# Patient Record
Sex: Female | Born: 1990 | Race: Black or African American | Hispanic: No | Marital: Single | Smoking: Current every day smoker
Health system: Southern US, Community
[De-identification: ages and names within clinical notes are randomized; demographics above are authoritative.]

## PROBLEM LIST (undated history)

## (undated) ENCOUNTER — Inpatient Hospital Stay (HOSPITAL_COMMUNITY): Payer: Self-pay

## (undated) ENCOUNTER — Inpatient Hospital Stay: Payer: Self-pay

## (undated) DIAGNOSIS — F329 Major depressive disorder, single episode, unspecified: Secondary | ICD-10-CM

## (undated) DIAGNOSIS — D649 Anemia, unspecified: Secondary | ICD-10-CM

## (undated) DIAGNOSIS — J45909 Unspecified asthma, uncomplicated: Secondary | ICD-10-CM

## (undated) DIAGNOSIS — F419 Anxiety disorder, unspecified: Secondary | ICD-10-CM

## (undated) DIAGNOSIS — L309 Dermatitis, unspecified: Secondary | ICD-10-CM

## (undated) DIAGNOSIS — F32A Depression, unspecified: Secondary | ICD-10-CM

## (undated) DIAGNOSIS — R519 Headache, unspecified: Secondary | ICD-10-CM

## (undated) DIAGNOSIS — D219 Benign neoplasm of connective and other soft tissue, unspecified: Secondary | ICD-10-CM

## (undated) HISTORY — PX: TONSILLECTOMY AND ADENOIDECTOMY: SUR1326

---
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
## 2011-02-24 ENCOUNTER — Emergency Department: Payer: Self-pay | Admitting: Emergency Medicine

## 2011-07-12 ENCOUNTER — Emergency Department: Payer: Self-pay | Admitting: Emergency Medicine

## 2011-09-10 ENCOUNTER — Emergency Department: Payer: Self-pay | Admitting: *Deleted

## 2011-09-10 LAB — PREGNANCY, URINE: Pregnancy Test, Urine: NEGATIVE m[IU]/mL

## 2013-11-08 ENCOUNTER — Emergency Department: Payer: Self-pay | Admitting: Emergency Medicine

## 2013-11-08 LAB — CBC WITH DIFFERENTIAL/PLATELET
BASOS ABS: 0.1 10*3/uL (ref 0.0–0.1)
BASOS PCT: 0.6 %
Eosinophil #: 0.3 10*3/uL (ref 0.0–0.7)
Eosinophil %: 2.5 %
HCT: 45.6 % (ref 35.0–47.0)
HGB: 14.8 g/dL (ref 12.0–16.0)
LYMPHS PCT: 33.8 %
Lymphocyte #: 3.5 10*3/uL (ref 1.0–3.6)
MCH: 31.2 pg (ref 26.0–34.0)
MCHC: 32.5 g/dL (ref 32.0–36.0)
MCV: 96 fL (ref 80–100)
MONOS PCT: 6.8 %
Monocyte #: 0.7 x10 3/mm (ref 0.2–0.9)
NEUTROS PCT: 56.3 %
Neutrophil #: 5.8 10*3/uL (ref 1.4–6.5)
Platelet: 259 10*3/uL (ref 150–440)
RBC: 4.76 10*6/uL (ref 3.80–5.20)
RDW: 13.8 % (ref 11.5–14.5)
WBC: 10.4 10*3/uL (ref 3.6–11.0)

## 2013-11-08 LAB — URINALYSIS, COMPLETE
BLOOD: NEGATIVE
Bacteria: NONE SEEN
Bilirubin,UR: NEGATIVE
Glucose,UR: NEGATIVE mg/dL (ref 0–75)
Ketone: NEGATIVE
LEUKOCYTE ESTERASE: NEGATIVE
Nitrite: NEGATIVE
Ph: 8 (ref 4.5–8.0)
Protein: NEGATIVE
RBC, UR: NONE SEEN /HPF (ref 0–5)
SPECIFIC GRAVITY: 1.023 (ref 1.003–1.030)
Squamous Epithelial: 4
WBC UR: <1 /HPF (ref 0–5)

## 2013-11-08 LAB — COMPREHENSIVE METABOLIC PANEL
ANION GAP: 4 — AB (ref 7–16)
Albumin: 3.9 g/dL (ref 3.4–5.0)
Alkaline Phosphatase: 112 U/L
BILIRUBIN TOTAL: 0.3 mg/dL (ref 0.2–1.0)
BUN: 20 mg/dL — ABNORMAL HIGH (ref 7–18)
CREATININE: 0.96 mg/dL (ref 0.60–1.30)
Calcium, Total: 8.8 mg/dL (ref 8.5–10.1)
Chloride: 106 mmol/L (ref 98–107)
Co2: 28 mmol/L (ref 21–32)
EGFR (African American): >60
Glucose: 97 mg/dL (ref 65–99)
Osmolality: 278 (ref 275–301)
Potassium: 3.8 mmol/L (ref 3.5–5.1)
SGOT(AST): 19 U/L (ref 15–37)
SGPT (ALT): 18 U/L (ref 12–78)
Sodium: 138 mmol/L (ref 136–145)
TOTAL PROTEIN: 8.4 g/dL — AB (ref 6.4–8.2)

## 2014-01-29 ENCOUNTER — Emergency Department: Payer: Self-pay | Admitting: Emergency Medicine

## 2014-02-13 ENCOUNTER — Emergency Department: Payer: Self-pay | Admitting: Emergency Medicine

## 2014-03-02 ENCOUNTER — Emergency Department: Payer: Self-pay | Admitting: Emergency Medicine

## 2014-03-02 LAB — COMPREHENSIVE METABOLIC PANEL
Albumin: 3.4 g/dL (ref 3.4–5.0)
Alkaline Phosphatase: 54 U/L
Anion Gap: 13 (ref 7–16)
BILIRUBIN TOTAL: 0.7 mg/dL (ref 0.2–1.0)
BUN: 8 mg/dL (ref 7–18)
Calcium, Total: 8.6 mg/dL (ref 8.5–10.1)
Chloride: 102 mmol/L (ref 98–107)
Co2: 21 mmol/L (ref 21–32)
Creatinine: 0.7 mg/dL (ref 0.60–1.30)
EGFR (African American): >60
EGFR (Non-African Amer.): >60
GLUCOSE: 83 mg/dL (ref 65–99)
Osmolality: 269 (ref 275–301)
POTASSIUM: 3.2 mmol/L — AB (ref 3.5–5.1)
SGOT(AST): 28 U/L (ref 15–37)
SGPT (ALT): 45 U/L
SODIUM: 136 mmol/L (ref 136–145)
TOTAL PROTEIN: 7.7 g/dL (ref 6.4–8.2)

## 2014-03-02 LAB — URINALYSIS, COMPLETE
BILIRUBIN, UR: NEGATIVE
Blood: NEGATIVE
Glucose,UR: NEGATIVE mg/dL (ref 0–75)
LEUKOCYTE ESTERASE: NEGATIVE
Nitrite: NEGATIVE
Ph: 5 (ref 4.5–8.0)
Protein: 30
RBC,UR: 4 /HPF (ref 0–5)
SPECIFIC GRAVITY: 1.03 (ref 1.003–1.030)
Squamous Epithelial: 5
WBC UR: 1 /HPF (ref 0–5)

## 2014-03-02 LAB — CBC WITH DIFFERENTIAL/PLATELET
Basophil #: 0.1 10*3/uL (ref 0.0–0.1)
Basophil %: 0.5 %
EOS ABS: 0.1 10*3/uL (ref 0.0–0.7)
Eosinophil %: 0.5 %
HCT: 40.8 % (ref 35.0–47.0)
HGB: 13.5 g/dL (ref 12.0–16.0)
LYMPHS PCT: 36.1 %
Lymphocyte #: 4.6 10*3/uL — ABNORMAL HIGH (ref 1.0–3.6)
MCH: 31.6 pg (ref 26.0–34.0)
MCHC: 33.2 g/dL (ref 32.0–36.0)
MCV: 96 fL (ref 80–100)
MONO ABS: 0.9 x10 3/mm (ref 0.2–0.9)
MONOS PCT: 6.7 %
NEUTROS PCT: 56.2 %
Neutrophil #: 7.1 10*3/uL — ABNORMAL HIGH (ref 1.4–6.5)
Platelet: 270 10*3/uL (ref 150–440)
RBC: 4.27 10*6/uL (ref 3.80–5.20)
RDW: 13 % (ref 11.5–14.5)
WBC: 12.7 10*3/uL — ABNORMAL HIGH (ref 3.6–11.0)

## 2014-03-02 LAB — LIPASE, BLOOD: Lipase: 256 U/L (ref 73–393)

## 2014-03-02 LAB — HCG, QUANTITATIVE, PREGNANCY: BETA HCG, QUANT.: 100517 m[IU]/mL — AB

## 2014-09-30 ENCOUNTER — Observation Stay: Payer: Self-pay | Admitting: Obstetrics and Gynecology

## 2014-10-03 ENCOUNTER — Inpatient Hospital Stay: Payer: Self-pay

## 2014-11-30 NOTE — H&P (Signed)
L&D Evaluation:  History:  HPI Pt is a 24 yo G1 at 38.[redacted] weeks GA with an EDC of 10/10/14 presents to L&D with reports of contractions that started last night. She reports +FM, denies vb or lof. 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 She  also has a history of past cocaine use, former smoker, depression with an EPDS in pregnancy of 13 and past thoughts of death but no plan, history of GC, CT, trich, and obesity with a BMI of 30. She is B+, VNI, RNI, GBS positive, and Tdap UTD.   Presents with contractions   Patient's Medical History Asthma   Patient's Surgical History T&A   Social History tobacco  EtOH  drugs  MJ, cocaine use in the past, past ETOH use with 2 beers QOD   Family History Non-Contributory   ROS:  ROS All systems were reviewed.  HEENT, CNS, GI, GU, Respiratory, CV, Renal and Musculoskeletal systems were found to be normal.   Exam:  Vital Signs stable   General appears in pain with contractions   Mental Status clear   Abdomen gravid, tender with contractions   Pelvic 3/85/-2 posterior upon admission- 1 hr later 3+/90/-1 mid   Mebranes Intact   FHT normal rate with no decels, 125-130, moderate variability, +accels, no decels   Ucx regular, Q2-3   Skin dry, no lesions, no rashes   Lymph no lymphadenopathy   Impression:  Impression reactive NST, IUP at 38.6, labor   Plan:  Plan monitor contractions and for cervical change, admit for labor, abx for GBS, IV pain medication, anticipate svd   Follow Up Appointment need to schedule. in 6 weeks   Electronic Signatures: Elainna, [place] (CNM)  (Signed 13-Mar-16 05:28)  Authored: L&D Evaluation   Last Updated: 13-Mar-16 05:28 by Louisa Second (CNM)

## 2015-03-03 DIAGNOSIS — R42 Dizziness and giddiness: Secondary | ICD-10-CM | POA: Insufficient documentation

## 2015-03-03 DIAGNOSIS — R197 Diarrhea, unspecified: Secondary | ICD-10-CM | POA: Insufficient documentation

## 2015-03-03 DIAGNOSIS — Z720 Tobacco use: Secondary | ICD-10-CM | POA: Insufficient documentation

## 2015-03-03 LAB — URINALYSIS COMPLETE WITH MICROSCOPIC (ARMC ONLY)
BACTERIA UA: NONE SEEN
Bilirubin Urine: NEGATIVE
Glucose, UA: NEGATIVE mg/dL
HGB URINE DIPSTICK: NEGATIVE
KETONES UR: NEGATIVE mg/dL
Nitrite: NEGATIVE
Protein, ur: 30 mg/dL — AB
SPECIFIC GRAVITY, URINE: 1.032 — AB (ref 1.005–1.030)
pH: 7 (ref 5.0–8.0)

## 2015-03-03 LAB — CBC
HEMATOCRIT: 42.2 % (ref 35.0–47.0)
Hemoglobin: 13.8 g/dL (ref 12.0–16.0)
MCH: 30.4 pg (ref 26.0–34.0)
MCHC: 32.7 g/dL (ref 32.0–36.0)
MCV: 93.1 fL (ref 80.0–100.0)
Platelets: 296 10*3/uL (ref 150–440)
RBC: 4.54 MIL/uL (ref 3.80–5.20)
RDW: 14.1 % (ref 11.5–14.5)
WBC: 8.3 10*3/uL (ref 3.6–11.0)

## 2015-03-03 LAB — BASIC METABOLIC PANEL
Anion gap: 7 (ref 5–15)
BUN: 10 mg/dL (ref 6–20)
CALCIUM: 9 mg/dL (ref 8.9–10.3)
CO2: 26 mmol/L (ref 22–32)
Chloride: 106 mmol/L (ref 101–111)
Creatinine, Ser: 0.83 mg/dL (ref 0.44–1.00)
GFR calc Af Amer: >60 mL/min (ref >60)
GLUCOSE: 101 mg/dL — AB (ref 65–99)
POTASSIUM: 3.3 mmol/L — AB (ref 3.5–5.1)
Sodium: 139 mmol/L (ref 135–145)

## 2015-03-03 NOTE — ED Notes (Signed)
Patient to ED via EMS for lightheadedness, diarrhea, and not feeling well. States she got hot and thinks that might have caused it. Patient able to speak in complete sentences without difficulty.

## 2015-03-04 ENCOUNTER — Emergency Department
Admission: EM | Admit: 2015-03-04 | Discharge: 2015-03-04 | Discharge status: Against Medical Advice | Payer: Self-pay | Attending: Emergency Medicine | Admitting: Emergency Medicine

## 2015-03-18 LAB — POCT PREGNANCY, URINE: Preg Test, Ur: NEGATIVE

## 2015-07-13 ENCOUNTER — Emergency Department
Admission: EM | Admit: 2015-07-13 | Discharge: 2015-07-13 | Disposition: A | Discharge status: Home / Self Care | Payer: Medicaid Other | Attending: Emergency Medicine | Admitting: Emergency Medicine

## 2015-07-13 DIAGNOSIS — F17200 Nicotine dependence, unspecified, uncomplicated: Secondary | ICD-10-CM | POA: Insufficient documentation

## 2015-07-13 DIAGNOSIS — R109 Unspecified abdominal pain: Secondary | ICD-10-CM | POA: Diagnosis not present

## 2015-07-13 DIAGNOSIS — Z3A08 8 weeks gestation of pregnancy: Secondary | ICD-10-CM | POA: Insufficient documentation

## 2015-07-13 DIAGNOSIS — O99331 Smoking (tobacco) complicating pregnancy, first trimester: Secondary | ICD-10-CM | POA: Diagnosis not present

## 2015-07-13 DIAGNOSIS — Z3491 Encounter for supervision of normal pregnancy, unspecified, first trimester: Secondary | ICD-10-CM

## 2015-07-13 DIAGNOSIS — O211 Hyperemesis gravidarum with metabolic disturbance: Secondary | ICD-10-CM | POA: Insufficient documentation

## 2015-07-13 DIAGNOSIS — O210 Mild hyperemesis gravidarum: Secondary | ICD-10-CM | POA: Diagnosis present

## 2015-07-13 DIAGNOSIS — O9989 Other specified diseases and conditions complicating pregnancy, childbirth and the puerperium: Secondary | ICD-10-CM | POA: Diagnosis not present

## 2015-07-13 LAB — COMPREHENSIVE METABOLIC PANEL
ALK PHOS: 60 U/L (ref 38–126)
ALT: 17 U/L (ref 14–54)
ANION GAP: 6 (ref 5–15)
AST: 14 U/L — ABNORMAL LOW (ref 15–41)
Albumin: 3.6 g/dL (ref 3.5–5.0)
BILIRUBIN TOTAL: 0.2 mg/dL — AB (ref 0.3–1.2)
BUN: 6 mg/dL (ref 6–20)
CHLORIDE: 106 mmol/L (ref 101–111)
CO2: 24 mmol/L (ref 22–32)
Calcium: 8.7 mg/dL — ABNORMAL LOW (ref 8.9–10.3)
Creatinine, Ser: 0.59 mg/dL (ref 0.44–1.00)
GLUCOSE: 98 mg/dL (ref 65–99)
POTASSIUM: 3.5 mmol/L (ref 3.5–5.1)
SODIUM: 136 mmol/L (ref 135–145)
Total Protein: 7.1 g/dL (ref 6.5–8.1)

## 2015-07-13 LAB — CBC
HEMATOCRIT: 38 % (ref 35.0–47.0)
Hemoglobin: 12.6 g/dL (ref 12.0–16.0)
MCH: 30.1 pg (ref 26.0–34.0)
MCHC: 33.1 g/dL (ref 32.0–36.0)
MCV: 90.9 fL (ref 80.0–100.0)
Platelets: 287 10*3/uL (ref 150–440)
RBC: 4.18 MIL/uL (ref 3.80–5.20)
RDW: 14 % (ref 11.5–14.5)
WBC: 7.4 10*3/uL (ref 3.6–11.0)

## 2015-07-13 LAB — URINALYSIS COMPLETE WITH MICROSCOPIC (ARMC ONLY)
Bilirubin Urine: NEGATIVE
Glucose, UA: NEGATIVE mg/dL
HGB URINE DIPSTICK: NEGATIVE
Ketones, ur: NEGATIVE mg/dL
Nitrite: NEGATIVE
Protein, ur: NEGATIVE mg/dL
Specific Gravity, Urine: 1.019 (ref 1.005–1.030)
pH: 6 (ref 5.0–8.0)

## 2015-07-13 LAB — HCG, QUANTITATIVE, PREGNANCY: hCG, Beta Chain, Quant, S: 145162 m[IU]/mL — ABNORMAL HIGH (ref <5)

## 2015-07-13 LAB — POCT PREGNANCY, URINE: Preg Test, Ur: POSITIVE — AB

## 2015-07-13 LAB — LIPASE, BLOOD: Lipase: 41 U/L (ref 11–51)

## 2015-07-13 MED ORDER — ONDANSETRON HCL 4 MG PO TABS: 4.0000 mg | ORAL_TABLET | Freq: Every day | ORAL | Status: DC | PRN

## 2015-07-13 MED ORDER — SODIUM CHLORIDE 0.9 % IV SOLN
Freq: Once | INTRAVENOUS | Status: AC
  Administered 2015-07-13: 07:00:00 via INTRAVENOUS

## 2015-07-13 MED ORDER — ONDANSETRON HCL 4 MG/2ML IJ SOLN
4.0000 mg | Freq: Once | INTRAMUSCULAR | Status: AC | PRN
  Administered 2015-07-13: 4 mg via INTRAVENOUS
  Filled 2015-07-13: qty 2

## 2015-07-13 NOTE — ED Notes (Signed)
Pt in with co vomiting x 2 weeks with abd pain.

## 2015-07-13 NOTE — ED Provider Notes (Signed)
[facility] Emergency Department Provider Note     Time seen: ----------------------------------------- 7:11 AM on 07/13/2015 -----------------------------------------    I have reviewed the triage vital signs and the nursing notes.   HISTORY  Chief Complaint Emesis    HPI Shirley Turner is a 24 y.o. female who presents ER with nausea vomiting and abdominal pain for 2 weeks. Patient feels dehydrated, states she last had her menstrual period in October. She is concerned she may be pregnant but hasn't had breakfast test. She currently is not month-old at home, states she is not using birth control.   No past medical history on file.  There are no active problems to display for this patient.   No past surgical history on file.  Allergies Review of patient's allergies indicates no known allergies.  Social History Social History  Substance Use Topics  . Smoking status: Current Every Day Smoker  . Smokeless tobacco: Not on file  . Alcohol Use: No    Review of Systems Constitutional: Negative for fever. Eyes: Negative for visual changes. ENT: Negative for sore throat. Cardiovascular: Negative for chest pain. Respiratory: Negative for shortness of breath. Gastrointestinal: Negative for abdominal pain, positive for vomiting Genitourinary: Negative for dysuria. Musculoskeletal: Negative for back pain. Skin: Negative for rash. Neurological: Negative for headaches, focal weakness or numbness.  10-point ROS otherwise negative.  ____________________________________________   PHYSICAL EXAM:  VITAL SIGNS: ED Triage Vitals  Enc Vitals Group     BP 07/13/15 0634 126/78 mmHg     Pulse Rate 07/13/15 0634 96     Resp 07/13/15 0634 18     Temp 07/13/15 0634 98 F (36.7 C)     Temp Source 07/13/15 0634 Oral     SpO2 07/13/15 0634 97 %     Weight 07/13/15 0634 160 lb (72.576 kg)     Height 07/13/15 0634 5\' 3"  (1.6 m)     Head Cir --      Peak  Flow --      Pain Score --      Pain Loc --      Pain Edu? --      Excl. in [place]? --     Constitutional: Alert and oriented. Well appearing and in no distress. Eyes: Conjunctivae are normal. PERRL. Normal extraocular movements. ENT   Head: Normocephalic and atraumatic.   Nose: No congestion/rhinnorhea.   Mouth/Throat: Mucous membranes are moist.   Neck: No stridor. Cardiovascular: Normal rate, regular rhythm. Normal and symmetric distal pulses are present in all extremities. No murmurs, rubs, or gallops. Respiratory: Normal respiratory effort without tachypnea nor retractions. Breath sounds are clear and equal bilaterally. No wheezes/rales/rhonchi. Gastrointestinal: Soft and nontender. No distention. No abdominal bruits.  Musculoskeletal: Nontender with normal range of motion in all extremities. No joint effusions.  No lower extremity tenderness nor edema. Neurologic:  Normal speech and language. No gross focal neurologic deficits are appreciated. Speech is normal. No gait instability. Skin:  Skin is warm, dry and intact. No rash noted. Psychiatric: Mood and affect are normal. Speech and behavior are normal. Patient exhibits appropriate insight and judgment. ____________________________________________  ED COURSE:  Pertinent labs & imaging results that were available during my care of the patient were reviewed by me and considered in my medical decision making (see chart for details). She is in no acute distress, will check basic labs and pregnancy test ____________________________________________    LABS (pertinent positives/negatives)  Labs Reviewed  COMPREHENSIVE METABOLIC PANEL - Abnormal; Notable  for the following:    Calcium 8.7 (*)    AST 14 (*)    Total Bilirubin 0.2 (*)    All other components within normal limits  URINALYSIS COMPLETEWITH MICROSCOPIC ([facility] ONLY) - Abnormal; Notable for the following:    Color, Urine YELLOW (*)    APPearance CLOUDY (*)     Leukocytes, UA TRACE (*)    Bacteria, UA RARE (*)    Squamous Epithelial / LPF 6-30 (*)    All other components within normal limits  HCG, QUANTITATIVE, PREGNANCY - Abnormal; Notable for the following:    hCG, Beta Chain, Quant, S [id number] (*)    All other components within normal limits  POCT PREGNANCY, URINE - Abnormal; Notable for the following:    Preg Test, Ur POSITIVE (*)    All other components within normal limits  LIPASE, BLOOD  CBC  POC URINE PREG, ED    ____________________________________________  FINAL ASSESSMENT AND PLAN  First trimester pregnancy  Plan: Patient with labs and imaging as dictated above. Patient with morning sickness due to pregnancy. She has a benign examination and unremarkable labs. Likely around [redacted] weeks pregnant. She'll be discharged with antiemetics and encouraged to have close follow-up with her doctor.   Earleen Newport, MD   Earleen Newport, MD 07/13/15 [phone]

## 2015-07-13 NOTE — Discharge Instructions (Signed)
First Trimester of Pregnancy  The first trimester of pregnancy is from week 1 until the end of week 12 (months 1 through 3). A week after a sperm fertilizes an egg, the egg will implant on the wall of the uterus. This embryo will begin to develop into a baby. Genes from you and your partner are forming the baby. The female genes determine whether the baby is a boy or a girl. At 6-8 weeks, the eyes and face are formed, and the heartbeat can be seen on ultrasound. At the end of 12 weeks, all the baby's organs are formed.   Now that you are pregnant, you will want to do everything you can to have a healthy baby. Two of the most important things are to get good prenatal care and to follow your health care provider's instructions. Prenatal care is all the medical care you receive before the baby's birth. This care will help prevent, find, and treat any problems during the pregnancy and childbirth.  BODY CHANGES  Your body goes through many changes during pregnancy. The changes vary from woman to woman.   · You may gain or lose a couple of pounds at first.  · You may feel sick to your stomach (nauseous) and throw up (vomit). If the vomiting is uncontrollable, call your health care provider.  · You may tire easily.  · You may develop headaches that can be relieved by medicines approved by your health care provider.  · You may urinate more often. Painful urination may mean you have a bladder infection.  · You may develop heartburn as a result of your pregnancy.  · You may develop constipation because certain hormones are causing the muscles that push waste through your intestines to slow down.  · You may develop hemorrhoids or swollen, bulging veins (varicose veins).  · Your breasts may begin to grow larger and become tender. Your nipples may stick out more, and the tissue that surrounds them (areola) may become darker.  · Your gums may bleed and may be sensitive to brushing and flossing.   · Dark spots or blotches (chloasma, mask of pregnancy) may develop on your face. This will likely fade after the baby is born.  · Your menstrual periods will stop.  · You may have a loss of appetite.  · You may develop cravings for certain kinds of food.  · You may have changes in your emotions from day to day, such as being excited to be pregnant or being concerned that something may go wrong with the pregnancy and baby.  · You may have more vivid and strange dreams.  · You may have changes in your hair. These can include thickening of your hair, rapid growth, and changes in texture. Some women also have hair loss during or after pregnancy, or hair that feels dry or thin. Your hair will most likely return to normal after your baby is born.  WHAT TO EXPECT AT YOUR PRENATAL VISITS  During a routine prenatal visit:  · You will be weighed to make sure you and the baby are growing normally.  · Your blood pressure will be taken.  · Your abdomen will be measured to track your baby's growth.  · The fetal heartbeat will be listened to starting around week 10 or 12 of your pregnancy.  · Test results from any previous visits will be discussed.  Your health care provider may ask you:  · How you are feeling.  · If you   including cigarettes, chewing tobacco, and electronic cigarettes. °· If you have any questions. °Other tests that may be performed during your first trimester include: °· Blood tests to find your blood type and to check for the presence of any previous infections. They will also be used to check for low iron levels (anemia) and Rh antibodies. Later in the pregnancy, blood tests for diabetes will be done along with other tests if problems develop. °· Urine tests to check for infections, diabetes, or protein in the urine. °· An ultrasound to confirm the proper growth  and development of the baby. °· An amniocentesis to check for possible genetic problems. °· Fetal screens for spina bifida and Down syndrome. °· You may need other tests to make sure you and the baby are doing well. °· HIV (human immunodeficiency virus) testing. Routine prenatal testing includes screening for HIV, unless you choose not to have this test. °HOME CARE INSTRUCTIONS  °Medicines °· Follow your health care provider's instructions regarding medicine use. Specific medicines may be either safe or unsafe to take during pregnancy. °· Take your prenatal vitamins as directed. °· If you develop constipation, try taking a stool softener if your health care provider approves. °Diet °· Eat regular, well-balanced meals. Choose a variety of foods, such as meat or vegetable-based protein, fish, milk and low-fat dairy products, vegetables, fruits, and whole grain breads and cereals. Your health care provider will help you determine the amount of weight gain that is right for you. °· Avoid raw meat and uncooked cheese. These carry germs that can cause birth defects in the baby. °· Eating four or five small meals rather than three large meals a day may help relieve nausea and vomiting. If you start to feel nauseous, eating a few soda crackers can be helpful. Drinking liquids between meals instead of during meals also seems to help nausea and vomiting. °· If you develop constipation, eat more high-fiber foods, such as fresh vegetables or fruit and whole grains. Drink enough fluids to keep your urine clear or pale yellow. °Activity and Exercise °· Exercise only as directed by your health care provider. Exercising will help you: °¨ Control your weight. °¨ Stay in shape. °¨ Be prepared for labor and delivery. °· Experiencing pain or cramping in the lower abdomen or low back is a good sign that you should stop exercising. Check with your health care provider before continuing normal exercises. °· Try to avoid standing for long  periods of time. Move your legs often if you must stand in one place for a long time. °· Avoid heavy lifting. °· Wear low-heeled shoes, and practice good posture. °· You may continue to have sex unless your health care provider directs you otherwise. °Relief of Pain or Discomfort °· Wear a good support bra for breast tenderness.   °· Take warm sitz baths to soothe any pain or discomfort caused by hemorrhoids. Use hemorrhoid cream if your health care provider approves.   °· Rest with your legs elevated if you have leg cramps or low back pain. °· If you develop varicose veins in your legs, wear support hose. Elevate your feet for 15 minutes, 3-4 times a day. Limit salt in your diet. °Prenatal Care °· Schedule your prenatal visits by the twelfth week of pregnancy. They are usually scheduled monthly at first, then more often in the last 2 months before delivery. °· Write down your questions. Take them to your prenatal visits. °· Keep all your prenatal visits as directed by your   health care provider. Safety  Wear your seat belt at all times when driving.  Make a list of emergency phone numbers, including numbers for family, friends, the hospital, and police and fire departments. General Tips  Ask your health care provider for a referral to a local prenatal education class. Begin classes no later than at the beginning of month 6 of your pregnancy.  Ask for help if you have counseling or nutritional needs during pregnancy. Your health care provider can offer advice or refer you to specialists for help with various needs.  Do not use hot tubs, steam rooms, or saunas.  Do not douche or use tampons or scented sanitary pads.  Do not cross your legs for long periods of time.  Avoid cat litter boxes and soil used by cats. These carry germs that can cause birth defects in the baby and possibly loss of the fetus by miscarriage or stillbirth.  Avoid all smoking, herbs, alcohol, and medicines not prescribed by  your health care provider. Chemicals in these affect the formation and growth of the baby.  Do not use any tobacco products, including cigarettes, chewing tobacco, and electronic cigarettes. If you need help quitting, ask your health care provider. You may receive counseling support and other resources to help you quit.  Schedule a dentist appointment. At home, brush your teeth with a soft toothbrush and be gentle when you floss. SEEK MEDICAL CARE IF:   You have dizziness.  You have mild pelvic cramps, pelvic pressure, or nagging pain in the abdominal area.  You have persistent nausea, vomiting, or diarrhea.  You have a bad smelling vaginal discharge.  You have pain with urination.  You notice increased swelling in your face, hands, legs, or ankles. SEEK IMMEDIATE MEDICAL CARE IF:   You have a fever.  You are leaking fluid from your vagina.  You have spotting or bleeding from your vagina.  You have severe abdominal cramping or pain.  You have rapid weight gain or loss.  You vomit blood or material that looks like coffee grounds.  You are exposed to [place] measles and have never had them.  You are exposed to fifth disease or chickenpox.  You develop a severe headache.  You have shortness of breath.  You have any kind of trauma, such as from a fall or a car accident.   This information is not intended to replace advice given to you by your health care provider. Make sure you discuss any questions you have with your health care provider.   Document Released: 07/03/2001 Document Revised: 07/30/2014 Document Reviewed: 05/19/2013 Elsevier Interactive Patient Education 2016 [place] of Pregnancy The first trimester of pregnancy is from week 1 until the end of week 12 (months 1 through 3). A week after a sperm fertilizes an egg, the egg will implant on the wall of the uterus. This embryo will begin to develop into a baby. Genes from you and your partner  are forming the baby. The female genes determine whether the baby is a boy or a girl. At 6-8 weeks, the eyes and face are formed, and the heartbeat can be seen on ultrasound. At the end of 12 weeks, all the baby's organs are formed.  Now that you are pregnant, you will want to do everything you can to have a healthy baby. Two of the most important things are to get good prenatal care and to follow your health care provider's instructions. Prenatal care is all the  medical care you receive before the baby's birth. This care will help prevent, find, and treat any problems during the pregnancy and childbirth. BODY CHANGES Your body goes through many changes during pregnancy. The changes vary from woman to woman.   You may gain or lose a couple of pounds at first.  You may feel sick to your stomach (nauseous) and throw up (vomit). If the vomiting is uncontrollable, call your health care provider.  You may tire easily.  You may develop headaches that can be relieved by medicines approved by your health care provider.  You may urinate more often. Painful urination may mean you have a bladder infection.  You may develop heartburn as a result of your pregnancy.  You may develop constipation because certain hormones are causing the muscles that push waste through your intestines to slow down.  You may develop hemorrhoids or swollen, bulging veins (varicose veins).  Your breasts may begin to grow larger and become tender. Your nipples may stick out more, and the tissue that surrounds them (areola) may become darker.  Your gums may bleed and may be sensitive to brushing and flossing.  Dark spots or blotches (chloasma, mask of pregnancy) may develop on your face. This will likely fade after the baby is born.  Your menstrual periods will stop.  You may have a loss of appetite.  You may develop cravings for certain kinds of food.  You may have changes in your emotions from day to day, such as being  excited to be pregnant or being concerned that something may go wrong with the pregnancy and baby.  You may have more vivid and strange dreams.  You may have changes in your hair. These can include thickening of your hair, rapid growth, and changes in texture. Some women also have hair loss during or after pregnancy, or hair that feels dry or thin. Your hair will most likely return to normal after your baby is born. WHAT TO EXPECT AT YOUR PRENATAL VISITS During a routine prenatal visit:  You will be weighed to make sure you and the baby are growing normally.  Your blood pressure will be taken.  Your abdomen will be measured to track your baby's growth.  The fetal heartbeat will be listened to starting around week 10 or 12 of your pregnancy.  Test results from any previous visits will be discussed. Your health care provider may ask you:  How you are feeling.  If you are feeling the baby move.  If you have had any abnormal symptoms, such as leaking fluid, bleeding, severe headaches, or abdominal cramping.  If you are using any tobacco products, including cigarettes, chewing tobacco, and electronic cigarettes.  If you have any questions. Other tests that may be performed during your first trimester include:  Blood tests to find your blood type and to check for the presence of any previous infections. They will also be used to check for low iron levels (anemia) and Rh antibodies. Later in the pregnancy, blood tests for diabetes will be done along with other tests if problems develop.  Urine tests to check for infections, diabetes, or protein in the urine.  An ultrasound to confirm the proper growth and development of the baby.  An amniocentesis to check for possible genetic problems.  Fetal screens for spina bifida and Down syndrome.  You may need other tests to make sure you and the baby are doing well.  HIV (human immunodeficiency virus) testing. Routine prenatal testing  includes  screening for HIV, unless you choose not to have this test. HOME CARE INSTRUCTIONS  Medicines  Follow your health care provider's instructions regarding medicine use. Specific medicines may be either safe or unsafe to take during pregnancy.  Take your prenatal vitamins as directed.  If you develop constipation, try taking a stool softener if your health care provider approves. Diet  Eat regular, well-balanced meals. Choose a variety of foods, such as meat or vegetable-based protein, fish, milk and low-fat dairy products, vegetables, fruits, and whole grain breads and cereals. Your health care provider will help you determine the amount of weight gain that is right for you.  Avoid raw meat and uncooked cheese. These carry germs that can cause birth defects in the baby.  Eating four or five small meals rather than three large meals a day may help relieve nausea and vomiting. If you start to feel nauseous, eating a few soda crackers can be helpful. Drinking liquids between meals instead of during meals also seems to help nausea and vomiting.  If you develop constipation, eat more high-fiber foods, such as fresh vegetables or fruit and whole grains. Drink enough fluids to keep your urine clear or pale yellow. Activity and Exercise  Exercise only as directed by your health care provider. Exercising will help you:  Control your weight.  Stay in shape.  Be prepared for labor and delivery.  Experiencing pain or cramping in the lower abdomen or low back is a good sign that you should stop exercising. Check with your health care provider before continuing normal exercises.  Try to avoid standing for long periods of time. Move your legs often if you must stand in one place for a long time.  Avoid heavy lifting.  Wear low-heeled shoes, and practice good posture.  You may continue to have sex unless your health care provider directs you otherwise. Relief of Pain or Discomfort  Wear  a good support bra for breast tenderness.   Take warm sitz baths to soothe any pain or discomfort caused by hemorrhoids. Use hemorrhoid cream if your health care provider approves.   Rest with your legs elevated if you have leg cramps or low back pain.  If you develop varicose veins in your legs, wear support hose. Elevate your feet for 15 minutes, 3-4 times a day. Limit salt in your diet. Prenatal Care  Schedule your prenatal visits by the twelfth week of pregnancy. They are usually scheduled monthly at first, then more often in the last 2 months before delivery.  Write down your questions. Take them to your prenatal visits.  Keep all your prenatal visits as directed by your health care provider. Safety  Wear your seat belt at all times when driving.  Make a list of emergency phone numbers, including numbers for family, friends, the hospital, and police and fire departments. General Tips  Ask your health care provider for a referral to a local prenatal education class. Begin classes no later than at the beginning of month 6 of your pregnancy.  Ask for help if you have counseling or nutritional needs during pregnancy. Your health care provider can offer advice or refer you to specialists for help with various needs.  Do not use hot tubs, steam rooms, or saunas.  Do not douche or use tampons or scented sanitary pads.  Do not cross your legs for long periods of time.  Avoid cat litter boxes and soil used by cats. These carry germs that can cause birth defects in  the baby and possibly loss of the fetus by miscarriage or stillbirth.  Avoid all smoking, herbs, alcohol, and medicines not prescribed by your health care provider. Chemicals in these affect the formation and growth of the baby.  Do not use any tobacco products, including cigarettes, chewing tobacco, and electronic cigarettes. If you need help quitting, ask your health care provider. You may receive counseling support and  other resources to help you quit.  Schedule a dentist appointment. At home, brush your teeth with a soft toothbrush and be gentle when you floss. SEEK MEDICAL CARE IF:   You have dizziness.  You have mild pelvic cramps, pelvic pressure, or nagging pain in the abdominal area.  You have persistent nausea, vomiting, or diarrhea.  You have a bad smelling vaginal discharge.  You have pain with urination.  You notice increased swelling in your face, hands, legs, or ankles. SEEK IMMEDIATE MEDICAL CARE IF:   You have a fever.  You are leaking fluid from your vagina.  You have spotting or bleeding from your vagina.  You have severe abdominal cramping or pain.  You have rapid weight gain or loss.  You vomit blood or material that looks like coffee grounds.  You are exposed to [place] measles and have never had them.  You are exposed to fifth disease or chickenpox.  You develop a severe headache.  You have shortness of breath.  You have any kind of trauma, such as from a fall or a car accident.   This information is not intended to replace advice given to you by your health care provider. Make sure you discuss any questions you have with your health care provider.   Document Released: 07/03/2001 Document Revised: 07/30/2014 Document Reviewed: 05/19/2013 Elsevier Interactive Patient Education [employer].

## 2015-07-24 NOTE — L&D Delivery Note (Addendum)
Delivery Note At 4:30 PM a viable female was delivered via Vaginal, Spontaneous Delivery (Presentation: OA, Left Occiput Anterior).  APGAR: 8, 9; weight: 2200.   Placenta status: Intact, Spontaneous.  Cord:  with the following complications: none .  Cord pH: NA  Called to see patient.  Cervical lip reduced and mom pushed to delivery viable female infant.  The head followed by shoulders, which delivered without difficulty, and the rest of the body.  Nuchal cord/body cord reduced following delivery of the baby.  Baby to mom's chest.  Cord clamped and cut after > 1 min delay.  Cord blood obtained.  Placenta delivered spontaneously, intact, with a 3-vessel cord.  Perineum intact.  All counts correct.  Hemostasis obtained with IV pitocin and fundal massage. EBL 150 mL.    Anesthesia: Epidural  Episiotomy: None Lacerations: None Suture Repair: NA Est. Blood Loss (mL): 150   Mom to postpartum.  Baby to Couplet care / Skin to Skin.   Rod Can, CNM

## 2015-07-30 ENCOUNTER — Emergency Department
Admission: EM | Admit: 2015-07-30 | Discharge: 2015-07-30 | Disposition: A | Discharge status: Home / Self Care | Payer: Medicaid Other | Attending: Emergency Medicine | Admitting: Emergency Medicine

## 2015-07-30 ENCOUNTER — Emergency Department: Payer: Medicaid Other

## 2015-07-30 ENCOUNTER — Encounter: Payer: Self-pay | Admitting: *Deleted

## 2015-07-30 DIAGNOSIS — R531 Weakness: Secondary | ICD-10-CM | POA: Insufficient documentation

## 2015-07-30 DIAGNOSIS — O418X10 Other specified disorders of amniotic fluid and membranes, first trimester, not applicable or unspecified: Secondary | ICD-10-CM | POA: Diagnosis not present

## 2015-07-30 DIAGNOSIS — Z3A11 11 weeks gestation of pregnancy: Secondary | ICD-10-CM | POA: Diagnosis not present

## 2015-07-30 DIAGNOSIS — F17200 Nicotine dependence, unspecified, uncomplicated: Secondary | ICD-10-CM | POA: Diagnosis not present

## 2015-07-30 DIAGNOSIS — O23591 Infection of other part of genital tract in pregnancy, first trimester: Secondary | ICD-10-CM | POA: Insufficient documentation

## 2015-07-30 DIAGNOSIS — O9989 Other specified diseases and conditions complicating pregnancy, childbirth and the puerperium: Secondary | ICD-10-CM | POA: Diagnosis present

## 2015-07-30 DIAGNOSIS — O210 Mild hyperemesis gravidarum: Secondary | ICD-10-CM | POA: Diagnosis not present

## 2015-07-30 DIAGNOSIS — O468X1 Other antepartum hemorrhage, first trimester: Secondary | ICD-10-CM

## 2015-07-30 DIAGNOSIS — N760 Acute vaginitis: Secondary | ICD-10-CM

## 2015-07-30 DIAGNOSIS — B9689 Other specified bacterial agents as the cause of diseases classified elsewhere: Secondary | ICD-10-CM

## 2015-07-30 DIAGNOSIS — O99331 Smoking (tobacco) complicating pregnancy, first trimester: Secondary | ICD-10-CM | POA: Diagnosis not present

## 2015-07-30 DIAGNOSIS — R1032 Left lower quadrant pain: Secondary | ICD-10-CM

## 2015-07-30 LAB — BASIC METABOLIC PANEL
Anion gap: 10 (ref 5–15)
BUN: 6 mg/dL (ref 6–20)
CALCIUM: 9.2 mg/dL (ref 8.9–10.3)
CO2: 21 mmol/L — AB (ref 22–32)
Chloride: 102 mmol/L (ref 101–111)
Creatinine, Ser: 0.71 mg/dL (ref 0.44–1.00)
GFR calc non Af Amer: >60 mL/min (ref >60)
Glucose, Bld: 84 mg/dL (ref 65–99)
Potassium: 3.2 mmol/L — ABNORMAL LOW (ref 3.5–5.1)
Sodium: 133 mmol/L — ABNORMAL LOW (ref 135–145)

## 2015-07-30 LAB — URINALYSIS COMPLETE WITH MICROSCOPIC (ARMC ONLY)
BACTERIA UA: NONE SEEN
Bilirubin Urine: NEGATIVE
Glucose, UA: NEGATIVE mg/dL
HGB URINE DIPSTICK: NEGATIVE
LEUKOCYTES UA: NEGATIVE
Nitrite: NEGATIVE
Protein, ur: NEGATIVE mg/dL
Specific Gravity, Urine: 1.017 (ref 1.005–1.030)
WBC, UA: NONE SEEN WBC/hpf (ref 0–5)
pH: 8 (ref 5.0–8.0)

## 2015-07-30 LAB — WET PREP, GENITAL
SPERM: NONE SEEN
TRICH WET PREP: NONE SEEN
Yeast Wet Prep HPF POC: NONE SEEN

## 2015-07-30 LAB — CBC
HCT: 40.4 % (ref 35.0–47.0)
Hemoglobin: 13.7 g/dL (ref 12.0–16.0)
MCH: 30.6 pg (ref 26.0–34.0)
MCHC: 34 g/dL (ref 32.0–36.0)
MCV: 89.8 fL (ref 80.0–100.0)
Platelets: 335 10*3/uL (ref 150–440)
RBC: 4.5 MIL/uL (ref 3.80–5.20)
RDW: 13.7 % (ref 11.5–14.5)
WBC: 9.1 10*3/uL (ref 3.6–11.0)

## 2015-07-30 LAB — HCG, QUANTITATIVE, PREGNANCY: hCG, Beta Chain, Quant, S: 175157 m[IU]/mL — ABNORMAL HIGH (ref <5)

## 2015-07-30 LAB — CHLAMYDIA/NGC RT PCR (ARMC ONLY)
CHLAMYDIA TR: NOT DETECTED
N gonorrhoeae: NOT DETECTED

## 2015-07-30 LAB — POCT PREGNANCY, URINE: Preg Test, Ur: POSITIVE — AB

## 2015-07-30 MED ORDER — METRONIDAZOLE 500 MG PO TABS: 500.0000 mg | ORAL_TABLET | Freq: Two times a day (BID) | ORAL | Status: DC

## 2015-07-30 MED ORDER — SODIUM CHLORIDE 0.9 % IV BOLUS (SEPSIS)
1000.0000 mL | Freq: Once | INTRAVENOUS | Status: AC
Start: 2015-07-30 — End: 2015-07-30
  Administered 2015-07-30: 1000 mL via INTRAVENOUS

## 2015-07-30 MED ORDER — METRONIDAZOLE 500 MG PO TABS
500.0000 mg | ORAL_TABLET | Freq: Once | ORAL | Status: AC
  Administered 2015-07-30: 500 mg via ORAL
  Filled 2015-07-30: qty 1

## 2015-07-30 MED ORDER — ONDANSETRON HCL 4 MG/2ML IJ SOLN
4.0000 mg | Freq: Once | INTRAMUSCULAR | Status: AC
  Administered 2015-07-30: 4 mg via INTRAVENOUS
  Filled 2015-07-30: qty 2

## 2015-07-30 MED ORDER — POTASSIUM CHLORIDE CRYS ER 20 MEQ PO TBCR
40.0000 meq | EXTENDED_RELEASE_TABLET | Freq: Once | ORAL | Status: AC
  Administered 2015-07-30: 40 meq via ORAL
  Filled 2015-07-30: qty 2

## 2015-07-30 MED ORDER — ACETAMINOPHEN 500 MG PO TABS
1000.0000 mg | ORAL_TABLET | Freq: Once | ORAL | Status: AC
  Administered 2015-07-30: 1000 mg via ORAL
  Filled 2015-07-30: qty 2

## 2015-07-30 NOTE — ED Notes (Signed)
Pt voiced that she was "super depressed" at this time. Pt denies SI/HI. MD made aware. Per MD ok to D/C.

## 2015-07-30 NOTE — ED Notes (Signed)
NAD noted at time of D/C. Pt denies questions or concerns. Pt ambulatory to the lobby at this time.  

## 2015-07-30 NOTE — ED Notes (Signed)
Patient transported to Ultrasound 

## 2015-07-30 NOTE — ED Provider Notes (Signed)
[facility] Emergency Department Provider Note  ____________________________________________  Time seen: Approximately 3:48 PM  I have reviewed the triage vital signs and the nursing notes.   HISTORY  Chief Complaint Abdominal Pain    HPI Shirley Turner is a 25 y.o. female G2P1 approx [redacted] wks pregnant by LMP, remote hx of GC/chlamydia, presenting w/ 1 wk of LLQ pain and change in vaginal discharge.  Patient reports that for the last week she has been having a "ball" kind of pain in the left lower quadrant associated with "white milky discharge." She has not hadany vaginal bleeding, gush of fluid, lightheadedness or syncope. She has not yet initiated obstetrics care. She does report multiple daily episodes of nausea and vomiting, which she had with her last pregnancy as well as generalized fatigue and weakness.   No past medical history on file.  There are no active problems to display for this patient.   No past surgical history on file.  Current Outpatient Rx  Name  Route  Sig  Dispense  Refill  . albuterol (PROVENTIL HFA;VENTOLIN HFA) 108 (90 BASE) MCG/ACT inhaler   Inhalation   Inhale 2 puffs into the lungs every 6 (six) hours as needed for wheezing or shortness of breath.         . metroNIDAZOLE (FLAGYL) 500 MG tablet   Oral   Take 1 tablet (500 mg total) by mouth 2 (two) times daily.   14 tablet   0   . ondansetron (ZOFRAN) 4 MG tablet   Oral   Take 1 tablet (4 mg total) by mouth daily as needed for nausea or vomiting.   20 tablet   1     Allergies Review of patient's allergies indicates no known allergies.  No family history on file.  Social History Social History  Substance Use Topics  . Smoking status: Current Every Day Smoker  . Smokeless tobacco: None  . Alcohol Use: No    Review of Systems Constitutional: No fever/chills. + generalized weakness. Eyes: No visual changes. ENT: No sore throat. Cardiovascular: Denies  chest pain, palpitations. Respiratory: Denies shortness of breath.  No cough. Gastrointestinal: Positive left lower quadrant abdominal pain.  No nausea, no vomiting.  No diarrhea.  No constipation. Genitourinary: Negative for dysuria. Positive change in vaginal discharge. Negative for vaginal bleeding. Musculoskeletal: Negative for back pain. Skin: Negative for rash. Neurological: Negative for headaches, focal weakness or numbness.  10-point ROS otherwise negative.  ____________________________________________   PHYSICAL EXAM:  VITAL SIGNS: ED Triage Vitals  Enc Vitals Group     BP 07/30/15 1516 143/124 mmHg     Pulse Rate 07/30/15 1516 90     Resp 07/30/15 1516 20     Temp 07/30/15 1516 98.3 F (36.8 C)     Temp Source 07/30/15 1516 Oral     SpO2 07/30/15 1516 99 %     Weight 07/30/15 1516 160 lb (72.576 kg)     Height 07/30/15 1516 5\' 2"  (1.575 m)     Head Cir --      Peak Flow --      Pain Score 07/30/15 1516 10     Pain Loc --      Pain Edu? --      Excl. in [place]? --     Constitutional: Alert and oriented. Tearful during examination Answer question appropriately. Eyes: Conjunctivae are normal.  EOMI. no scleral icterus Head: Atraumatic. Nose: No congestion/rhinnorhea. Mouth/Throat: Mucous membranes are moist.  Neck: No  stridor.  Supple.   Cardiovascular: Normal rate, regular rhythm. No murmurs, rubs or gallops.  Respiratory: Normal respiratory effort.  No retractions. Lungs CTAB.  No wheezes, rales or ronchi. Gastrointestinal: Abdomen is soft, nontender nondistended. No guarding or rebound. No peritoneal signs. Genitourinary: Normal-appearing external genitalia without lesions. Speculum exam reveals normal-appearing cervix, moderate and whitedischarge. Bimanual exam reveals no CMT, no adnexal masses or tenderness, no suprapubic pain. Musculoskeletal: No LE edema.  Neurologic:  Normal speech and language. No gross focal neurologic deficits are appreciated.  Skin:  Skin  is warm, dry and intact. No rash noted. Psychiatric: Mood and affect are normal. Speech and behavior are normal.  Normal judgement.  ____________________________________________   LABS (all labs ordered are listed, but only abnormal results are displayed)  Labs Reviewed  WET PREP, GENITAL - Abnormal; Notable for the following:    Clue Cells Wet Prep HPF POC FEW (*)    WBC, Wet Prep HPF POC FEW (*)    All other components within normal limits  URINALYSIS COMPLETEWITH MICROSCOPIC ([facility] ONLY) - Abnormal; Notable for the following:    Color, Urine YELLOW (*)    APPearance HAZY (*)    Ketones, ur 2+ (*)    Squamous Epithelial / LPF 0-5 (*)    All other components within normal limits  HCG, QUANTITATIVE, PREGNANCY - Abnormal; Notable for the following:    hCG, Beta Chain, Quant, S [id number] (*)    All other components within normal limits  BASIC METABOLIC PANEL - Abnormal; Notable for the following:    Sodium 133 (*)    Potassium 3.2 (*)    CO2 21 (*)    All other components within normal limits  POCT PREGNANCY, URINE - Abnormal; Notable for the following:    Preg Test, Ur POSITIVE (*)    All other components within normal limits  CHLAMYDIA/NGC RT PCR ([facility] ONLY)  CBC  POC URINE PREG, ED   ____________________________________________  EKG  Not Indicated ____________________________________________  RADIOLOGY  US Ob Comp Less 14 Wks  07/30/2015  CLINICAL DATA:  Left lower abdominal pain for 2 days. Pregnant patient. Patient is [redacted] weeks pregnant based on her last menstrual period. Beta HCG level, 175,157. EXAM: OBSTETRIC <14 WK [place] AND TRANSVAGINAL OB US TECHNIQUE: Both transabdominal and transvaginal ultrasound examinations were performed for complete evaluation of the gestation as well as the maternal uterus, adnexal regions, and pelvic cul-de-sac. Transvaginal technique was performed to assess early pregnancy. COMPARISON:  None. FINDINGS: Intrauterine gestational sac:  Visualized/normal in shape. Yolk sac:  Yes Embryo:  Yes Cardiac Activity: Yes Heart Rate: 147  bpm CRL:  48.4  mm   11 w   4 d                  [place] EDC: 02/14/2016 Subchorionic hemorrhage: Small subchronic hemorrhage measuring approximately 2.9 x 1.9 x 2.8 cm Maternal uterus/adnexae: Ovaries and adnexa are unremarkable. No pelvic free fluid. No uterine masses. IMPRESSION: 1. Single live intrauterine pregnancy with a measured gestational age of [redacted] weeks and 4 days. 2. Small subchronic hemorrhage.  No other pregnancy complication. 3. Ovaries and adnexa are unremarkable. Electronically Signed   By: Lajean Manes M.D.   On: 07/30/2015 18:16   US Ob Transvaginal  07/30/2015  CLINICAL DATA:  Left lower abdominal pain for 2 days. Pregnant patient. Patient is [redacted] weeks pregnant based on her last menstrual period. Beta HCG level, 175,157. EXAM: OBSTETRIC <14 WK [place] AND TRANSVAGINAL OB US TECHNIQUE: Both  transabdominal and transvaginal ultrasound examinations were performed for complete evaluation of the gestation as well as the maternal uterus, adnexal regions, and pelvic cul-de-sac. Transvaginal technique was performed to assess early pregnancy. COMPARISON:  None. FINDINGS: Intrauterine gestational sac: Visualized/normal in shape. Yolk sac:  Yes Embryo:  Yes Cardiac Activity: Yes Heart Rate: 147  bpm CRL:  48.4  mm   11 w   4 d                  [place] EDC: 02/14/2016 Subchorionic hemorrhage: Small subchronic hemorrhage measuring approximately 2.9 x 1.9 x 2.8 cm Maternal uterus/adnexae: Ovaries and adnexa are unremarkable. No pelvic free fluid. No uterine masses. IMPRESSION: 1. Single live intrauterine pregnancy with a measured gestational age of [redacted] weeks and 4 days. 2. Small subchronic hemorrhage.  No other pregnancy complication. 3. Ovaries and adnexa are unremarkable. Electronically Signed   By: Lajean Manes M.D.   On: 07/30/2015 18:16    ____________________________________________   PROCEDURES  Procedure(s)  performed: None  Critical Care performed: No ____________________________________________   INITIAL IMPRESSION / ASSESSMENT AND PLAN / ED COURSE  Pertinent labs & imaging results that were available during my care of the patient were reviewed by me and considered in my medical decision making (see chart for details).  25 y.o. G2P1 approx [redacted]wks pregnant presenting w/ llq pain and vaginal discharge.  Plan to r/o ectopic given STD hx, eval for STI, r/o UTI.  Initiate symptomatic tx.  ----------------------------------------- 6:41 PM on 07/30/2015 -----------------------------------------  The patient has Trail vaginosis and will be treated for this. She also has a small subchorionic hemorrhage without any vaginal bleeding; the patient is aware of this finding and will make an appointment with the OB/GYN for follow-up. Plan for discharge. Return precautions as well as follow-up instructions were discussed.  ____________________________________________  FINAL CLINICAL IMPRESSION(S) / ED DIAGNOSES  Final diagnoses:  Bacterial vaginosis  Subchorionic hemorrhage in first trimester      NEW MEDICATIONS STARTED DURING THIS VISIT:  New Prescriptions   METRONIDAZOLE (FLAGYL) 500 MG TABLET    Take 1 tablet (500 mg total) by mouth 2 (two) times daily.     Eula Listen, MD 07/30/15 1844

## 2015-07-30 NOTE — Discharge Instructions (Signed)
Please drink any of fluid to stay well hydrated and take the entire course of Flagyl, even if you're feeling better. Please make an appointment with Dr. Kenton Kingfisher to follow-up on your symptoms, as well as to have your subchorionic hemorrhage followed.  Please return to the emergency department if he developed vaginal bleeding, severe pain, fever, inability to keep down fluids, or any other symptoms concerning to you.  Bacterial Vaginosis Bacterial vaginosis is an infection of the vagina. It happens when too many germs (bacteria) grow in the vagina. Having this infection puts you at risk for getting other infections from sex. Treating this infection can help lower your risk for other infections, such as:   Chlamydia.  Gonorrhea.  HIV.  Herpes. HOME CARE  Take your medicine as told by your doctor.  Finish your medicine even if you start to feel better.  Tell your sex partner that you have an infection. They should see their doctor for treatment.  During treatment:  Avoid sex or use condoms correctly.  Do not douche.  Do not drink alcohol unless your doctor tells you it is ok.  Do not breastfeed unless your doctor tells you it is ok. GET HELP IF:  You are not getting better after 3 days of treatment.  You have more grey fluid (discharge) coming from your vagina than before.  You have more pain than before.  You have a fever. MAKE SURE YOU:   Understand these instructions.  Will watch your condition.  Will get help right away if you are not doing well or get worse.   This information is not intended to replace advice given to you by your health care provider. Make sure you discuss any questions you have with your health care provider.   Document Released: 04/17/2008 Document Revised: 07/30/2014 Document Reviewed: 02/18/2013 Elsevier Interactive Patient Education [employer].

## 2015-07-30 NOTE — ED Notes (Addendum)
Pt has left side abd pain.  Pt is approx  [redacted] weeks pregnant.  No vag bleeding.  Denies urinary sx.

## 2015-08-30 DIAGNOSIS — Z6828 Body mass index (BMI) 28.0-28.9, adult: Secondary | ICD-10-CM | POA: Insufficient documentation

## 2016-01-04 ENCOUNTER — Encounter: Payer: Self-pay | Admitting: *Deleted

## 2016-01-04 ENCOUNTER — Emergency Department: Payer: Medicaid Other

## 2016-01-04 ENCOUNTER — Inpatient Hospital Stay
Admission: EM | Admit: 2016-01-04 | Discharge: 2016-01-04 | Disposition: A | Discharge status: Nursing Home (Includes ICF) | Payer: Medicaid Other | Attending: Advanced Practice Midwife | Admitting: Advanced Practice Midwife

## 2016-01-04 ENCOUNTER — Encounter: Payer: Self-pay | Admitting: Emergency Medicine

## 2016-01-04 ENCOUNTER — Emergency Department
Admission: EM | Admit: 2016-01-04 | Discharge: 2016-01-04 | Disposition: A | Discharge status: Home / Self Care | Payer: Medicaid Other | Attending: Emergency Medicine | Admitting: Emergency Medicine

## 2016-01-04 DIAGNOSIS — Z79899 Other long term (current) drug therapy: Secondary | ICD-10-CM | POA: Diagnosis not present

## 2016-01-04 DIAGNOSIS — J4522 Mild intermittent asthma with status asthmaticus: Secondary | ICD-10-CM | POA: Insufficient documentation

## 2016-01-04 DIAGNOSIS — F1290 Cannabis use, unspecified, uncomplicated: Secondary | ICD-10-CM | POA: Insufficient documentation

## 2016-01-04 DIAGNOSIS — F17210 Nicotine dependence, cigarettes, uncomplicated: Secondary | ICD-10-CM | POA: Insufficient documentation

## 2016-01-04 DIAGNOSIS — R0602 Shortness of breath: Secondary | ICD-10-CM | POA: Diagnosis present

## 2016-01-04 DIAGNOSIS — J45901 Unspecified asthma with (acute) exacerbation: Secondary | ICD-10-CM

## 2016-01-04 HISTORY — DX: Unspecified asthma, uncomplicated: J45.909

## 2016-01-04 HISTORY — DX: Anxiety disorder, unspecified: F41.9

## 2016-01-04 LAB — CBC WITH DIFFERENTIAL/PLATELET
BASOS ABS: 0 10*3/uL (ref 0–0.1)
BASOS PCT: 0 %
EOS PCT: 1 %
Eosinophils Absolute: 0.1 10*3/uL (ref 0–0.7)
HEMATOCRIT: 30 % — AB (ref 35.0–47.0)
Hemoglobin: 10.3 g/dL — ABNORMAL LOW (ref 12.0–16.0)
Lymphocytes Relative: 24 %
Lymphs Abs: 2.3 10*3/uL (ref 1.0–3.6)
MCH: 31.8 pg (ref 26.0–34.0)
MCHC: 34.5 g/dL (ref 32.0–36.0)
MCV: 92.2 fL (ref 80.0–100.0)
MONO ABS: 0.6 10*3/uL (ref 0.2–0.9)
Monocytes Relative: 7 %
NEUTROS ABS: 6.6 10*3/uL — AB (ref 1.4–6.5)
Neutrophils Relative %: 68 %
PLATELETS: 284 10*3/uL (ref 150–440)
RBC: 3.25 MIL/uL — AB (ref 3.80–5.20)
RDW: 13.9 % (ref 11.5–14.5)
WBC: 9.7 10*3/uL (ref 3.6–11.0)

## 2016-01-04 LAB — BASIC METABOLIC PANEL
ANION GAP: 10 (ref 5–15)
BUN: 6 mg/dL (ref 6–20)
CALCIUM: 8.5 mg/dL — AB (ref 8.9–10.3)
CO2: 21 mmol/L — ABNORMAL LOW (ref 22–32)
Chloride: 107 mmol/L (ref 101–111)
Creatinine, Ser: 0.56 mg/dL (ref 0.44–1.00)
Glucose, Bld: 89 mg/dL (ref 65–99)
Potassium: 3.2 mmol/L — ABNORMAL LOW (ref 3.5–5.1)
Sodium: 138 mmol/L (ref 135–145)

## 2016-01-04 MED ORDER — METHYLPREDNISOLONE SODIUM SUCC 125 MG IJ SOLR
125.0000 mg | Freq: Once | INTRAMUSCULAR | Status: AC
  Administered 2016-01-04: 125 mg via INTRAVENOUS
  Filled 2016-01-04: qty 2

## 2016-01-04 MED ORDER — IPRATROPIUM-ALBUTEROL 0.5-2.5 (3) MG/3ML IN SOLN
3.0000 mL | Freq: Once | RESPIRATORY_TRACT | Status: AC
  Administered 2016-01-04: 3 mL via RESPIRATORY_TRACT

## 2016-01-04 MED ORDER — ALBUTEROL SULFATE HFA 108 (90 BASE) MCG/ACT IN AERS: 2.0000 | INHALATION_SPRAY | Freq: Four times a day (QID) | RESPIRATORY_TRACT | Status: DC | PRN

## 2016-01-04 MED ORDER — ACETAMINOPHEN 325 MG PO TABS
650.0000 mg | ORAL_TABLET | Freq: Once | ORAL | Status: AC
  Administered 2016-01-04: 650 mg via ORAL

## 2016-01-04 MED ORDER — IPRATROPIUM-ALBUTEROL 0.5-2.5 (3) MG/3ML IN SOLN
RESPIRATORY_TRACT | Status: AC
  Administered 2016-01-04: 3 mL via RESPIRATORY_TRACT
  Filled 2016-01-04: qty 9

## 2016-01-04 MED ORDER — PREDNISONE 20 MG PO TABS: 40.0000 mg | ORAL_TABLET | Freq: Every day | ORAL | Status: DC

## 2016-01-04 MED ORDER — ACETAMINOPHEN 325 MG PO TABS
ORAL_TABLET | ORAL | Status: AC
  Filled 2016-01-04: qty 2

## 2016-01-04 NOTE — ED Notes (Signed)
States she developed some SOB with wheezing over the past few days   Has had min relief with svn  Denies any fever or n/v  Pt is 33 weeks preg and denies any complaint with her preg.

## 2016-01-04 NOTE — ED Notes (Signed)
After breathing txs pt appears to be resting in bed more comfortable, states she feels better

## 2016-01-04 NOTE — ED Provider Notes (Signed)
[facility] Emergency Department Provider Note    ____________________________________________  Time seen: ~1000  I have reviewed the triage vital signs and the nursing notes.   HISTORY  Chief Complaint Shortness of Breath   History limited by: Not Limited   HPI [name] is a 25 y.o. female at [redacted] weeks pregnant who presents to the emergency department today because of shortness of breath. Additionally the patient was brought to the hospital via EMS because of abdominal pain and was sent to labor and delivery. While there she developed shortness of breath. She says that yesterday she also had an episode of shortness of breath and called EMS. She was given DuoNeb treatments and felt better. She does have a history of asthma. She has associated chest tightness. She has not noticed any unilateral leg swelling. She does not have a history of blood clots however is a smoker. She denies any recent fevers.   Past Medical History  Diagnosis Date  . Anxiety   . Asthma     There are no active problems to display for this patient.   Past Surgical History  Procedure Laterality Date  . Tonsillectomy and adenoidectomy      Current Outpatient Rx  Name  Route  Sig  Dispense  Refill  . albuterol (PROVENTIL HFA;VENTOLIN HFA) 108 (90 BASE) MCG/ACT inhaler   Inhalation   Inhale 2 puffs into the lungs every 6 (six) hours as needed for wheezing or shortness of breath.         . metroNIDAZOLE (FLAGYL) 500 MG tablet   Oral   Take 1 tablet (500 mg total) by mouth 2 (two) times daily.   14 tablet   0   . ondansetron (ZOFRAN) 4 MG tablet   Oral   Take 1 tablet (4 mg total) by mouth daily as needed for nausea or vomiting.   20 tablet   1     Allergies Review of patient's allergies indicates no known allergies.  No family history on file.  Social History Social History  Substance Use Topics  . Smoking status: Current Every Day Smoker -- 1.00  packs/day    Types: Cigarettes  . Smokeless tobacco: None  . Alcohol Use: No    Review of Systems  Constitutional: Negative for fever. Cardiovascular: As her chest tightness Respiratory:Positive for shortness of breath. Gastrointestinal: Negative for abdominal pain, vomiting and diarrhea. Neurological: Negative for headaches, focal weakness or numbness.   10-point ROS otherwise negative.  ____________________________________________   PHYSICAL EXAM:  VITAL SIGNS: ED Triage Vitals  Enc Vitals Group     BP 01/04/16 0945 101/59 mmHg     Pulse Rate 01/04/16 0945 91     Resp 01/04/16 0945 22     Temp 01/04/16 0945 97.7 F (36.5 C)     Temp Source 01/04/16 0945 Oral     SpO2 01/04/16 0945 98 %     Weight 01/04/16 0945 169 lb (76.658 kg)     Height 01/04/16 0945 5\' 2"  (1.575 m)     Head Cir --      Peak Flow --      Pain Score 01/04/16 0946 4   Constitutional: Alert and oriented. Well appearing and in no distress. Eyes: Conjunctivae are normal. PERRL. Normal extraocular movements. ENT   Head: Normocephalic and atraumatic.   Nose: No congestion/rhinnorhea.   Mouth/Throat: Mucous membranes are moist.   Neck: No stridor. Hematological/Lymphatic/Immunilogical: No cervical lymphadenopathy. Cardiovascular: Normal rate, regular rhythm.  No  murmurs, rubs, or gallops. Respiratory: Normal respiratory effort without tachypnea nor retractions.Diffuse expiratory wheezing Gastrointestinal: Soft and nontender. No distention. There is no CVA tenderness. Genitourinary: Deferred Musculoskeletal: Normal range of motion in all extremities. No joint effusions.  No lower extremity tenderness nor edema. Neurologic:  Normal speech and language. No gross focal neurologic deficits are appreciated.  Skin:  Skin is warm, dry and intact. No rash noted. Psychiatric: Mood and affect are normal. Speech and behavior are normal. Patient exhibits appropriate insight and  judgment.  ____________________________________________    LABS (pertinent positives/negatives)  Labs Reviewed  CBC WITH DIFFERENTIAL/PLATELET - Abnormal; Notable for the following:    RBC 3.25 (*)    Hemoglobin 10.3 (*)    HCT 30.0 (*)    Neutro Abs 6.6 (*)    All other components within normal limits  BASIC METABOLIC PANEL - Abnormal; Notable for the following:    Potassium 3.2 (*)    CO2 21 (*)    Calcium 8.5 (*)    All other components within normal limits     ____________________________________________   EKG  None  ____________________________________________    RADIOLOGY  CXR IMPRESSION: No active cardiopulmonary disease. Stable appearance from priors.  ____________________________________________   PROCEDURES  Procedure(s) performed: None  Critical Care performed: No  ____________________________________________   INITIAL IMPRESSION / ASSESSMENT AND PLAN / ED COURSE  Pertinent labs & imaging results that were available during my care of the patient were reviewed by me and considered in my medical decision making (see chart for details).  Patient presents from labor and delivery because of concern for shortness of breath. Patient with history of asthma. On exam patient has diffuse bilateral wheezing. Will give steroids, duoneb.  ----------------------------------------- 12:05 PM on 01/04/2016 -----------------------------------------  On reexamining this point wheezing has improved. No longer wheezing. Patient also states she feels much better. Chest x-ray and blood work without any concerning findings. Will plan on discharging with albuterol inhaler and steroids.  ____________________________________________   FINAL CLINICAL IMPRESSION(S) / ED DIAGNOSES  Final diagnoses:  Asthma attack     Note: This dictation was prepared with Dragon dictation. Any transcriptional errors that result from this process are unintentional    Shirley Pear, MD 01/04/16 1206

## 2016-01-04 NOTE — ED Notes (Addendum)
Pt arrived via EMS from home with complaints of abd pain, pt is [redacted] weeks pregnant, pt sent to L&D, baby cleared but pt developed sudden SOB, pt has hx of asthma, brought to ED for evaluation, audible wheezing noticed upon pt arrival, labored and fast breathing

## 2016-01-04 NOTE — ED Notes (Addendum)
EHDP at bedside, states yesterday she had an asthma attack, states EMS gave her a breathing tx, arrives this AM with SOB, pt able to speak in full sentences but demonstrates labored breathing, family at bedside

## 2016-01-04 NOTE — Discharge Instructions (Signed)
Please seek medical attention for any high fevers, chest pain, shortness of breath, change in behavior, persistent vomiting, bloody stool or any other new or concerning symptoms.   Asthma, Adult Asthma is a condition of the lungs in which the airways tighten and narrow. Asthma can make it hard to breathe. Asthma cannot be cured, but medicine and lifestyle changes can help control it. Asthma may be started (triggered) by:  Animal skin flakes (dander).  Dust.  Cockroaches.  Pollen.  Mold.  Smoke.  Cleaning products.  Hair sprays or aerosol sprays.  Paint fumes or strong smells.  Cold air, weather changes, and winds.  Crying or laughing hard.  Stress.  Certain medicines or drugs.  Foods, such as dried fruit, potato chips, and sparkling grape juice.  Infections or conditions (colds, flu).  Exercise.  Certain medical conditions or diseases.  Exercise or tiring activities. HOME CARE   Take medicine as told by your doctor.  Use a peak flow meter as told by your doctor. A peak flow meter is a tool that measures how well the lungs are working.  Record and keep track of the peak flow meter's readings.  Understand and use the asthma action plan. An asthma action plan is a written plan for taking care of your asthma and treating your attacks.  To help prevent asthma attacks:  Do not smoke. Stay away from secondhand smoke.  Change your heating and air conditioning filter often.  Limit your use of fireplaces and wood stoves.  Get rid of pests (such as roaches and mice) and their droppings.  Throw away plants if you see mold on them.  Clean your floors. Dust regularly. Use cleaning products that do not smell.  Have someone vacuum when you are not home. Use a vacuum cleaner with a HEPA filter if possible.  Replace carpet with wood, tile, or vinyl flooring. Carpet can trap animal skin flakes and dust.  Use allergy-proof pillows, mattress covers, and box spring  covers.  Wash bed sheets and blankets every week in hot water and dry them in a dryer.  Use blankets that are made of polyester or cotton.  Clean bathrooms and kitchens with bleach. If possible, have someone repaint the walls in these rooms with mold-resistant paint. Keep out of the rooms that are being cleaned and painted.  Wash hands often. GET HELP IF:  You have make a whistling sound when breaking (wheeze), have shortness of breath, or have a cough even if taking medicine to prevent attacks.  The colored mucus you cough up (sputum) is thicker than usual.  The colored mucus you cough up changes from clear or white to yellow, green, gray, or bloody.  You have problems from the medicine you are taking such as:  A rash.  Itching.  Swelling.  Trouble breathing.  You need reliever medicines more than 2-3 times a week.  Your peak flow measurement is still at 50-79% of your personal best after following the action plan for 1 hour.  You have a fever. GET HELP RIGHT AWAY IF:   You seem to be worse and are not responding to medicine during an asthma attack.  You are short of breath even at rest.  You get short of breath when doing very little activity.  You have trouble eating, drinking, or talking.  You have chest pain.  You have a fast heartbeat.  Your lips or fingernails start to turn blue.  You are light-headed, dizzy, or faint.  Your peak  flow is less than 50% of your personal best.   This information is not intended to replace advice given to you by your health care provider. Make sure you discuss any questions you have with your health care provider.   Document Released: 12/26/2007 Document Revised: 03/30/2015 Document Reviewed: 02/05/2013 Elsevier Interactive Patient Education [employer].

## 2016-01-30 ENCOUNTER — Inpatient Hospital Stay: Payer: Medicaid Other | Admitting: Anesthesiology

## 2016-01-30 ENCOUNTER — Inpatient Hospital Stay
Admission: EM | Admit: 2016-01-30 | Discharge: 2016-02-01 | DRG: 775 | Disposition: A | Discharge status: Home / Self Care | Payer: Medicaid Other | Attending: Obstetrics and Gynecology | Admitting: Obstetrics and Gynecology

## 2016-01-30 DIAGNOSIS — O365930 Maternal care for other known or suspected poor fetal growth, third trimester, not applicable or unspecified: Secondary | ICD-10-CM | POA: Diagnosis present

## 2016-01-30 DIAGNOSIS — Z3A38 38 weeks gestation of pregnancy: Secondary | ICD-10-CM | POA: Diagnosis not present

## 2016-01-30 DIAGNOSIS — D62 Acute posthemorrhagic anemia: Secondary | ICD-10-CM | POA: Diagnosis present

## 2016-01-30 DIAGNOSIS — Z8659 Personal history of other mental and behavioral disorders: Secondary | ICD-10-CM

## 2016-01-30 DIAGNOSIS — F1290 Cannabis use, unspecified, uncomplicated: Secondary | ICD-10-CM | POA: Diagnosis present

## 2016-01-30 DIAGNOSIS — O99324 Drug use complicating childbirth: Principal | ICD-10-CM | POA: Diagnosis present

## 2016-01-30 DIAGNOSIS — F17210 Nicotine dependence, cigarettes, uncomplicated: Secondary | ICD-10-CM | POA: Diagnosis present

## 2016-01-30 DIAGNOSIS — Z9889 Other specified postprocedural states: Secondary | ICD-10-CM

## 2016-01-30 DIAGNOSIS — O9081 Anemia of the puerperium: Secondary | ICD-10-CM | POA: Diagnosis present

## 2016-01-30 DIAGNOSIS — O99334 Smoking (tobacco) complicating childbirth: Secondary | ICD-10-CM | POA: Diagnosis present

## 2016-01-30 LAB — CBC
HCT: 32.3 % — ABNORMAL LOW (ref 35.0–47.0)
Hemoglobin: 11.4 g/dL — ABNORMAL LOW (ref 12.0–16.0)
MCH: 32.2 pg (ref 26.0–34.0)
MCHC: 35.3 g/dL (ref 32.0–36.0)
MCV: 91.2 fL (ref 80.0–100.0)
PLATELETS: 309 10*3/uL (ref 150–440)
RBC: 3.54 MIL/uL — AB (ref 3.80–5.20)
RDW: 14 % (ref 11.5–14.5)
WBC: 14.3 10*3/uL — AB (ref 3.6–11.0)

## 2016-01-30 LAB — TYPE AND SCREEN
ABO/RH(D): B POS
Antibody Screen: NEGATIVE

## 2016-01-30 LAB — URINE DRUG SCREEN, QUALITATIVE (ARMC ONLY)
AMPHETAMINES, UR SCREEN: NOT DETECTED
Barbiturates, Ur Screen: NOT DETECTED
Benzodiazepine, Ur Scrn: NOT DETECTED
Cannabinoid 50 Ng, Ur ~~LOC~~: POSITIVE — AB
Cocaine Metabolite,Ur ~~LOC~~: NOT DETECTED
MDMA (ECSTASY) UR SCREEN: NOT DETECTED
Methadone Scn, Ur: NOT DETECTED
Opiate, Ur Screen: NOT DETECTED
Phencyclidine (PCP) Ur S: NOT DETECTED
TRICYCLIC, UR SCREEN: NOT DETECTED

## 2016-01-30 LAB — CHLAMYDIA/NGC RT PCR (ARMC ONLY)
CHLAMYDIA TR: NOT DETECTED
N gonorrhoeae: NOT DETECTED

## 2016-01-30 MED ORDER — COCONUT OIL OIL
1.0000 "application " | TOPICAL_OIL | Status: DC | PRN
  Filled 2016-01-30: qty 120

## 2016-01-30 MED ORDER — TETANUS-DIPHTH-ACELL PERTUSSIS 5-2.5-18.5 LF-MCG/0.5 IM SUSP: 0.5000 mL | Freq: Once | INTRAMUSCULAR | Status: DC

## 2016-01-30 MED ORDER — BUTORPHANOL TARTRATE 1 MG/ML IJ SOLN
1.0000 mg | INTRAMUSCULAR | Status: DC | PRN
  Administered 2016-01-30: 1 mg via INTRAVENOUS
  Filled 2016-01-30: qty 1

## 2016-01-30 MED ORDER — OXYTOCIN 40 UNITS IN LACTATED RINGERS INFUSION - SIMPLE MED
2.5000 [IU]/h | INTRAVENOUS | Status: DC
Start: 2016-01-30 — End: 2016-01-30
  Filled 2016-01-30: qty 1000

## 2016-01-30 MED ORDER — PHENYLEPHRINE 40 MCG/ML (10ML) SYRINGE FOR IV PUSH (FOR BLOOD PRESSURE SUPPORT): 80.0000 ug | PREFILLED_SYRINGE | INTRAVENOUS | Status: DC | PRN

## 2016-01-30 MED ORDER — FENTANYL 2.5 MCG/ML W/ROPIVACAINE 0.2% IN NS 100 ML EPIDURAL INFUSION (ARMC-ANES)
EPIDURAL | Status: DC | PRN
  Administered 2016-01-30: 10 mL/h via EPIDURAL

## 2016-01-30 MED ORDER — PHENYLEPHRINE 40 MCG/ML (10ML) SYRINGE FOR IV PUSH (FOR BLOOD PRESSURE SUPPORT)
80.0000 ug | PREFILLED_SYRINGE | INTRAVENOUS | Status: DC | PRN
Start: 2016-01-30 — End: 2016-01-30

## 2016-01-30 MED ORDER — OXYCODONE HCL 5 MG PO TABS: 10.0000 mg | ORAL_TABLET | ORAL | Status: DC | PRN

## 2016-01-30 MED ORDER — DIPHENHYDRAMINE HCL 50 MG/ML IJ SOLN: 12.5000 mg | INTRAMUSCULAR | Status: DC | PRN

## 2016-01-30 MED ORDER — SENNOSIDES-DOCUSATE SODIUM 8.6-50 MG PO TABS
2.0000 | ORAL_TABLET | ORAL | Status: DC
  Administered 2016-01-31: 2 via ORAL
  Filled 2016-01-30: qty 2

## 2016-01-30 MED ORDER — NICOTINE 7 MG/24HR TD PT24
7.0000 mg | MEDICATED_PATCH | Freq: Every day | TRANSDERMAL | Status: DC
  Administered 2016-01-30 – 2016-01-31 (×2): 7 mg via TRANSDERMAL
  Filled 2016-01-30 (×3): qty 1

## 2016-01-30 MED ORDER — AMMONIA AROMATIC IN INHA
RESPIRATORY_TRACT | Status: AC
  Filled 2016-01-30: qty 10

## 2016-01-30 MED ORDER — LACTATED RINGERS IV SOLN
INTRAVENOUS | Status: DC
  Administered 2016-01-30: 10:00:00 via INTRAVENOUS

## 2016-01-30 MED ORDER — DIBUCAINE 1 % RE OINT: 1.0000 "application " | TOPICAL_OINTMENT | RECTAL | Status: DC | PRN

## 2016-01-30 MED ORDER — ALBUTEROL SULFATE (2.5 MG/3ML) 0.083% IN NEBU
2.5000 mg | INHALATION_SOLUTION | Freq: Four times a day (QID) | RESPIRATORY_TRACT | Status: DC | PRN
  Administered 2016-01-30: 2.5 mg via RESPIRATORY_TRACT

## 2016-01-30 MED ORDER — ACETAMINOPHEN 325 MG PO TABS
650.0000 mg | ORAL_TABLET | ORAL | Status: DC | PRN
  Administered 2016-01-30 – 2016-02-01 (×5): 650 mg via ORAL
  Filled 2016-01-30 (×6): qty 2

## 2016-01-30 MED ORDER — ONDANSETRON HCL 4 MG PO TABS: 4.0000 mg | ORAL_TABLET | ORAL | Status: DC | PRN

## 2016-01-30 MED ORDER — LIDOCAINE HCL (PF) 1 % IJ SOLN
30.0000 mL | INTRAMUSCULAR | Status: DC | PRN
Start: 2016-01-30 — End: 2016-01-30

## 2016-01-30 MED ORDER — PRENATAL MULTIVITAMIN CH
1.0000 | ORAL_TABLET | Freq: Every day | ORAL | Status: DC
  Administered 2016-01-31 – 2016-02-01 (×2): 1 via ORAL
  Filled 2016-01-30 (×3): qty 1

## 2016-01-30 MED ORDER — LACTATED RINGERS IV SOLN
500.0000 mL | INTRAVENOUS | Status: DC | PRN
  Administered 2016-01-30: 500 mL via INTRAVENOUS

## 2016-01-30 MED ORDER — FENTANYL 2.5 MCG/ML W/ROPIVACAINE 0.2% IN NS 100 ML EPIDURAL INFUSION (ARMC-ANES)
EPIDURAL | Status: AC
  Filled 2016-01-30: qty 100

## 2016-01-30 MED ORDER — ONDANSETRON HCL 4 MG/2ML IJ SOLN: 4.0000 mg | INTRAMUSCULAR | Status: DC | PRN

## 2016-01-30 MED ORDER — BENZOCAINE-MENTHOL 20-0.5 % EX AERO: 1.0000 "application " | INHALATION_SPRAY | CUTANEOUS | Status: DC | PRN

## 2016-01-30 MED ORDER — ACETAMINOPHEN 325 MG PO TABS: 650.0000 mg | ORAL_TABLET | ORAL | Status: DC | PRN

## 2016-01-30 MED ORDER — WITCH HAZEL-GLYCERIN EX PADS: 1.0000 "application " | MEDICATED_PAD | CUTANEOUS | Status: DC | PRN

## 2016-01-30 MED ORDER — OXYTOCIN BOLUS FROM INFUSION
500.0000 mL | INTRAVENOUS | Status: DC
  Administered 2016-01-30: 500 mL via INTRAVENOUS

## 2016-01-30 MED ORDER — FENTANYL 2.5 MCG/ML W/ROPIVACAINE 0.2% IN NS 100 ML EPIDURAL INFUSION (ARMC-ANES): 9.0000 mL/h | EPIDURAL | Status: DC

## 2016-01-30 MED ORDER — DIPHENHYDRAMINE HCL 25 MG PO CAPS: 25.0000 mg | ORAL_CAPSULE | Freq: Four times a day (QID) | ORAL | Status: DC | PRN

## 2016-01-30 MED ORDER — MISOPROSTOL 200 MCG PO TABS
ORAL_TABLET | ORAL | Status: AC
  Filled 2016-01-30: qty 4

## 2016-01-30 MED ORDER — OXYCODONE HCL 5 MG PO TABS: 5.0000 mg | ORAL_TABLET | ORAL | Status: DC | PRN

## 2016-01-30 MED ORDER — BUPIVACAINE HCL (PF) 0.25 % IJ SOLN
INTRAMUSCULAR | Status: DC | PRN
  Administered 2016-01-30 (×2): 4 mL via EPIDURAL

## 2016-01-30 MED ORDER — ONDANSETRON HCL 4 MG/2ML IJ SOLN
4.0000 mg | Freq: Four times a day (QID) | INTRAMUSCULAR | Status: DC | PRN
  Administered 2016-01-30: 4 mg via INTRAVENOUS
  Filled 2016-01-30: qty 2

## 2016-01-30 MED ORDER — SIMETHICONE 80 MG PO CHEW: 80.0000 mg | CHEWABLE_TABLET | ORAL | Status: DC | PRN

## 2016-01-30 MED ORDER — IBUPROFEN 600 MG PO TABS
600.0000 mg | ORAL_TABLET | Freq: Four times a day (QID) | ORAL | Status: DC
  Administered 2016-01-30 – 2016-01-31 (×2): 600 mg via ORAL
  Filled 2016-01-30 (×3): qty 1

## 2016-01-30 MED ORDER — LIDOCAINE-EPINEPHRINE (PF) 1.5 %-1:200000 IJ SOLN
INTRAMUSCULAR | Status: DC | PRN
  Administered 2016-01-30: 3 mL via EPIDURAL

## 2016-01-30 MED ORDER — ALBUTEROL SULFATE (2.5 MG/3ML) 0.083% IN NEBU
INHALATION_SOLUTION | RESPIRATORY_TRACT | Status: AC
  Filled 2016-01-30: qty 3

## 2016-01-30 MED ORDER — LACTATED RINGERS IV SOLN: 500.0000 mL | Freq: Once | INTRAVENOUS | Status: DC

## 2016-01-30 MED ORDER — BUDESONIDE 0.25 MG/2ML IN SUSP
0.2500 mg | Freq: Two times a day (BID) | RESPIRATORY_TRACT | Status: DC
  Filled 2016-01-30: qty 2

## 2016-01-30 MED ORDER — EPHEDRINE 5 MG/ML INJ: 10.0000 mg | INTRAVENOUS | Status: DC | PRN

## 2016-01-30 MED ORDER — OXYTOCIN 10 UNIT/ML IJ SOLN
INTRAMUSCULAR | Status: AC
  Filled 2016-01-30: qty 2

## 2016-01-30 MED ORDER — LIDOCAINE HCL (PF) 1 % IJ SOLN
INTRAMUSCULAR | Status: AC
  Filled 2016-01-30: qty 30

## 2016-01-30 MED ORDER — LIDOCAINE HCL (PF) 1 % IJ SOLN
INTRAMUSCULAR | Status: DC | PRN
  Administered 2016-01-30: 3 mL via SUBCUTANEOUS

## 2016-01-30 MED ORDER — EPHEDRINE 5 MG/ML INJ
10.0000 mg | INTRAVENOUS | Status: DC | PRN
Start: 2016-01-30 — End: 2016-01-30

## 2016-01-30 NOTE — Anesthesia Procedure Notes (Signed)
Epidural Patient location during procedure: OB Start time: 01/30/2016 10:42 AM End time: 01/30/2016 10:49 AM  Staffing Anesthesiologist: Marline Backbone F Resident/CRNA: Johnna Acosta Performed by: resident/CRNA   Preanesthetic Checklist Completed: patient identified, site marked, surgical consent, pre-op evaluation, IV checked, risks and benefits discussed and monitors and equipment checked  Epidural Patient position: sitting Prep: Betadine Patient monitoring: continuous pulse ox, blood pressure and heart rate Approach: midline Location: L3-L4 Injection technique: LOR saline  Needle:  Needle type: Tuohy  Needle gauge: 17 G Needle length: 9 cm Needle insertion depth: 6 cm Catheter type: closed end flexible Catheter size: 19 Gauge Catheter at skin depth: 12 cm Test dose: negative and 1.5% lidocaine with Epi 1:200 K  Assessment Events: blood not aspirated, injection not painful, no injection resistance, negative IV test and no paresthesia  Additional Notes Reason for block:procedure for pain

## 2016-01-30 NOTE — Anesthesia Preprocedure Evaluation (Signed)
Anesthesia Evaluation  Patient identified by MRN, date of birth, ID band Patient awake    Reviewed: Allergy & Precautions, H&P , NPO status , Patient's Chart, lab work & pertinent test results  History of Anesthesia Complications Negative for: history of anesthetic complications  Airway Mallampati: II       Dental no notable dental hx.    Pulmonary asthma , Current Smoker,           Cardiovascular      Neuro/Psych    GI/Hepatic   Endo/Other    Renal/GU      Musculoskeletal   Abdominal   Peds  Hematology   Anesthesia Other Findings   Reproductive/Obstetrics (+) Pregnancy                             Anesthesia Physical Anesthesia Plan  ASA: II  Anesthesia Plan: Epidural   Post-op Pain Management:    Induction:   Airway Management Planned:   Additional Equipment:   Intra-op Plan:   Post-operative Plan:   Informed Consent: I have reviewed the patients History and Physical, chart, labs and discussed the procedure including the risks, benefits and alternatives for the proposed anesthesia with the patient or authorized representative who has indicated his/her understanding and acceptance.     Plan Discussed with: Anesthesiologist  Anesthesia Plan Comments:         Anesthesia Quick Evaluation

## 2016-01-30 NOTE — Progress Notes (Signed)
  Labor Progress Note   25 y.o. G2P1 @ [redacted]w[redacted]d , admitted for  Pregnancy, Labor Management.   Subjective:  Pt is comfortable with epidural and is feeling some pressure with contractions.   Objective:  BP 124/84 mmHg  Pulse 91  Resp 19  Ht 5\' 3"  (1.6 m)  Wt 77.111 kg (170 lb)  BMI 30.12 kg/m2  LMP 05/07/2015 (Approximate) Abd: mild  Extr: trace to 1+ bilateral pedal edema SVE: CERVIX: 9 cm dilated, 100 effaced, 0 station AROM scant clear fluid  EFM: FHR: 115 bpm, variability: minimal ,  accelerations:  Present,  decelerations:  Absent Toco: Frequency: Every 2-3 minutes Labs:  Results for Shirley, Turner (MRN [id number]) as of 01/30/2016 14:14  Ref. Range 01/30/2016 09:53 01/30/2016 09:55  WBC Latest Ref Range: 3.6-11.0 K/uL 14.3 (H)   RBC Latest Ref Range: 3.80-5.20 MIL/uL 3.54 (L)   Hemoglobin Latest Ref Range: 12.0-16.0 g/dL 11.4 (L)   HCT Latest Ref Range: 35.0-47.0 % 32.3 (L)   MCV Latest Ref Range: 80.0-100.0 fL 91.2   MCH Latest Ref Range: 26.0-34.0 pg 32.2   MCHC Latest Ref Range: 32.0-36.0 g/dL 35.3   RDW Latest Ref Range: 11.5-14.5 % 14.0   Platelets Latest Ref Range: 150-440 K/uL 309   Sample Expiration Unknown 02/02/2016   Antibody Screen Unknown NEG   ABO/RH(D) Unknown B POS   Specimen source GC/Chlam Unknown  ENDOCERVICAL  Chlamydia Tr Latest Ref Range: NOT DETECTED   NOT DETECTED  N gonorrhoeae Latest Ref Range: NOT DETECTED   NOT DETECTED  Amphetamines, Ur Screen Latest Ref Range: NONE DETECTED   NONE DETECTED  Barbiturates, Ur Screen Latest Ref Range: NONE DETECTED   NONE DETECTED  Benzodiazepine, Ur Scrn Latest Ref Range: NONE DETECTED   NONE DETECTED  Cocaine Metabolite,Ur [place] Latest Ref Range: NONE DETECTED   NONE DETECTED  Methadone Scn, Ur Latest Ref Range: NONE DETECTED   NONE DETECTED  MDMA (Ecstasy)Ur Screen Latest Ref Range: NONE DETECTED   NONE DETECTED  Cannabinoid 50 Ng, Ur [place] Latest Ref Range: NONE DETECTED   POSITIVE (A)  Opiate, Ur Screen Latest  Ref Range: NONE DETECTED   NONE DETECTED  Phencyclidine (PCP) Ur S Latest Ref Range: NONE DETECTED   NONE DETECTED  Tricyclic, Ur Screen Latest Ref Range: NONE DETECTED   NONE DETECTED      Assessment & Plan:  G2P1 @ [redacted]w[redacted]d, admitted for  Pregnancy and Labor/Delivery Management  1. Pain management: epidural. 2. FWB: FHT category II.  3. ID: GBS unknown: not treated 4. Labor management: expectant All discussed with patient, see orders   Shirley Turner, CNM

## 2016-01-30 NOTE — Discharge Summary (Signed)
Obstetric Discharge Summary  Reason for Admission: onset of labor Prenatal Procedures: ultrasound, late entry to and limited prenatal care, Marijuana use during pregnancy, abnormal PAP/HPV pos Intrapartum Procedures: spontaneous vaginal delivery, see details of delivery in a separate note Postpartum Procedures: Varivax Complications-Operative and Postpartum: mild anemia  HEMOGLOBIN  Date Value Ref Range Status  01/31/2016 9.8* 12.0 - 16.0 g/dL Final   HGB  Date Value Ref Range Status  03/02/2014 13.5 12.0-16.0 g/dL Final   HCT  Date Value Ref Range Status  01/31/2016 28.5* 35.0 - 47.0 % Final  03/02/2014 40.8 35.0-47.0 % Final    Physical Exam:  General: alert, cooperative, appears stated age and no distress Lochia: appropriate Uterine Fundus: firm at U-1/ML/NT Incision: NA DVT Evaluation: No evidence of DVT seen on physical exam.  Discharge Diagnoses: Term Pregnancy-delivered/ small for gestational age baby  Discharge Information: Date: 02/01/2016 Activity: pelvic rest x 6 weeks, no heavy lifting x 4 weeks Diet: routine Medications: Niferex, Wellbutrin 150 mgm SR daily in AM, Tylenol 1000 mgm q6 hr prn for pain Condition: stable Instructions: refer to practice specific booklet Discharge to: home Follow-up Information    Schedule an appointment as soon as possible for a visit with [place], [place].   Specialty:  Obstetrics   Why:  2 weeks for evaluation for PPD and to discuss birth control   Contact information:   [place] [address] [phone]       Newborn Data:  Live born female: Kimberlynn Birth Weight: 2200 grams  APGAR: 8, 9  Baby remains in NICU, receiving NG tube feeding   Selyna Klahn, CNM

## 2016-01-30 NOTE — H&P (Signed)
OB History & Physical   History of Present Illness:  Chief Complaint: Contractions  HPI:  Shirley Turner is a 25 y.o. G2P1 female at [redacted]w[redacted]d dated by LMP.  Her pregnancy has been complicated by late entry and limited prenatal care, unknown gbs, marijuana use throughout pregnancy, abnormal PAP smear, anemia.    She reports contractions.   She denies leakage of fluid.   She denies vaginal bleeding.   She reports fetal movement.    Maternal Medical History:   Past Medical History  Diagnosis Date  . Anxiety   . Asthma     Past Surgical History  Procedure Laterality Date  . Tonsillectomy and adenoidectomy      No Known Allergies  Prior to Admission medications   Medication Sig Start Date End Date Taking? Authorizing Provider  albuterol (PROVENTIL HFA;VENTOLIN HFA) 108 (90 BASE) MCG/ACT inhaler Inhale 2 puffs into the lungs every 6 (six) hours as needed for wheezing or shortness of breath. Reported on 01/30/2016    Historical Provider, MD  albuterol (PROVENTIL HFA;VENTOLIN HFA) 108 (90 Base) MCG/ACT inhaler Inhale 2 puffs into the lungs every 6 (six) hours as needed for wheezing or shortness of breath. Patient not taking: Reported on 01/30/2016 01/04/16   [name], MD  metroNIDAZOLE (FLAGYL) 500 MG tablet Take 1 tablet (500 mg total) by mouth 2 (two) times daily. Patient not taking: Reported on 01/30/2016 07/30/15   Eula Listen, MD  ondansetron (ZOFRAN) 4 MG tablet Take 1 tablet (4 mg total) by mouth daily as needed for nausea or vomiting. Patient not taking: Reported on 01/30/2016 07/13/15   Earleen Newport, MD  predniSONE (DELTASONE) 20 MG tablet Take 2 tablets (40 mg total) by mouth daily. Patient not taking: Reported on 01/30/2016 01/04/16   [name], MD    OB History  Gravida Para Term Preterm AB SAB TAB Ectopic Multiple Living  2 1        1     # Outcome Date GA Lbr Len/2nd Weight Sex Delivery Anes PTL Lv  2 Current           1 Para 10/03/14    F Vag-Spont   N Y      Prenatal care site: [facility]  Social History: She  reports that she has been smoking Cigarettes.  She has been smoking about 1.00 pack per day. She does not have any smokeless tobacco history on file. She reports that she uses illicit drugs (Marijuana). She reports that she does not drink alcohol.  Family History: family history is not on file.   Review of Systems: Negative x 10 systems reviewed except as noted in the HPI.    Physical Exam:  Vital Signs: LMP 05/07/2015 (Approximate) General: no acute distress.  HEENT: normocephalic, atraumatic Heart: regular rate & rhythm.  No murmurs/rubs/gallops Lungs: clear to auscultation bilaterally Abdomen: soft, gravid, non-tender;  EFW: 6 pounds Pelvic: (female chaperone present during pelvic exam)  External: Normal external female genitalia  Cervix:  4 /  90 /  0   Extremities: non-tender, symmetric, trace edema bilaterally.  DTRs: 2+ bilaterally  Neurologic: Alert & oriented x 3.    Pertinent Results:  Prenatal Labs: Blood type/Rh B positive  Antibody screen negative  Rubella Immune  Varicella Not immune    RPR Non reactive  HBsAg negative  HIV negative  GC negative  Chlamydia negative  Genetic screening MSAFP negative  1 hour GTT 123 on 5/23  3 hour GTT NA  GBS  Unknown due to limited PNC   Baseline FHR: 120 beats/min   Variability: moderate   Accelerations: present   Decelerations: absent Contractions: present frequency: q 2-3 minutes Overall assessment: Category I   Assessment:  Shirley Turner is a 25 y.o. G2P1 female at [redacted]w[redacted]d with contractions at term.   Plan:  1. Admit to Labor & Delivery  2. CBC, T&S, Clrs, IVF, UDS, GBS swab for unknown 3. Fetal Well Being: Category I 4.   Epidural as desired   Faron Tudisco, CNM

## 2016-01-30 NOTE — Progress Notes (Signed)
Pt transferred to m/b unit in stable condition. C/o anxiety before transfer d/t concern for baby's condition. Visited baby in nursery. Pt was educated on baby's condition and visiting policies. Showed decreased anxiety after speaking with nursery RN.

## 2016-01-31 LAB — CBC
HEMATOCRIT: 28.5 % — AB (ref 35.0–47.0)
HEMOGLOBIN: 9.8 g/dL — AB (ref 12.0–16.0)
MCH: 31.9 pg (ref 26.0–34.0)
MCHC: 34.5 g/dL (ref 32.0–36.0)
MCV: 92.6 fL (ref 80.0–100.0)
Platelets: 263 10*3/uL (ref 150–440)
RBC: 3.08 MIL/uL — AB (ref 3.80–5.20)
RDW: 14 % (ref 11.5–14.5)
WBC: 15.9 10*3/uL — AB (ref 3.6–11.0)

## 2016-01-31 LAB — RPR: RPR: NONREACTIVE

## 2016-01-31 MED ORDER — IBUPROFEN 600 MG PO TABS
600.0000 mg | ORAL_TABLET | Freq: Four times a day (QID) | ORAL | Status: DC
  Administered 2016-01-31 – 2016-02-01 (×4): 600 mg via ORAL
  Filled 2016-01-31 (×5): qty 1

## 2016-01-31 NOTE — Progress Notes (Signed)
Pt returned to room.  Pt stated that she had been outside, sitting on a bench and in their car, for the last 2 hours on the phone with her Mom and Dad and that she did not leave the campus.  Pt educated about not leaving the campus and if she was to go outside again to notify staff.

## 2016-01-31 NOTE — Progress Notes (Signed)
Went into pt room at 1200 to administer scheduled medications.  Pt was not there and family stated that she was in the nursery visiting with the infant.  At 1245 I asked family members in the hallway outside of pt room if pt was still in the nursery and they stated yes.  I returned to the room @ 1315 to try to administer the medications again and found no one in the room.  At 1400 I checked the room and the nursery, finding no pt.  The nurses in the SCN stated that the pt had not been in to see the infant all day.

## 2016-01-31 NOTE — Anesthesia Postprocedure Evaluation (Signed)
Anesthesia Post Note  Patient: Shirley Turner  Procedure(s) Performed: * No procedures listed *  Patient location during evaluation: Mother Baby Anesthesia Type: Epidural Level of consciousness: awake, oriented and awake and alert Pain management: pain level controlled Vital Signs Assessment: post-procedure vital signs reviewed and stable Respiratory status: spontaneous breathing, nonlabored ventilation and respiratory function stable Cardiovascular status: stable Postop Assessment: no backache Anesthetic complications: no Comments: Site is clean, dry, intact.    Last Vitals:  Filed Vitals:   01/31/16 0031 01/31/16 0350  BP: 128/80 118/76  Pulse: 66 67  Temp: 36.8 C 36.7 C  Resp: 19 18    Last Pain:  Filed Vitals:   01/31/16 0403  PainSc: 0-No pain                 Lance Muss

## 2016-01-31 NOTE — Progress Notes (Signed)
  Postpartum Day 1  Subjective: no complaints and up ad lib - pt quite sleepy during exam, but denies complaints  Objective: Blood pressure 114/72, pulse 80, temperature 98.2 F (36.8 C), temperature source Oral, resp. rate 18, height 5\' 3"  (1.6 m), weight 170 lb (77.111 kg), last menstrual period 05/07/2015, SpO2 99 %.  Physical Exam:  General: cooperative Lochia: appropriate Uterine Fundus: firm Incision: N/A DVT Evaluation: No evidence of DVT seen on physical exam. Abdomen: soft, NT   Recent Labs  01/30/16 0953 01/31/16 0606  HGB 11.4* 9.8*  HCT 32.3* 28.5*    Assessment PPD #1, acute blood loss anemia  Plan: Discharge tomorrow, Continue PP care and Fe replacement, anemia precautions  Feeding: bottle Blood Type: B+ RI/VNI TDAP needs    Burlene Arnt, [place] 01/31/2016, 9:21 AM

## 2016-02-01 DIAGNOSIS — Z8659 Personal history of other mental and behavioral disorders: Secondary | ICD-10-CM

## 2016-02-01 MED ORDER — VARICELLA VIRUS VACCINE LIVE 1350 PFU/0.5ML IJ SUSR
0.5000 mL | Freq: Once | INTRAMUSCULAR | Status: AC
  Administered 2016-02-01: 0.5 mL via SUBCUTANEOUS
  Filled 2016-02-01 (×2): qty 0.5

## 2016-02-01 MED ORDER — ACETAMINOPHEN 500 MG PO TABS: 1000.0000 mg | ORAL_TABLET | Freq: Four times a day (QID) | ORAL | Status: DC | PRN

## 2016-02-01 MED ORDER — BUPROPION HCL ER (SR) 150 MG PO TB12: 150.0000 mg | ORAL_TABLET | Freq: Every day | ORAL | Status: DC

## 2016-02-01 MED ORDER — POLYSACCHARIDE IRON COMPLEX 150 MG PO CAPS: 150.0000 mg | ORAL_CAPSULE | Freq: Every day | ORAL | Status: DC

## 2016-02-01 NOTE — Clinical Social Work Note (Signed)
The following is the CSW documentation placed on patient's infant chart: [place] MATERNAL/CHILD NOTE  Patient Details  Name: Shirley Turner MRN: [id number] Date of Birth: 01/30/2017  Date: 02/01/2016  Clinical Social Worker Initiating Note: Shela Leff MSW,LCSWDate/ Time Initiated: 02/01/16/    Child's Name:     Legal Guardian: Mother   Need for Interpreter: None   Date of Referral: 02/01/16   Reason for Referral: Current Substance Use/Substance Use During Pregnancy , Late or No Prenatal Care    Referral Source: NICU   Address:    Phone number:     Household Members: Self, Siblings, Parents, Minor Children   Natural Supports (not living in the home): Spouse/significant other   Professional Supports:None   Employment:Unemployed   Type of Work:     Education:     Museum/gallery curator Resources:Medicaid   Other Resources: [employer]   Cultural/Religious Considerations Which May Impact Care: none  Strengths:     Risk Factors/Current Problems: Basic Needs    Cognitive State: Alert    Mood/Affect: Calm    CSW Assessment:CSW consulted for late prenatal care. Upon review of the medical record, CSW became aware that patient has tested positive for marijuana and patient's mother also tested positive. CSW spoke with patient's mother this morning and she stated that she did not prepare for supplies for her newborn and does not have anything. Patient states she will still have a shower. Patient's mother states that in the home will be her, her one year old, her mother and her mother's other children. Patient's mother reports that she has a history of depression and was on medication but has not been on medication for over 6 years and has been doing well. Patient's mother reports that she used marijuana daily for appetite. She reports that she knows her newborn tested positive as well and that she knew a DSS CPS report would be made.  She stated that she smoked marijuana with her pregnancy for her 26 year old and that [place] came and did nothing. She states that she is unemployed and that any money she made with babysitting during this pregnancy, she would sometimes spend on marijuana. Patient's mother reports she will have transportation issues because her mother has two jobs and requires the use of the car, but she stated she would find a way to come visit patient.   CSW Plan/Description: Child Protective Service Report

## 2016-02-01 NOTE — Progress Notes (Signed)
Patient discharged home with family. Infant remains in SCN. Discharge instructions, prescriptions and follow up appointment given to and reviewed with patient. Patient verbalized understanding. Escorted out via wheelchair by [employer].

## 2016-02-01 NOTE — Discharge Instructions (Signed)
Vaginal Delivery, Care After Refer to this sheet in the next few weeks. These discharge instructions provide you with information on caring for yourself after delivery. Your caregiver may also give you specific instructions. Your treatment has been planned according to the most current medical practices available, but problems sometimes occur. Call your caregiver if you have any problems or questions after you go home. HOME CARE INSTRUCTIONS 1. Take over-the-counter or prescription medicines only as directed by your caregiver or pharmacist. 2. Do not drink alcohol, especially if you are breastfeeding or taking medicine to relieve pain. 3. Do not smoke tobacco. 4. Continue to use good perineal care. Good perineal care includes: 1. Wiping your perineum from back to front 2. Keeping your perineum clean. 3. You can do sitz baths twice a day, to help keep this area clean 5. Do not use tampons, douche or have sex for 6 weeks 6. Shower only and avoid sitting in submerged water, aside from sitz baths 7. Wear a well-fitting bra that provides breast support. 8. Eat healthy foods. 9. Drink enough fluids to keep your urine clear or pale yellow. 10. Eat high-fiber foods such as whole grain cereals and breads, brown rice, beans, and fresh fruits and vegetables every day. These foods may help prevent or relieve constipation. 11. Avoid constipation with high fiber foods or medications, such as miralax or metamucil 12. Follow your caregiver's recommendations regarding resumption of activities such as climbing stairs, driving, lifting, exercising, or traveling. 13. No tampons, douching, or intercourse for 6 weeks. 14. Try to have someone help you with your household activities and your newborn for at least a few days after you leave the hospital. 15. Rest as much as possible. Try to rest or take a nap when your newborn is sleeping. 16. Increase your activities gradually. 57. Keep all of your scheduled  postpartum appointments. It is very important to keep your scheduled follow-up appointments. At these appointments, your caregiver will be checking to make sure that you are healing physically and emotionally. SEEK MEDICAL CARE IF:   You are passing large clots from your vagina. Save any clots to show your caregiver.  You have a foul smelling discharge from your vagina.  You have trouble urinating.  You are urinating frequently.  You have pain when you urinate.  You have a change in your bowel movements.  You have increasing redness, pain, or swelling near your vaginal incision (episiotomy) or vaginal tear.  You have pus draining from your episiotomy or vaginal tear.  Your episiotomy or vaginal tear is separating.  You have painful, hard, or reddened breasts.  You have a severe headache.  You have blurred vision or see spots.  You feel sad or depressed.  You have thoughts of hurting yourself or your newborn.  You have questions about your care, the care of your newborn, or medicines.  You are dizzy or light-headed.  You have a rash.  You have nausea or vomiting.  You were breastfeeding and have not had a menstrual period within 12 weeks after you stopped breastfeeding.  You are not breastfeeding and have not had a menstrual period by the 12th week after delivery.  You have a fever. SEEK IMMEDIATE MEDICAL CARE IF:   You have persistent pain.  You have chest pain.  You have shortness of breath.  You faint.  You have leg pain.  You have stomach pain.  Your vaginal bleeding saturates two or more sanitary pads in 1 hour. MAKE SURE  YOU:   Understand these instructions.  Will watch your condition.  Will get help right away if you are not doing well or get worse. Document Released: 07/06/2000 Document Revised: 11/23/2013 Document Reviewed: 03/05/2012 [facility] Patient Information 2015 [place], [place]. This information is not intended to replace advice given  to you by your health care provider. Make sure you discuss any questions you have with your health care provider.  Sitz Bath A sitz bath is a warm water bath taken in the sitting position. The water covers only the hips and butt (buttocks). We recommend using one that fits in the toilet, to help with ease of use and cleanliness. It may be used for either healing or cleaning purposes. Sitz baths are also used to relieve pain, itching, or muscle tightening (spasms). The water may contain medicine. Moist heat will help you heal and relax.  HOME CARE  Take 3 to 4 sitz baths a day. 18. Fill the bathtub half-full with warm water. 19. Sit in the water and open the drain a little. 20. Turn on the warm water to keep the tub half-full. Keep the water running constantly. 21. Soak in the water for 15 to 20 minutes. 22. After the sitz bath, pat the affected area dry. GET HELP RIGHT AWAY IF: You get worse instead of better. Stop the sitz baths if you get worse. MAKE SURE YOU:  Understand these instructions.  Will watch your condition.  Will get help right away if you are not doing well or get worse. Document Released: 08/16/2004 Document Revised: 04/02/2012 Document Reviewed: 11/06/2010 [facility] Patient Information 2015 [place], [place]. This information is not intended to replace advice given to you by your health care provider. Make sure you discuss any questions you have with your health care provider.

## 2016-02-02 LAB — CULTURE, BETA STREP (GROUP B ONLY)

## 2016-04-02 IMAGING — US US OB TRANSVAGINAL
1 series · 14 of 28 positions shown · non-contrast
Comparison: None.

CLINICAL DATA: Left lower abdominal pain for 2 days. Pregnant
patient. Patient is 12 weeks pregnant based on her last menstrual
period. Beta HCG level, [DATE].

EXAM:
OBSTETRIC <14 WK US AND TRANSVAGINAL OB US
TECHNIQUE: Both transabdominal and transvaginal ultrasound examinations were
performed for complete evaluation of the gestation as well as the
maternal uterus, adnexal regions, and pelvic cul-de-sac.
Transvaginal technique was performed to assess early pregnancy.

[Series 1: us ob transvaginal · 0.20mm/px · 14 of 57 slices shown]
[im 3/57]
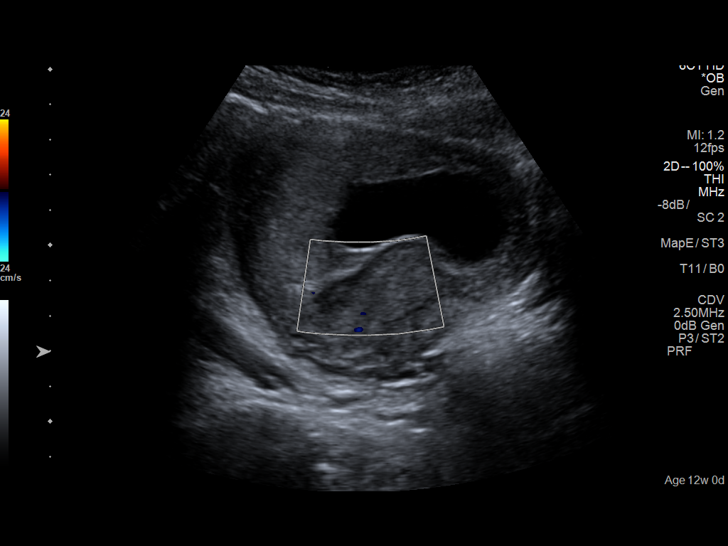
[im 7/57]
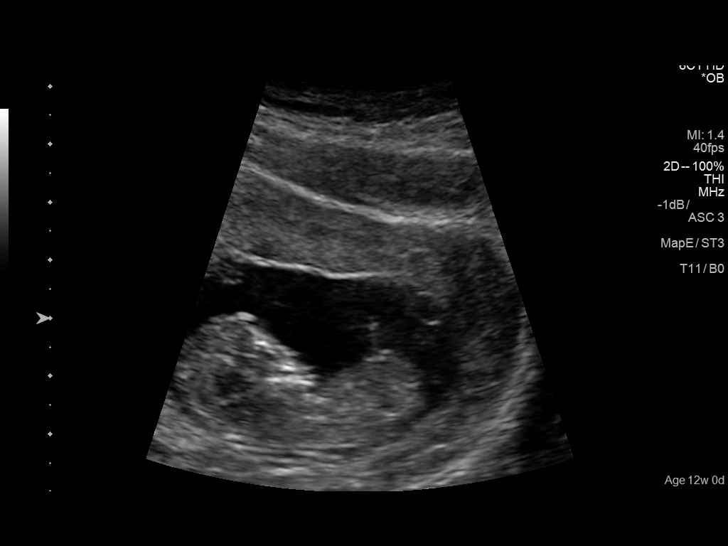
[im 11/57]
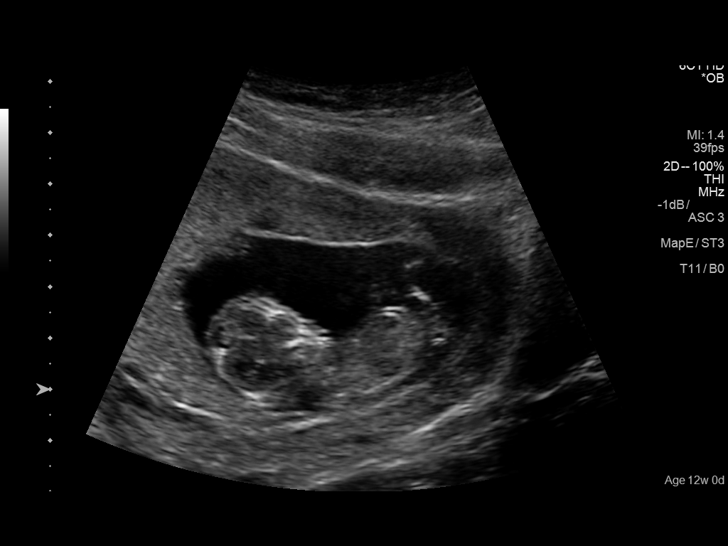
[im 15/57]
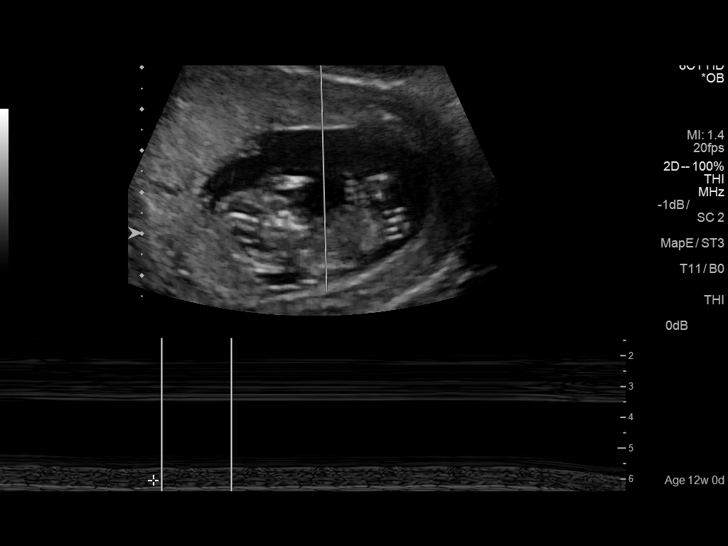
[im 19/57]
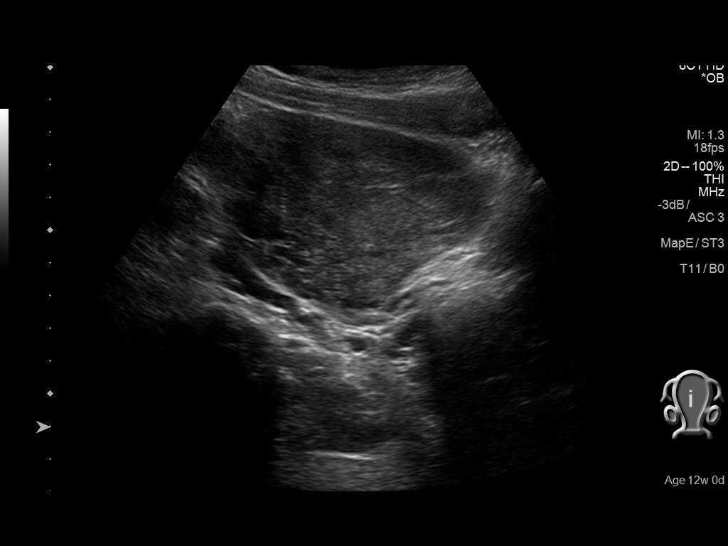
[im 23/57]
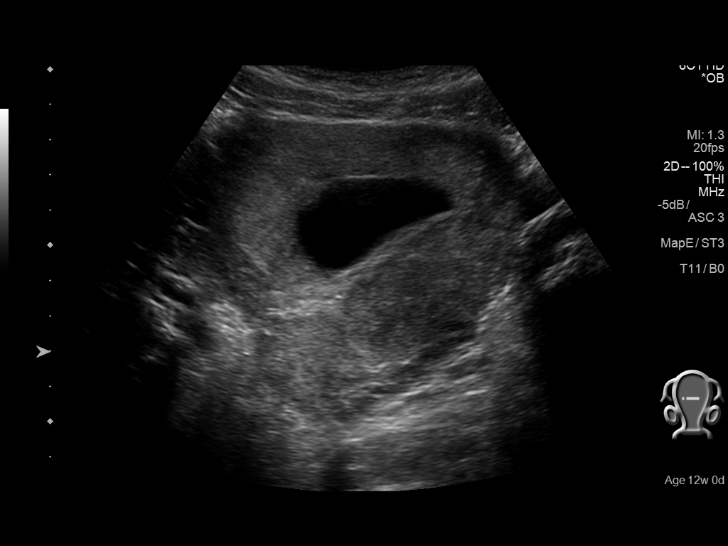
[im 27/57]
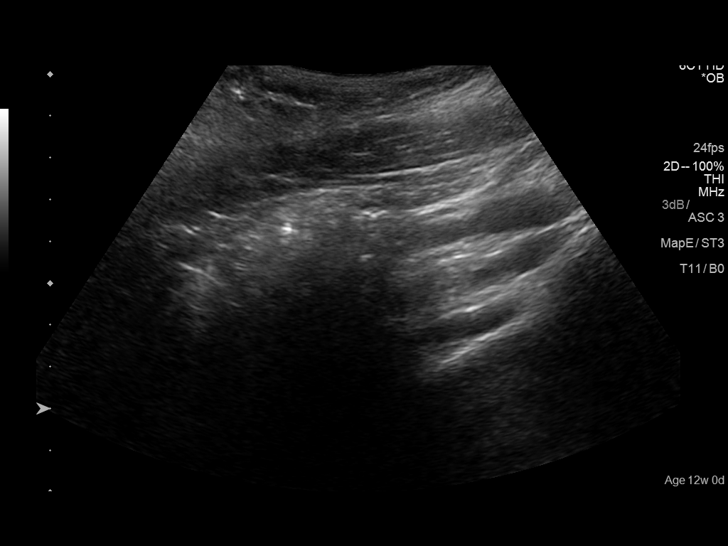
[im 32/57]
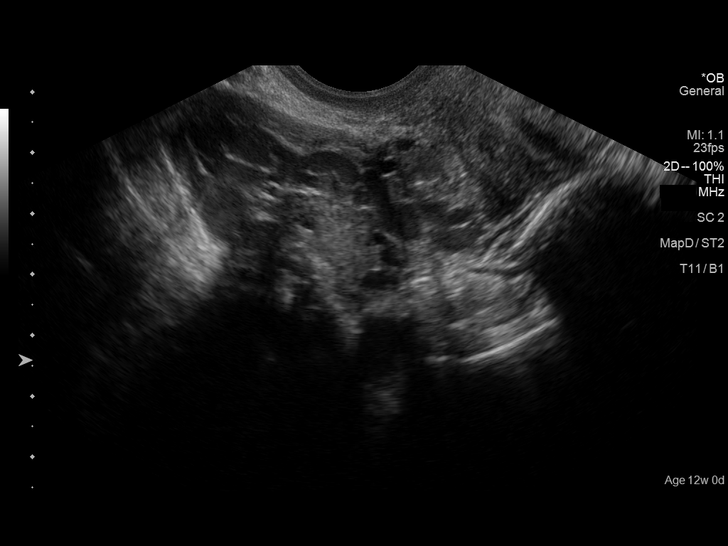
[im 36/57]
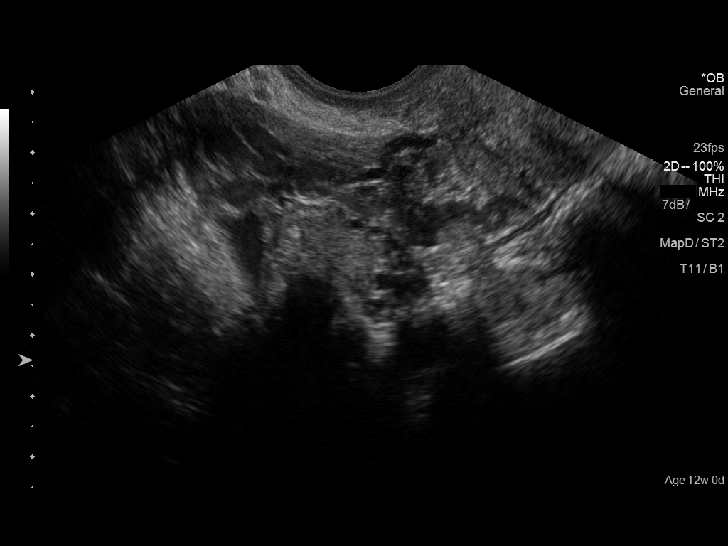
[im 40/57]
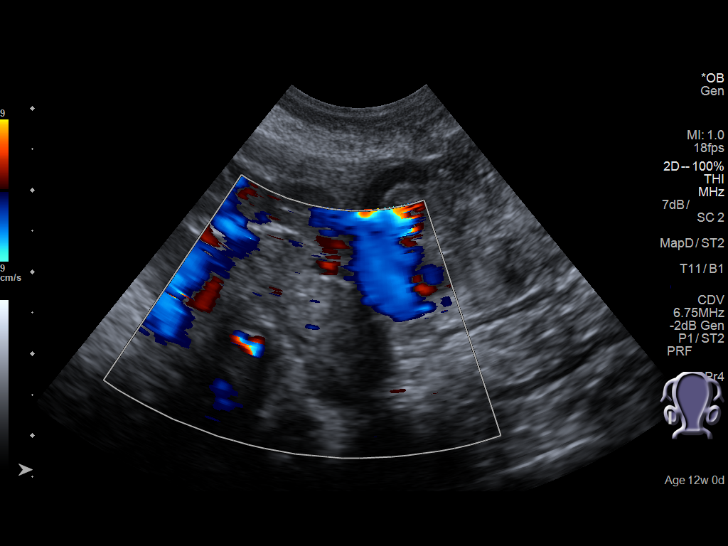
[im 44/57]
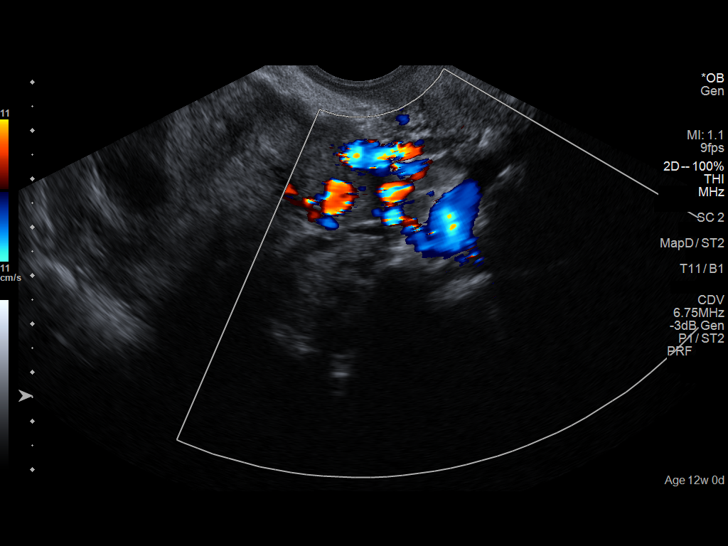
[im 48/57]
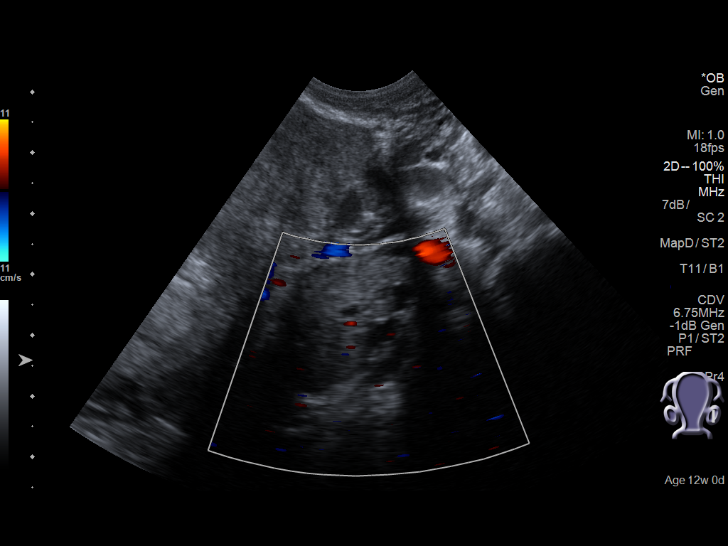
[im 52/57]
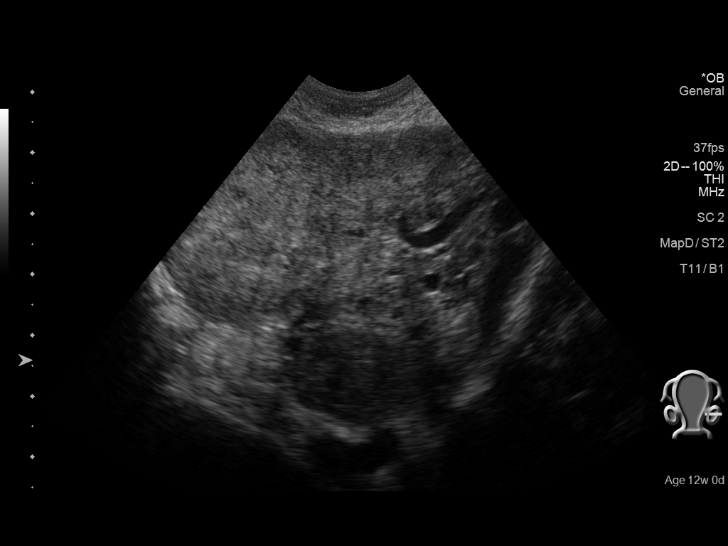
[im 57/57]
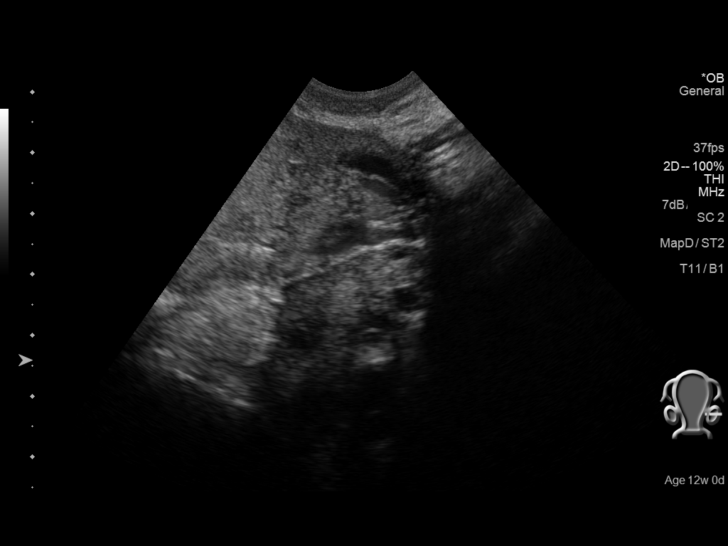

[14 of 28 positions shown; findings below may reference images not displayed]

FINDINGS: Intrauterine gestational sac: Visualized/normal in shape.

Yolk sac:  Yes

Embryo:  Yes

Cardiac Activity: Yes

Heart Rate: 147  bpm

CRL:  48.4  mm   11 w   4 d                  US EDC: 02/14/2016

Subchorionic hemorrhage: Small subchronic hemorrhage measuring
approximately 2.9 x 1.9 x 2.8 cm

Maternal uterus/adnexae: Ovaries and adnexa are unremarkable. No
pelvic free fluid. No uterine masses.
IMPRESSION: 1. Single live intrauterine pregnancy with a measured gestational
age of 11 weeks and 4 days.
2. Small subchronic hemorrhage.  No other pregnancy complication.
3. Ovaries and adnexa are unremarkable.

## 2016-06-19 ENCOUNTER — Emergency Department (HOSPITAL_COMMUNITY)
Admission: EM | Admit: 2016-06-19 | Discharge: 2016-06-19 | Disposition: A | Discharge status: Home / Self Care | Payer: Medicaid Other | Attending: Emergency Medicine | Admitting: Emergency Medicine

## 2016-06-19 ENCOUNTER — Encounter (HOSPITAL_COMMUNITY): Payer: Self-pay

## 2016-06-19 DIAGNOSIS — L0291 Cutaneous abscess, unspecified: Secondary | ICD-10-CM

## 2016-06-19 DIAGNOSIS — F17210 Nicotine dependence, cigarettes, uncomplicated: Secondary | ICD-10-CM | POA: Insufficient documentation

## 2016-06-19 DIAGNOSIS — J45909 Unspecified asthma, uncomplicated: Secondary | ICD-10-CM | POA: Insufficient documentation

## 2016-06-19 DIAGNOSIS — N611 Abscess of the breast and nipple: Secondary | ICD-10-CM | POA: Insufficient documentation

## 2016-06-19 MED ORDER — SULFAMETHOXAZOLE-TRIMETHOPRIM 800-160 MG PO TABS: 2.0000 | ORAL_TABLET | Freq: Two times a day (BID) | ORAL | 0 refills | Status: DC

## 2016-06-19 MED ORDER — HYDROCODONE-ACETAMINOPHEN 5-325 MG PO TABS: 2.0000 | ORAL_TABLET | ORAL | 0 refills | Status: DC | PRN

## 2016-06-19 MED ORDER — HYDROCODONE-ACETAMINOPHEN 5-325 MG PO TABS
1.0000 | ORAL_TABLET | Freq: Once | ORAL | Status: AC
  Administered 2016-06-19: 1 via ORAL
  Filled 2016-06-19: qty 1

## 2016-06-19 MED ORDER — SULFAMETHOXAZOLE-TRIMETHOPRIM 800-160 MG PO TABS
2.0000 | ORAL_TABLET | Freq: Once | ORAL | Status: AC
  Administered 2016-06-19: 2 via ORAL
  Filled 2016-06-19: qty 2

## 2016-06-19 NOTE — ED Triage Notes (Signed)
Patient complains of abscess to left breast with pain, swelling and redness x 1 week. Reports minimal drainage. Denies fever

## 2016-06-19 NOTE — ED Notes (Signed)
Pt stable, understands discharge instructions, and reasons for return.   

## 2016-06-19 NOTE — ED Notes (Signed)
Pt stated that she was in pain.

## 2016-06-19 NOTE — ED Provider Notes (Signed)
[facility] DEPT Provider Note   CSN: [id number] Arrival date & time: 06/19/16  1257     History   Chief Complaint Chief Complaint  Patient presents with  . Abscess    HPI Shirley Turner is a 25 y.o. female. Her chief complaint is pain and redness and swelling to her left breast.  HPI:  Patient states for the last several days she's had pain and redness and now "big blister" in her left nipple on her left breast. States she feels "achy all over. No fever shakes chills or rigors. No past similar episodes.  Past Medical History:  Diagnosis Date  . Anxiety   . Asthma     Patient Active Problem List   Diagnosis Date Noted  . History of depression 02/01/2016  . Postpartum care following vaginal delivery 01/30/2016    Past Surgical History:  Procedure Laterality Date  . TONSILLECTOMY AND ADENOIDECTOMY      OB History    Gravida Para Term Preterm AB Living   2 1       1    SAB TAB Ectopic Multiple Live Births           1       Home Medications    Prior to Admission medications   Medication Sig Start Date End Date Taking? Authorizing Provider  acetaminophen (TYLENOL) 500 MG tablet Take 2 tablets (1,000 mg total) by mouth every 6 (six) hours as needed for mild pain, moderate pain, fever or headache. 02/01/16  Yes Dalia Heading, CNM  albuterol (PROVENTIL HFA;VENTOLIN HFA) 108 (90 BASE) MCG/ACT inhaler Inhale 2 puffs into the lungs every 6 (six) hours as needed for wheezing or shortness of breath. Reported on 01/30/2016   Yes Historical Provider, MD  HYDROcodone-acetaminophen (NORCO/VICODIN) 5-325 MG tablet Take 2 tablets by mouth every 4 (four) hours as needed. 06/19/16   Tanna Furry, MD  sulfamethoxazole-trimethoprim (BACTRIM DS,SEPTRA DS) 800-160 MG tablet Take 2 tablets by mouth 2 (two) times daily. 06/19/16   Tanna Furry, MD    Family History No family history on file.  Social History Social History  Substance Use Topics  . Smoking status: Current Every Day  Smoker    Packs/day: 1.00    Types: Cigarettes  . Smokeless tobacco: Not on file  . Alcohol use No     Allergies   Patient has no known allergies.   Review of Systems Review of Systems  Constitutional: Negative for appetite change, chills, diaphoresis, fatigue and fever.  HENT: Negative for mouth sores, sore throat and trouble swallowing.   Eyes: Negative for visual disturbance.  Respiratory: Negative for cough, chest tightness, shortness of breath and wheezing.   Cardiovascular: Negative for chest pain.  Gastrointestinal: Negative for abdominal distention, abdominal pain, diarrhea, nausea and vomiting.  Endocrine: Negative for polydipsia, polyphagia and polyuria.  Genitourinary: Negative for dysuria, frequency and hematuria.  Musculoskeletal: Negative for gait problem.  Skin: Negative for color change, pallor and rash.       Redness and blister adjacent left nipple and left breast  Neurological: Negative for dizziness, syncope, light-headedness and headaches.  Hematological: Does not bruise/bleed easily.  Psychiatric/Behavioral: Negative for behavioral problems and confusion.     Physical Exam Updated Vital Signs BP 104/79 (BP Location: Right Arm)   Pulse 66   Temp 97.9 F (36.6 C) (Oral)   Resp 14   SpO2 98%   Physical Exam  Constitutional: She is oriented to person, place, and time. She appears well-developed and well-nourished. No  distress.  HENT:  Head: Normocephalic.  Eyes: Conjunctivae are normal. Pupils are equal, round, and reactive to light. No scleral icterus.  Neck: Normal range of motion. Neck supple. No thyromegaly present.  Cardiovascular: Normal rate and regular rhythm.  Exam reveals no gallop and no friction rub.   No murmur heard.   Pulmonary/Chest: Effort normal and breath sounds normal. No respiratory distress. She has no wheezes. She has no rales.  Abdominal: Soft. Bowel sounds are normal. She exhibits no distension. There is no tenderness.  There is no rebound.  Musculoskeletal: Normal range of motion.  Neurological: She is alert and oriented to person, place, and time.  Skin: Skin is warm and dry. No rash noted.  Psychiatric: She has a normal mood and affect. Her behavior is normal.     ED Treatments / Results  Labs (all labs ordered are listed, but only abnormal results are displayed) Labs Reviewed - No data to display  EKG  EKG Interpretation None       Radiology No results found.  Procedures Procedures (including critical care time)  Medications Ordered in ED Medications  HYDROcodone-acetaminophen (NORCO/VICODIN) 5-325 MG per tablet 1 tablet (not administered)  sulfamethoxazole-trimethoprim (BACTRIM DS,SEPTRA DS) 800-160 MG per tablet 2 tablet (not administered)     Initial Impression / Assessment and Plan / ED Course  I have reviewed the triage vital signs and the nursing notes.  Pertinent labs & imaging results that were available during my care of the patient were reviewed by me and considered in my medical decision making (see chart for details).  Clinical Course     INCISION AND DRAINAGE Performed by: [name] Patella Consent: Verbal consent obtained. Risks and benefits: risks, benefits and alternatives were discussed Type: abscess  Body area: Lt breast 9:00  Anesthesia: local infiltration  Incision was made with a scalpel.  Local anesthetic: lidocaine 1% c epinephrine  Anesthetic total: 4 ml  Complexity: complex Blunt dissection to break up loculations  Drainage: purulent  Drainage amount: moderate, 73ml  Packing material: 1/2 in iodoform gauze  Patient tolerance: Patient tolerated the procedure well with no immediate complications.     Final Clinical Impressions(s) / ED Diagnoses   Final diagnoses:  Abscess   Patient given by mouth Bactrim. MRSA dosing. Vicodin for pain. Discharged home. Will soak and remove the gauze in 24-48 hours and begin twice a day soaks or  showers to continue to gently express blood or purulence. Recheck with primary care for resolving.  New Prescriptions New Prescriptions   HYDROCODONE-ACETAMINOPHEN (NORCO/VICODIN) 5-325 MG TABLET    Take 2 tablets by mouth every 4 (four) hours as needed.   SULFAMETHOXAZOLE-TRIMETHOPRIM (BACTRIM DS,SEPTRA DS) 800-160 MG TABLET    Take 2 tablets by mouth 2 (two) times daily.     Tanna Furry, MD 06/19/16 [phone]

## 2016-06-19 NOTE — Discharge Instructions (Signed)
In 24-48 hours soak and remove the gauze.  Continue bathing or showering and gently massage the area to express any additional blood or drainage.  Antibiotics as prescribed.  Wound/abscess cavity should slowly resolve over next 1-2 weeks

## 2016-07-11 ENCOUNTER — Emergency Department
Admission: EM | Admit: 2016-07-11 | Discharge: 2016-07-12 | Disposition: A | Discharge status: Home / Self Care | Payer: Self-pay | Attending: Emergency Medicine | Admitting: Emergency Medicine

## 2016-07-11 ENCOUNTER — Encounter: Payer: Self-pay | Admitting: Emergency Medicine

## 2016-07-11 ENCOUNTER — Emergency Department: Payer: Self-pay

## 2016-07-11 DIAGNOSIS — Z79899 Other long term (current) drug therapy: Secondary | ICD-10-CM | POA: Insufficient documentation

## 2016-07-11 DIAGNOSIS — J45909 Unspecified asthma, uncomplicated: Secondary | ICD-10-CM | POA: Insufficient documentation

## 2016-07-11 DIAGNOSIS — F17210 Nicotine dependence, cigarettes, uncomplicated: Secondary | ICD-10-CM | POA: Insufficient documentation

## 2016-07-11 DIAGNOSIS — K529 Noninfective gastroenteritis and colitis, unspecified: Secondary | ICD-10-CM | POA: Insufficient documentation

## 2016-07-11 LAB — INFLUENZA PANEL BY PCR (TYPE A & B)
INFLAPCR: NEGATIVE
INFLBPCR: NEGATIVE

## 2016-07-11 LAB — POCT PREGNANCY, URINE: PREG TEST UR: NEGATIVE

## 2016-07-11 NOTE — ED Triage Notes (Signed)
Pt to triage via w/c with no distress noted, brought in by EMS, mask in place; EMS reports pt here for flu symptoms and migraine

## 2016-07-11 NOTE — ED Notes (Signed)
Pt placed in sub-wait for privacy and comfort. Warm blanket given and lights dimmed to help with headache.

## 2016-07-12 MED ORDER — ONDANSETRON 4 MG PO TBDP: 4.0000 mg | ORAL_TABLET | Freq: Three times a day (TID) | ORAL | 0 refills | Status: DC | PRN

## 2016-07-12 MED ORDER — ONDANSETRON HCL 4 MG/2ML IJ SOLN
4.0000 mg | Freq: Once | INTRAMUSCULAR | Status: AC
  Administered 2016-07-12: 4 mg via INTRAVENOUS
  Filled 2016-07-12: qty 2

## 2016-07-12 MED ORDER — SODIUM CHLORIDE 0.9 % IV BOLUS (SEPSIS)
1000.0000 mL | Freq: Once | INTRAVENOUS | Status: AC
  Administered 2016-07-12: 1000 mL via INTRAVENOUS

## 2016-07-12 MED ORDER — KETOROLAC TROMETHAMINE 30 MG/ML IJ SOLN
30.0000 mg | Freq: Once | INTRAMUSCULAR | Status: AC
  Administered 2016-07-12: 30 mg via INTRAVENOUS
  Filled 2016-07-12: qty 1

## 2016-07-12 NOTE — ED Provider Notes (Signed)
[facility] Emergency Department Provider Note  Time seen: 11:50 PM  I have reviewed the triage vital signs and the nursing notes.   HISTORY  Chief Complaint Diarrhea; Nausea; and Headache    HPI Shirley Turner is a 25 y.o. female presents via EMS with nausea and one episode of vomiting and 1 episode of nonbloody diarrhea. Patient states that her symptoms started this evening. She states following vomiting she developed a headache. Patient denies any weakness no numbness gait instability or visual changes. Patient admits to multiple sick contacts at same including her mother   Past Medical History:  Diagnosis Date  . Anxiety   . Asthma     Patient Active Problem List   Diagnosis Date Noted  . History of depression 02/01/2016  . Postpartum care following vaginal delivery 01/30/2016    Past Surgical History:  Procedure Laterality Date  . TONSILLECTOMY AND ADENOIDECTOMY      Prior to Admission medications   Medication Sig Start Date End Date Taking? Authorizing Provider  acetaminophen (TYLENOL) 500 MG tablet Take 2 tablets (1,000 mg total) by mouth every 6 (six) hours as needed for mild pain, moderate pain, fever or headache. 02/01/16   Dalia Heading, CNM  albuterol (PROVENTIL HFA;VENTOLIN HFA) 108 (90 BASE) MCG/ACT inhaler Inhale 2 puffs into the lungs every 6 (six) hours as needed for wheezing or shortness of breath. Reported on 01/30/2016    Historical Provider, MD  HYDROcodone-acetaminophen (NORCO/VICODIN) 5-325 MG tablet Take 2 tablets by mouth every 4 (four) hours as needed. 06/19/16   Tanna Furry, MD  sulfamethoxazole-trimethoprim (BACTRIM DS,SEPTRA DS) 800-160 MG tablet Take 2 tablets by mouth 2 (two) times daily. 06/19/16   Tanna Furry, MD    Allergies Patient has no known allergies.  History reviewed. No pertinent family history.  Social History Social History  Substance Use Topics  . Smoking status: Current Every Day Smoker   Packs/day: 1.00    Types: Cigarettes  . Smokeless tobacco: Never Used  . Alcohol use No    Review of Systems Constitutional: No fever/chills Eyes: No visual changes. ENT: No sore throat. Cardiovascular: Denies chest pain. Respiratory: Denies shortness of breath. Gastrointestinal: Positive for vomiting and diarrhea. Genitourinary: Negative for dysuria. Musculoskeletal: Negative for back pain. Skin: Negative for rash. Neurological: Negative for headaches, focal weakness or numbness.  10-point ROS otherwise negative.  ____________________________________________   PHYSICAL EXAM:  VITAL SIGNS: ED Triage Vitals [07/11/16 2132]  Enc Vitals Group     BP 116/73     Pulse Rate 61     Resp 20     Temp 98.4 F (36.9 C)     Temp Source Oral     SpO2 98 %     Weight 169 lb (76.7 kg)     Height 5\' 2"  (1.575 m)     Head Circumference      Peak Flow      Pain Score 10     Pain Loc      Pain Edu?      Excl. in [place]?     Constitutional: Alert and oriented. Well appearing and in no acute distress. Eyes: Conjunctivae are normal. PERRL. EOMI. Head: Atraumatic.Marland Kitchen Mouth/Throat: Mucous membranes are moist.  Oropharynx non-erythematous. Neck: No stridor.  No meningeal signs.   Cardiovascular: Normal rate, regular rhythm. Good peripheral circulation. Grossly normal heart sounds. Respiratory: Normal respiratory effort.  No retractions. Lungs CTAB. Gastrointestinal: Soft and nontender. No distention.  Musculoskeletal: No lower extremity tenderness  nor edema. No gross deformities of extremities. Neurologic:  Normal speech and language. No gross focal neurologic deficits are appreciated.  Skin:  Skin is warm, dry and intact. No rash noted. Psychiatric: Mood and affect are normal. Speech and behavior are normal.  ____________________________________________   LABS (all labs ordered are listed, but only abnormal results are displayed)  Labs Reviewed  INFLUENZA PANEL BY PCR (TYPE A & B,  H1N1)  POC URINE PREG, ED  POCT PREGNANCY, URINE    RADIOLOGY I, [place] N BROWN, personally viewed and evaluated these images (plain radiographs) as part of my medical decision making, as well as reviewing the written report by the radiologist.  Dg Chest 2 View  Result Date: 07/11/2016 CLINICAL DATA:  Initial evaluation for acute cough. History of asthma. EXAM: CHEST  2 VIEW COMPARISON:  Prior radiograph from 01/04/2016. FINDINGS: The cardiac and mediastinal silhouettes are stable in size and contour, and remain within normal limits. The lungs are normally inflated. Mild left basilar atelectasis within the retrocardiac left lower lobe. No airspace consolidation, pleural effusion, or pulmonary edema is identified. There is no pneumothorax. No acute osseous abnormality identified.  Mild scoliosis noted. IMPRESSION: 1. Mild left basilar atelectasis. 2. No other active cardiopulmonary disease identified. Electronically Signed   By: Jeannine Boga M.D.   On: 07/11/2016 22:15      Procedures     INITIAL IMPRESSION / ASSESSMENT AND PLAN / ED COURSE  Pertinent labs & imaging results that were available during my care of the patient were reviewed by me and considered in my medical decision making (see chart for details).  Patient given 1 L IV normal saline, Toradol 30 mg and Zofran 4 mg with improvement of symptoms.   Clinical Course     ____________________________________________  FINAL CLINICAL IMPRESSION(S) / ED DIAGNOSES  Final diagnoses:  Gastroenteritis     MEDICATIONS GIVEN DURING THIS VISIT:  Medications  sodium chloride 0.9 % bolus 1,000 mL (1,000 mLs Intravenous New Bag/Given 07/12/16 0123)  ondansetron (ZOFRAN) injection 4 mg (4 mg Intravenous Given 07/12/16 0123)  ketorolac (TORADOL) 30 MG/ML injection 30 mg (30 mg Intravenous Given 07/12/16 0123)     NEW OUTPATIENT MEDICATIONS STARTED DURING THIS VISIT:  New Prescriptions   No medications on file     Modified Medications   No medications on file    Discontinued Medications   No medications on file     Note:  This document was prepared using Dragon voice recognition software and may include unintentional dictation errors.    Gregor Hams, MD 07/12/16 [phone]

## 2016-09-26 ENCOUNTER — Emergency Department
Admission: EM | Admit: 2016-09-26 | Discharge: 2016-09-26 | Disposition: A | Discharge status: Home / Self Care | Payer: Self-pay | Attending: Emergency Medicine | Admitting: Emergency Medicine

## 2016-09-26 DIAGNOSIS — Z79899 Other long term (current) drug therapy: Secondary | ICD-10-CM | POA: Insufficient documentation

## 2016-09-26 DIAGNOSIS — J45909 Unspecified asthma, uncomplicated: Secondary | ICD-10-CM | POA: Insufficient documentation

## 2016-09-26 DIAGNOSIS — F17210 Nicotine dependence, cigarettes, uncomplicated: Secondary | ICD-10-CM | POA: Insufficient documentation

## 2016-09-26 DIAGNOSIS — H10022 Other mucopurulent conjunctivitis, left eye: Secondary | ICD-10-CM | POA: Insufficient documentation

## 2016-09-26 MED ORDER — GENTAMICIN SULFATE 0.3 % OP SOLN: 1.0000 [drp] | OPHTHALMIC | 0 refills | Status: DC

## 2016-09-26 MED ORDER — IBUPROFEN 600 MG PO TABS: 600.0000 mg | ORAL_TABLET | Freq: Three times a day (TID) | ORAL | 0 refills | Status: DC | PRN

## 2016-09-26 MED ORDER — OLOPATADINE HCL 0.1 % OP SOLN: 1.0000 [drp] | Freq: Two times a day (BID) | OPHTHALMIC | 1 refills | Status: DC

## 2016-09-26 NOTE — ED Triage Notes (Signed)
Pt c/o HA with left eye drainage for the past couple of days.

## 2016-09-26 NOTE — ED Provider Notes (Signed)
[facility] Emergency Department Provider Note   ____________________________________________   First MD Initiated Contact with Patient 09/26/16 1127     (approximate)  I have reviewed the triage vital signs and the nursing notes.   HISTORY  Chief Complaint Eye Drainage and Headache    HPI Shirley Turner is a 26 y.o. female patient complain of headache and left eye drainage for 3 days. Patient stated the last 2 days she went to urgent morning with matted eyelids. Patient denies vision disturbance. He denies any fever associated this complaint. Patient denies pain. Patient also states frontal headache and nasal congestion. No palliative measures for this complaint.   Past Medical History:  Diagnosis Date  . Anxiety   . Asthma     Patient Active Problem List   Diagnosis Date Noted  . History of depression 02/01/2016  . Postpartum care following vaginal delivery 01/30/2016    Past Surgical History:  Procedure Laterality Date  . TONSILLECTOMY AND ADENOIDECTOMY      Prior to Admission medications   Medication Sig Start Date End Date Taking? Authorizing Provider  acetaminophen (TYLENOL) 500 MG tablet Take 2 tablets (1,000 mg total) by mouth every 6 (six) hours as needed for mild pain, moderate pain, fever or headache. 02/01/16   Dalia Heading, CNM  albuterol (PROVENTIL HFA;VENTOLIN HFA) 108 (90 BASE) MCG/ACT inhaler Inhale 2 puffs into the lungs every 6 (six) hours as needed for wheezing or shortness of breath. Reported on 01/30/2016    Historical Provider, MD  gentamicin (GARAMYCIN) 0.3 % ophthalmic solution Place 1 drop into both eyes every 4 (four) hours. 09/26/16   Sable Feil, PA-C  HYDROcodone-acetaminophen (NORCO/VICODIN) 5-325 MG tablet Take 2 tablets by mouth every 4 (four) hours as needed. 06/19/16   Tanna Furry, MD  ibuprofen (ADVIL,MOTRIN) 600 MG tablet Take 1 tablet (600 mg total) by mouth every 8 (eight) hours as needed. 09/26/16    Sable Feil, PA-C  olopatadine (PATANOL) 0.1 % ophthalmic solution Place 1 drop into both eyes 2 (two) times daily. 09/26/16 09/26/17  Sable Feil, PA-C  ondansetron (ZOFRAN ODT) 4 MG disintegrating tablet Take 1 tablet (4 mg total) by mouth every 8 (eight) hours as needed for nausea or vomiting. 07/12/16   Gregor Hams, MD  sulfamethoxazole-trimethoprim (BACTRIM DS,SEPTRA DS) 800-160 MG tablet Take 2 tablets by mouth 2 (two) times daily. 06/19/16   Tanna Furry, MD    Allergies Patient has no known allergies.  No family history on file.  Social History Social History  Substance Use Topics  . Smoking status: Current Every Day Smoker    Packs/day: 1.00    Types: Cigarettes  . Smokeless tobacco: Never Used  . Alcohol use No    Review of Systems Constitutional: No fever/chills Eyes: No visual changes.Matted eyelids and eye redness.  ENT: No sore throat. Cardiovascular: Denies chest pain. Respiratory: Denies shortness of breath. Gastrointestinal: No abdominal pain.  No nausea, no vomiting.  No diarrhea.  No constipation. Genitourinary: Negative for dysuria. Musculoskeletal: Negative for back pain. Skin: Negative for rash. Neurological: Positive denies for headaches, focal weakness or numbness.  ____________________________________________   PHYSICAL EXAM:  VITAL SIGNS: ED Triage Vitals  Enc Vitals Group     BP 09/26/16 1025 135/82     Pulse Rate 09/26/16 1025 65     Resp 09/26/16 1025 18     Temp 09/26/16 1025 98 F (36.7 C)     Temp Source 09/26/16 1025 Oral  SpO2 09/26/16 1025 100 %     Weight 09/26/16 1026 158 lb (71.7 kg)     Height 09/26/16 1026 5\' 3"  (1.6 m)     Head Circumference --      Peak Flow --      Pain Score --      Pain Loc --      Pain Edu? --      Excl. in [place]? --     Constitutional: Alert and oriented. Well appearing and in no acute distress. Eyes:Erythematous left conjunctiva. PERRL and EOM intact dry secretions left eyelids. Head:  Atraumatic. Nose: No congestion/rhinnorhea. Mouth/Throat: Mucous membranes are moist.  Oropharynx non-erythematous. Neck: No stridor.  No cervical spine tenderness to palpation. Hematological/Lymphatic/Immunilogical: No cervical lymphadenopathy. Cardiovascular: Normal rate, regular rhythm. Grossly normal heart sounds.  Good peripheral circulation. Respiratory: Normal respiratory effort.  No retractions. Lungs CTAB. Gastrointestinal: Soft and nontender. No distention. No abdominal bruits. No CVA tenderness. Musculoskeletal: No lower extremity tenderness nor edema.  No joint effusions. Neurologic:  Normal speech and language. No gross focal neurologic deficits are appreciated. No gait instability. Skin:  Skin is warm, dry and intact. No rash noted. Psychiatric: Mood and affect are normal. Speech and behavior are normal.  ____________________________________________   LABS (all labs ordered are listed, but only abnormal results are displayed)  Labs Reviewed - No data to display ____________________________________________  EKG   ____________________________________________  RADIOLOGY   ____________________________________________   PROCEDURES  Procedure(s) performed: None  Procedures  Critical Care performed: No  ____________________________________________   INITIAL IMPRESSION / ASSESSMENT AND PLAN / ED COURSE  Pertinent labs & imaging results that were available during my care of the patient were reviewed by me and considered in my medical decision making (see chart for details).  Conjunctivitis left eye. Patient given discharge care instructions. Patient will work no. Patient given a prescription for gentamicin, Motrin, and Patanol. Patient given a work note. Patient advised follow-up with [facility] if no improvement in 3-5 days.      ____________________________________________   FINAL CLINICAL IMPRESSION(S) / ED DIAGNOSES  Final diagnoses:  Other  mucopurulent conjunctivitis of left eye      NEW MEDICATIONS STARTED DURING THIS VISIT:  New Prescriptions   GENTAMICIN (GARAMYCIN) 0.3 % OPHTHALMIC SOLUTION    Place 1 drop into both eyes every 4 (four) hours.   IBUPROFEN (ADVIL,MOTRIN) 600 MG TABLET    Take 1 tablet (600 mg total) by mouth every 8 (eight) hours as needed.   OLOPATADINE (PATANOL) 0.1 % OPHTHALMIC SOLUTION    Place 1 drop into both eyes 2 (two) times daily.     Note:  This document was prepared using Dragon voice recognition software and may include unintentional dictation errors.    Sable Feil, PA-C 09/26/16 [place], PA-C 09/26/16 1141    [name], MD 09/26/16 1228

## 2016-09-26 NOTE — ED Notes (Signed)
See triage note   States she developed swelling and drainage to left eye on Monday  Also having slight headache

## 2016-09-26 NOTE — ED Notes (Signed)
Gave patient warm blanket.

## 2016-09-27 ENCOUNTER — Encounter: Payer: Self-pay | Admitting: Emergency Medicine

## 2016-09-27 ENCOUNTER — Emergency Department
Admission: EM | Admit: 2016-09-27 | Discharge: 2016-09-27 | Disposition: A | Discharge status: Home / Self Care | Payer: Self-pay | Attending: Emergency Medicine | Admitting: Emergency Medicine

## 2016-09-27 DIAGNOSIS — J45909 Unspecified asthma, uncomplicated: Secondary | ICD-10-CM | POA: Insufficient documentation

## 2016-09-27 DIAGNOSIS — F17210 Nicotine dependence, cigarettes, uncomplicated: Secondary | ICD-10-CM | POA: Insufficient documentation

## 2016-09-27 DIAGNOSIS — G43909 Migraine, unspecified, not intractable, without status migrainosus: Secondary | ICD-10-CM | POA: Insufficient documentation

## 2016-09-27 MED ORDER — ONDANSETRON HCL 4 MG/2ML IJ SOLN
INTRAMUSCULAR | Status: AC
  Filled 2016-09-27: qty 2

## 2016-09-27 MED ORDER — METOCLOPRAMIDE HCL 5 MG/ML IJ SOLN
10.0000 mg | Freq: Once | INTRAMUSCULAR | Status: AC
Start: 2016-09-27 — End: 2016-09-27
  Administered 2016-09-27: 10 mg via INTRAVENOUS
  Filled 2016-09-27: qty 2

## 2016-09-27 MED ORDER — KETOROLAC TROMETHAMINE 30 MG/ML IJ SOLN
30.0000 mg | Freq: Once | INTRAMUSCULAR | Status: AC
  Administered 2016-09-27: 30 mg via INTRAVENOUS
  Filled 2016-09-27: qty 1

## 2016-09-27 MED ORDER — ONDANSETRON HCL 4 MG/2ML IJ SOLN: 4.0000 mg | Freq: Once | INTRAMUSCULAR | Status: DC

## 2016-09-27 MED ORDER — ONDANSETRON 4 MG PO TBDP
ORAL_TABLET | ORAL | Status: AC
  Administered 2016-09-27: 4 mg via ORAL
  Filled 2016-09-27: qty 1

## 2016-09-27 MED ORDER — ONDANSETRON 4 MG PO TBDP
4.0000 mg | ORAL_TABLET | Freq: Once | ORAL | Status: AC
  Administered 2016-09-27: 4 mg via ORAL

## 2016-09-27 NOTE — ED Provider Notes (Signed)
[facility] Emergency Department Provider Note   First MD Initiated Contact with Patient 09/27/16 [phone]     (approximate)  I have reviewed the triage vital signs and the nursing notes.   HISTORY  Chief Complaint Migraine   HPI Shirley Turner is a 26 y.o. female with below list of chronic medical conditions presents to the emergency department "migraine-like headache". Patient states she has a history of migraine headaches which is usually resolved with Excedrin however she did not take any today. Patient states her current pain score is 9 out of 10. Patient denies any weakness numbness or gait instability of note patient was seen earlier today in the emergency department and diagnosed with conjunctivitis of the left eye.   Past Medical History:  Diagnosis Date  . Anxiety   . Asthma     Patient Active Problem List   Diagnosis Date Noted  . History of depression 02/01/2016  . Postpartum care following vaginal delivery 01/30/2016    Past Surgical History:  Procedure Laterality Date  . TONSILLECTOMY AND ADENOIDECTOMY      Prior to Admission medications   Medication Sig Start Date End Date Taking? Authorizing Provider  acetaminophen (TYLENOL) 500 MG tablet Take 2 tablets (1,000 mg total) by mouth every 6 (six) hours as needed for mild pain, moderate pain, fever or headache. 02/01/16   Dalia Heading, CNM  albuterol (PROVENTIL HFA;VENTOLIN HFA) 108 (90 BASE) MCG/ACT inhaler Inhale 2 puffs into the lungs every 6 (six) hours as needed for wheezing or shortness of breath. Reported on 01/30/2016    Historical Provider, MD  gentamicin (GARAMYCIN) 0.3 % ophthalmic solution Place 1 drop into both eyes every 4 (four) hours. 09/26/16   Sable Feil, PA-C  HYDROcodone-acetaminophen (NORCO/VICODIN) 5-325 MG tablet Take 2 tablets by mouth every 4 (four) hours as needed. 06/19/16   Tanna Furry, MD  ibuprofen (ADVIL,MOTRIN) 600 MG tablet Take 1 tablet (600 mg total) by  mouth every 8 (eight) hours as needed. 09/26/16   Sable Feil, PA-C  olopatadine (PATANOL) 0.1 % ophthalmic solution Place 1 drop into both eyes 2 (two) times daily. 09/26/16 09/26/17  Sable Feil, PA-C  ondansetron (ZOFRAN ODT) 4 MG disintegrating tablet Take 1 tablet (4 mg total) by mouth every 8 (eight) hours as needed for nausea or vomiting. 07/12/16   Gregor Hams, MD  sulfamethoxazole-trimethoprim (BACTRIM DS,SEPTRA DS) 800-160 MG tablet Take 2 tablets by mouth 2 (two) times daily. 06/19/16   Tanna Furry, MD    Allergies Patient has no known allergies.  History reviewed. No pertinent family history.  Social History Social History  Substance Use Topics  . Smoking status: Current Every Day Smoker    Packs/day: 1.00    Types: Cigarettes  . Smokeless tobacco: Never Used  . Alcohol use No    Review of Systems Constitutional: No fever/chills Eyes: No visual changes. ENT: No sore throat. Cardiovascular: Denies chest pain. Respiratory: Denies shortness of breath. Gastrointestinal: No abdominal pain.  No nausea, no vomiting.  No diarrhea.  No constipation. Genitourinary: Negative for dysuria. Musculoskeletal: Negative for back pain. Skin: Negative for rash. Neurological: Negative for headaches, focal weakness or numbness.  10-point ROS otherwise negative.  ____________________________________________   PHYSICAL EXAM:  VITAL SIGNS: ED Triage Vitals  Enc Vitals Group     BP 09/27/16 0049 (!) 124/97     Pulse Rate 09/27/16 0049 (!) 51     Resp 09/27/16 0049 17     Temp  09/27/16 0049 98.1 F (36.7 C)     Temp Source 09/27/16 0049 Oral     SpO2 09/27/16 0049 100 %     Weight 09/27/16 0049 158 lb (71.7 kg)     Height 09/27/16 0049 5\' 3"  (1.6 m)     Head Circumference --      Peak Flow --      Pain Score 09/27/16 0325 7     Pain Loc --      Pain Edu? --      Excl. in [place]? --     Constitutional: Alert and oriented. Well appearing and in no acute distress. Eyes:  Left conjunctivae injection and erythema. PERRL. EOMI. Head: Atraumatic. Ears:  Healthy appearing ear canals and TMs bilaterally Nose: No congestion/rhinnorhea. Mouth/Throat: Mucous membranes are moist. Oropharynx non-erythematous. Neck: No stridor.  No meningeal signs.  No cervical spine tenderness to palpation. Cardiovascular: Normal rate, regular rhythm. Good peripheral circulation. Grossly normal heart sounds. Respiratory: Normal respiratory effort.  No retractions. Lungs CTAB. Gastrointestinal: Soft and nontender. No distention.   Musculoskeletal: No lower extremity tenderness nor edema. No gross deformities of extremities. Neurologic:  Normal speech and language. No gross focal neurologic deficits are appreciated.  Skin:  Skin is warm, dry and intact. No rash noted.      Procedures     INITIAL IMPRESSION / ASSESSMENT AND PLAN / ED COURSE  Pertinent labs & imaging results that were available during my care of the patient were reviewed by me and considered in my medical decision making (see chart for details).   Patient given Toradol and Reglan with complete resolution of headache before discharge.     ____________________________________________  FINAL CLINICAL IMPRESSION(S) / ED DIAGNOSES  Final diagnoses:  Migraine without status migrainosus, not intractable, unspecified migraine type     MEDICATIONS GIVEN DURING THIS VISIT:  Medications  ketorolac (TORADOL) 30 MG/ML injection 30 mg (30 mg Intravenous Given 09/27/16 0527)  metoCLOPramide (REGLAN) injection 10 mg (10 mg Intravenous Given 09/27/16 0527)  ondansetron (ZOFRAN-ODT) disintegrating tablet 4 mg (4 mg Oral Given 09/27/16 0548)     NEW OUTPATIENT MEDICATIONS STARTED DURING THIS VISIT:  Discharge Medication List as of 09/27/2016  6:30 AM      Discharge Medication List as of 09/27/2016  6:30 AM      Discharge Medication List as of 09/27/2016  6:30 AM       Note:  This document was prepared using Dragon  voice recognition software and may include unintentional dictation errors.    Gregor Hams, MD 09/28/16 [phone]

## 2016-09-27 NOTE — ED Triage Notes (Signed)
Pt to triage by EMS with c/o of migraine like symptoms that started after leaving ED earlier today for conjunctivitis. Pt reports headache and nausea.

## 2016-09-27 NOTE — ED Notes (Signed)
MD asked this nurse to give 4mg  IV zofran to pt, nurse retrieved IV zofran and went in pt's room. Pt is coughing and hovering over toilet. Pt states she took out IV due to "it hurt." MD notified, medication switched to ODT. Pt given OCT zofran 4mg , able to dissolve in mouth.

## 2016-12-13 LAB — HM PAP SMEAR: HM Pap smear: NEGATIVE

## 2017-01-09 ENCOUNTER — Emergency Department
Admission: EM | Admit: 2017-01-09 | Discharge: 2017-01-09 | Disposition: A | Discharge status: Home / Self Care | Payer: Self-pay | Attending: Emergency Medicine | Admitting: Emergency Medicine

## 2017-01-09 ENCOUNTER — Encounter: Payer: Self-pay | Admitting: *Deleted

## 2017-01-09 DIAGNOSIS — A549 Gonococcal infection, unspecified: Secondary | ICD-10-CM

## 2017-01-09 DIAGNOSIS — Z791 Long term (current) use of non-steroidal anti-inflammatories (NSAID): Secondary | ICD-10-CM | POA: Insufficient documentation

## 2017-01-09 DIAGNOSIS — J45909 Unspecified asthma, uncomplicated: Secondary | ICD-10-CM | POA: Insufficient documentation

## 2017-01-09 DIAGNOSIS — Z224 Carrier of infections with a predominantly sexual mode of transmission: Secondary | ICD-10-CM | POA: Insufficient documentation

## 2017-01-09 DIAGNOSIS — Z79899 Other long term (current) drug therapy: Secondary | ICD-10-CM | POA: Insufficient documentation

## 2017-01-09 DIAGNOSIS — N730 Acute parametritis and pelvic cellulitis: Secondary | ICD-10-CM

## 2017-01-09 DIAGNOSIS — F17210 Nicotine dependence, cigarettes, uncomplicated: Secondary | ICD-10-CM | POA: Insufficient documentation

## 2017-01-09 DIAGNOSIS — N739 Female pelvic inflammatory disease, unspecified: Secondary | ICD-10-CM | POA: Insufficient documentation

## 2017-01-09 LAB — URINALYSIS, COMPLETE (UACMP) WITH MICROSCOPIC
BACTERIA UA: NONE SEEN
Bilirubin Urine: NEGATIVE
Glucose, UA: NEGATIVE mg/dL
Ketones, ur: NEGATIVE mg/dL
NITRITE: NEGATIVE
PROTEIN: 30 mg/dL — AB
SPECIFIC GRAVITY, URINE: 1.02 (ref 1.005–1.030)
pH: 7.5 (ref 5.0–8.0)

## 2017-01-09 LAB — BASIC METABOLIC PANEL
Anion gap: 8 (ref 5–15)
BUN: 9 mg/dL (ref 6–20)
CHLORIDE: 104 mmol/L (ref 101–111)
CO2: 21 mmol/L — ABNORMAL LOW (ref 22–32)
CREATININE: 0.64 mg/dL (ref 0.44–1.00)
Calcium: 8.6 mg/dL — ABNORMAL LOW (ref 8.9–10.3)
Glucose, Bld: 102 mg/dL — ABNORMAL HIGH (ref 65–99)
Potassium: 3.5 mmol/L (ref 3.5–5.1)
SODIUM: 133 mmol/L — AB (ref 135–145)

## 2017-01-09 LAB — CBC WITH DIFFERENTIAL/PLATELET
BASOS PCT: 0 %
Basophils Absolute: 0.1 10*3/uL (ref 0–0.1)
EOS PCT: 1 %
Eosinophils Absolute: 0.1 10*3/uL (ref 0–0.7)
HCT: 38.9 % (ref 35.0–47.0)
HEMOGLOBIN: 12.9 g/dL (ref 12.0–16.0)
Lymphocytes Relative: 21 %
Lymphs Abs: 2.6 10*3/uL (ref 1.0–3.6)
MCH: 29.1 pg (ref 26.0–34.0)
MCHC: 33.2 g/dL (ref 32.0–36.0)
MCV: 87.8 fL (ref 80.0–100.0)
Monocytes Absolute: 0.8 10*3/uL (ref 0.2–0.9)
Monocytes Relative: 6 %
NEUTROS PCT: 72 %
Neutro Abs: 9.1 10*3/uL — ABNORMAL HIGH (ref 1.4–6.5)
PLATELETS: 294 10*3/uL (ref 150–440)
RBC: 4.43 MIL/uL (ref 3.80–5.20)
RDW: 15.1 % — ABNORMAL HIGH (ref 11.5–14.5)
WBC: 12.6 10*3/uL — AB (ref 3.6–11.0)

## 2017-01-09 LAB — POCT PREGNANCY, URINE: Preg Test, Ur: NEGATIVE

## 2017-01-09 LAB — WET PREP, GENITAL
CLUE CELLS WET PREP: NONE SEEN
SPERM: NONE SEEN
TRICH WET PREP: NONE SEEN
YEAST WET PREP: NONE SEEN

## 2017-01-09 LAB — CHLAMYDIA/NGC RT PCR (ARMC ONLY)
CHLAMYDIA TR: NOT DETECTED
N gonorrhoeae: DETECTED — AB

## 2017-01-09 MED ORDER — LIDOCAINE HCL (PF) 1 % IJ SOLN
INTRAMUSCULAR | Status: AC
  Administered 2017-01-09: 5 mL
  Filled 2017-01-09: qty 5

## 2017-01-09 MED ORDER — CEFTRIAXONE SODIUM 250 MG IJ SOLR
250.0000 mg | INTRAMUSCULAR | Status: DC
  Administered 2017-01-09: 250 mg via INTRAMUSCULAR
  Filled 2017-01-09: qty 250

## 2017-01-09 MED ORDER — DOXYCYCLINE HYCLATE 100 MG PO TABS
100.0000 mg | ORAL_TABLET | Freq: Once | ORAL | Status: AC
  Administered 2017-01-09: 100 mg via ORAL
  Filled 2017-01-09: qty 1

## 2017-01-09 MED ORDER — KETOROLAC TROMETHAMINE 30 MG/ML IJ SOLN
15.0000 mg | Freq: Once | INTRAMUSCULAR | Status: AC
  Administered 2017-01-09: 15 mg via INTRAVENOUS
  Filled 2017-01-09: qty 1

## 2017-01-09 MED ORDER — DOXYCYCLINE HYCLATE 100 MG PO CAPS: 100.0000 mg | ORAL_CAPSULE | Freq: Two times a day (BID) | ORAL | 0 refills | Status: AC

## 2017-01-09 MED ORDER — NAPROXEN 500 MG PO TABS: 500.0000 mg | ORAL_TABLET | Freq: Two times a day (BID) | ORAL | 0 refills | Status: DC

## 2017-01-09 NOTE — ED Triage Notes (Signed)
Pt arrives via EMS with complaints of lower abd, pelvic pain, states foul smelling urine with green vaginal discharge, states lower back pain, states unprotected sex, EDP at bedside

## 2017-01-09 NOTE — ED Notes (Signed)
Pt resting in bed, resp even and unlabored 

## 2017-01-09 NOTE — ED Provider Notes (Signed)
[facility] Emergency Department Provider Note  ____________________________________________  Time seen: Approximately 9:42 AM  I have reviewed the triage vital signs and the nursing notes.   HISTORY  Chief Complaint Pelvic Pain and Vaginal Discharge   HPI Shirley Turner is a 26 y.o. female no significant past medical history who presents for evaluation of vaginal discharge and abdominal pain. Patient reports that she has been having unprotected sex with a female partner who was having sex with other women. She was told by him last week that he was having penile discharge. She has had 1 week of green vaginal discharge that she describes as large amount daily. She is complaining of pain in her vagina and also lower abdominal pain radiating to her back. Her pain is severe, dull and constant. No dysuria or hematuria, no constipation or diarrhea, no fever or chills, no nausea or vomiting. LMP 2 weeks ago.   Past Medical History:  Diagnosis Date  . Anxiety   . Asthma     Patient Active Problem List   Diagnosis Date Noted  . History of depression 02/01/2016  . Postpartum care following vaginal delivery 01/30/2016    Past Surgical History:  Procedure Laterality Date  . TONSILLECTOMY AND ADENOIDECTOMY      Prior to Admission medications   Medication Sig Start Date End Date Taking? Authorizing Provider  acetaminophen (TYLENOL) 500 MG tablet Take 2 tablets (1,000 mg total) by mouth every 6 (six) hours as needed for mild pain, moderate pain, fever or headache. 02/01/16   Dalia Heading, CNM  albuterol (PROVENTIL HFA;VENTOLIN HFA) 108 (90 BASE) MCG/ACT inhaler Inhale 2 puffs into the lungs every 6 (six) hours as needed for wheezing or shortness of breath. Reported on 01/30/2016    [provider]  doxycycline (VIBRAMYCIN) 100 MG capsule Take 1 capsule (100 mg total) by mouth 2 (two) times daily. 01/09/17 01/19/17  Rudene Re, MD  gentamicin  (GARAMYCIN) 0.3 % ophthalmic solution Place 1 drop into both eyes every 4 (four) hours. 09/26/16   Sable Feil, PA-C  HYDROcodone-acetaminophen (NORCO/VICODIN) 5-325 MG tablet Take 2 tablets by mouth every 4 (four) hours as needed. 06/19/16   Tanna Furry, MD  ibuprofen (ADVIL,MOTRIN) 600 MG tablet Take 1 tablet (600 mg total) by mouth every 8 (eight) hours as needed. 09/26/16   Sable Feil, PA-C  naproxen (NAPROSYN) 500 MG tablet Take 1 tablet (500 mg total) by mouth 2 (two) times daily with a meal. 01/09/17 01/09/18  Alfred Levins, [place], MD  olopatadine (PATANOL) 0.1 % ophthalmic solution Place 1 drop into both eyes 2 (two) times daily. 09/26/16 09/26/17  Sable Feil, PA-C  ondansetron (ZOFRAN ODT) 4 MG disintegrating tablet Take 1 tablet (4 mg total) by mouth every 8 (eight) hours as needed for nausea or vomiting. 07/12/16   Gregor Hams, MD  sulfamethoxazole-trimethoprim (BACTRIM DS,SEPTRA DS) 800-160 MG tablet Take 2 tablets by mouth 2 (two) times daily. 06/19/16   Tanna Furry, MD    Allergies Patient has no known allergies.  History reviewed. No pertinent family history.  Social History Social History  Substance Use Topics  . Smoking status: Current Every Day Smoker    Packs/day: 1.00    Types: Cigarettes  . Smokeless tobacco: Never Used  . Alcohol use No    Review of Systems  Constitutional: Negative for fever. Eyes: Negative for visual changes. ENT: Negative for sore throat. Neck: No neck pain  Cardiovascular: Negative for chest pain. Respiratory: Negative  for shortness of breath. Gastrointestinal: + abdominal pain. No vomiting or diarrhea. Genitourinary: Negative for dysuria. + vaginal discharge and vaginal pain Musculoskeletal: Negative for back pain. Skin: Negative for rash. Neurological: Negative for headaches, weakness or numbness. Psych: No SI or HI  ____________________________________________   PHYSICAL EXAM:  VITAL SIGNS: ED Triage Vitals  Enc  Vitals Group     BP 01/09/17 0940 137/89     Pulse Rate 01/09/17 0940 83     Resp 01/09/17 0940 16     Temp 01/09/17 0940 98.5 F (36.9 C)     Temp Source 01/09/17 0940 Oral     SpO2 01/09/17 0940 100 %     Weight 01/09/17 0941 154 lb (69.9 kg)     Height 01/09/17 0941 5\' 2"  (1.575 m)     Head Circumference --      Peak Flow --      Pain Score 01/09/17 0938 10     Pain Loc --      Pain Edu? --      Excl. in [place]? --     Constitutional: Alert and oriented. Well appearing and in no apparent distress. HEENT:      Head: Normocephalic and atraumatic.         Eyes: Conjunctivae are normal. Sclera is non-icteric.       Mouth/Throat: Mucous membranes are moist.       Neck: Supple with no signs of meningismus. Cardiovascular: Regular rate and rhythm. No murmurs, gallops, or rubs. 2+ symmetrical distal pulses are present in all extremities. No JVD. Respiratory: Normal respiratory effort. Lungs are clear to auscultation bilaterally. No wheezes, crackles, or rhonchi.  Gastrointestinal: Soft, ttp over the low quadrants worse in the suprapubic region, and non distended with positive bowel sounds. No rebound or guarding. Pelvic exam: Normal external genitalia, no rashes or lesions. Watery green copious discharge. Os closed. Positive cervical motion tenderness.  No adnexal tenderness.   Genitourinary: No CVA tenderness. Musculoskeletal: Nontender with normal range of motion in all extremities. No edema, cyanosis, or erythema of extremities. Neurologic: Normal speech and language. Face is symmetric. Moving all extremities. No gross focal neurologic deficits are appreciated. Skin: Skin is warm, dry and intact. No rash noted. Psychiatric: Mood and affect are normal. Speech and behavior are normal.  ____________________________________________   LABS (all labs ordered are listed, but only abnormal results are displayed)  Labs Reviewed  CHLAMYDIA/NGC RT PCR ([facility] ONLY) - Abnormal; Notable  for the following:       Result Value   N gonorrhoeae DETECTED (*)    All other components within normal limits  WET PREP, GENITAL - Abnormal; Notable for the following:    WBC, Wet Prep HPF POC MANY (*)    All other components within normal limits  CBC WITH DIFFERENTIAL/PLATELET - Abnormal; Notable for the following:    WBC 12.6 (*)    RDW 15.1 (*)    Neutro Abs 9.1 (*)    All other components within normal limits  BASIC METABOLIC PANEL - Abnormal; Notable for the following:    Sodium 133 (*)    CO2 21 (*)    Glucose, Bld 102 (*)    Calcium 8.6 (*)    All other components within normal limits  URINALYSIS, COMPLETE (UACMP) WITH MICROSCOPIC - Abnormal; Notable for the following:    APPearance HAZY (*)    Hgb urine dipstick TRACE (*)    Protein, ur 30 (*)    Leukocytes, UA MODERATE (*)  Squamous Epithelial / LPF 0-5 (*)    All other components within normal limits  POCT PREGNANCY, URINE   ____________________________________________  EKG  none  ____________________________________________  RADIOLOGY  none  ____________________________________________   PROCEDURES  Procedure(s) performed: None Procedures Critical Care performed:  None ____________________________________________   INITIAL IMPRESSION / ASSESSMENT AND PLAN / ED COURSE  26 y.o. female no significant past medical history who presents for evaluation of vaginal discharge and abdominal pain after having unprotected sex with female partner who has had similar symptoms. Patient is well-appearing, in no distress, normal vital signs, she has tenderness diffusely in her lower abdomen with no rebound or guarding. Differential diagnoses including STD versus PID versus tubo-ovarian abscess. We'll check a pregnancy test, urinalysis, do a pelvic exam and send a wet prep, Chlamydia and gonorrhea. We'll check basic blood work. We'll treat her pain with Toradol.  Clinical Course as of Jan 13 1143  Wed Jan 09, 2017    1015 Pelvic exam with copious watery green discharge and significant cervical motion tenderness. No adnexal tenderness. Patient will be given 250 mg of IM Rocephin and doxycycline twice a day for 10 days. Discussed safe sex instructions with patient.  [CV]  1109 Wet prep negative for Trichomonas. Patient to be discharged home on doxycycline. Discussed return precautions for any signs of worsening infection or unilateral abdominal pain, fever. Recommended return to the emergency room if these develop.  [CV]    Clinical Course User Index [CV] Rudene Re, MD    Pertinent labs & imaging results that were available during my care of the patient were reviewed by me and considered in my medical decision making (see chart for details).    ____________________________________________   FINAL CLINICAL IMPRESSION(S) / ED DIAGNOSES  Final diagnoses:  PID (acute pelvic inflammatory disease)  Gonorrhea      NEW MEDICATIONS STARTED DURING THIS VISIT:  Discharge Medication List as of 01/09/2017 11:09 AM    START taking these medications   Details  doxycycline (VIBRAMYCIN) 100 MG capsule Take 1 capsule (100 mg total) by mouth 2 (two) times daily., Starting Wed 01/09/2017, Until Sat 01/19/2017, Print    naproxen (NAPROSYN) 500 MG tablet Take 1 tablet (500 mg total) by mouth 2 (two) times daily with a meal., Starting Wed 01/09/2017, Until Thu 01/09/2018, Print         Note:  This document was prepared using Dragon voice recognition software and may include unintentional dictation errors.    Alfred Levins, [place], MD 01/12/17 [phone]

## 2017-01-10 ENCOUNTER — Telehealth: Payer: Self-pay | Admitting: Emergency Medicine

## 2017-01-10 NOTE — Telephone Encounter (Signed)
Called patient to inform of std results.  She says partner has already been treated.  She does plan to follow up for herself since dx was pid.

## 2017-08-30 ENCOUNTER — Emergency Department
Admission: EM | Admit: 2017-08-30 | Discharge: 2017-08-30 | Disposition: A | Discharge status: Home / Self Care | Payer: Self-pay | Attending: Emergency Medicine | Admitting: Emergency Medicine

## 2017-08-30 ENCOUNTER — Encounter: Payer: Self-pay | Admitting: Emergency Medicine

## 2017-08-30 ENCOUNTER — Other Ambulatory Visit: Payer: Self-pay

## 2017-08-30 DIAGNOSIS — G43909 Migraine, unspecified, not intractable, without status migrainosus: Secondary | ICD-10-CM | POA: Insufficient documentation

## 2017-08-30 DIAGNOSIS — J45909 Unspecified asthma, uncomplicated: Secondary | ICD-10-CM | POA: Insufficient documentation

## 2017-08-30 DIAGNOSIS — Z79899 Other long term (current) drug therapy: Secondary | ICD-10-CM | POA: Insufficient documentation

## 2017-08-30 DIAGNOSIS — F17210 Nicotine dependence, cigarettes, uncomplicated: Secondary | ICD-10-CM | POA: Insufficient documentation

## 2017-08-30 LAB — POCT PREGNANCY, URINE: Preg Test, Ur: NEGATIVE

## 2017-08-30 MED ORDER — DIPHENHYDRAMINE HCL 50 MG/ML IJ SOLN
25.0000 mg | Freq: Once | INTRAMUSCULAR | Status: AC
  Administered 2017-08-30: 25 mg via INTRAVENOUS
  Filled 2017-08-30: qty 1

## 2017-08-30 MED ORDER — METOCLOPRAMIDE HCL 5 MG/ML IJ SOLN: 20.0000 mg | Freq: Once | INTRAVENOUS | Status: DC

## 2017-08-30 MED ORDER — SODIUM CHLORIDE 0.9 % IV SOLN
1000.0000 mL | Freq: Once | INTRAVENOUS | Status: AC
  Administered 2017-08-30: 1000 mL via INTRAVENOUS

## 2017-08-30 MED ORDER — METOCLOPRAMIDE HCL 5 MG/ML IJ SOLN
INTRAMUSCULAR | Status: AC
  Administered 2017-08-30: 20 mg
  Filled 2017-08-30: qty 4

## 2017-08-30 MED ORDER — BUTALBITAL-APAP-CAFFEINE 50-325-40 MG PO TABS: 1.0000 | ORAL_TABLET | Freq: Four times a day (QID) | ORAL | 0 refills | Status: DC | PRN

## 2017-08-30 MED ORDER — KETOROLAC TROMETHAMINE 30 MG/ML IJ SOLN
30.0000 mg | Freq: Once | INTRAMUSCULAR | Status: AC
  Administered 2017-08-30: 30 mg via INTRAVENOUS
  Filled 2017-08-30: qty 1

## 2017-08-30 NOTE — ED Provider Notes (Signed)
[facility] Emergency Department Provider Note   ____________________________________________    I have reviewed the triage vital signs and the nursing notes.   HISTORY  Chief Complaint Headache     HPI Shirley Turner is a 27 y.o. female who presents with complaints of a migraine headache.  Patient reports a history of migraine headaches and states that her symptoms today feel exactly as her usual migraines which is a diffuse throbbing sensation and sensitivity to light.  No neuro deficits.  No fevers or chills.  No sick contacts.  No neck pain.  Positive nausea.  She has taken over-the-counter medications without improvement.  Symptoms have been ongoing for several days   Past Medical History:  Diagnosis Date  . Anxiety   . Asthma     Patient Active Problem List   Diagnosis Date Noted  . History of depression 02/01/2016  . Postpartum care following vaginal delivery 01/30/2016    Past Surgical History:  Procedure Laterality Date  . TONSILLECTOMY AND ADENOIDECTOMY      Prior to Admission medications   Medication Sig Start Date End Date Taking? Authorizing Provider  acetaminophen (TYLENOL) 500 MG tablet Take 2 tablets (1,000 mg total) by mouth every 6 (six) hours as needed for mild pain, moderate pain, fever or headache. 02/01/16   Dalia Heading, CNM  albuterol (PROVENTIL HFA;VENTOLIN HFA) 108 (90 BASE) MCG/ACT inhaler Inhale 2 puffs into the lungs every 6 (six) hours as needed for wheezing or shortness of breath. Reported on 01/30/2016    [provider]  butalbital-acetaminophen-caffeine (FIORICET, ESGIC) 50-325-40 MG tablet Take 1-2 tablets by mouth every 6 (six) hours as needed for headache. 08/30/17 08/30/18  Lavonia Drafts, MD  gentamicin (GARAMYCIN) 0.3 % ophthalmic solution Place 1 drop into both eyes every 4 (four) hours. 09/26/16   Sable Feil, PA-C  HYDROcodone-acetaminophen (NORCO/VICODIN) 5-325 MG tablet Take 2 tablets by  mouth every 4 (four) hours as needed. 06/19/16   Tanna Furry, MD  ibuprofen (ADVIL,MOTRIN) 600 MG tablet Take 1 tablet (600 mg total) by mouth every 8 (eight) hours as needed. 09/26/16   Sable Feil, PA-C  naproxen (NAPROSYN) 500 MG tablet Take 1 tablet (500 mg total) by mouth 2 (two) times daily with a meal. 01/09/17 01/09/18  Alfred Levins, [place], MD  olopatadine (PATANOL) 0.1 % ophthalmic solution Place 1 drop into both eyes 2 (two) times daily. 09/26/16 09/26/17  Sable Feil, PA-C  ondansetron (ZOFRAN ODT) 4 MG disintegrating tablet Take 1 tablet (4 mg total) by mouth every 8 (eight) hours as needed for nausea or vomiting. 07/12/16   Gregor Hams, MD  sulfamethoxazole-trimethoprim (BACTRIM DS,SEPTRA DS) 800-160 MG tablet Take 2 tablets by mouth 2 (two) times daily. 06/19/16   Tanna Furry, MD     Allergies Patient has no known allergies.  No family history on file.  Social History Social History   Tobacco Use  . Smoking status: Current Every Day Smoker    Packs/day: 1.00    Types: Cigarettes  . Smokeless tobacco: Never Used  Substance Use Topics  . Alcohol use: No  . Drug use: Yes    Types: Marijuana    Review of Systems  Constitutional: No fever/chills Eyes: Photosensitivity ENT: No pain Cardiovascular: Denies chest pain. Respiratory: Denies cough Gastrointestinal: Positive nausea Genitourinary: Negative for dysuria. Musculoskeletal: Negative for back pain. Skin: Negative for rash. Neurological: Negative for weakness   ____________________________________________   PHYSICAL EXAM:  VITAL SIGNS: ED Triage Vitals  Enc Vitals Group     BP 08/30/17 1045 (!) 138/98     Pulse Rate 08/30/17 1045 (!) 56     Resp 08/30/17 1045 20     Temp 08/30/17 1045 97.9 F (36.6 C)     Temp Source 08/30/17 1045 Oral     SpO2 08/30/17 1045 98 %     Weight 08/30/17 1019 69.9 kg (154 lb)     Height 08/30/17 1046 1.6 m (5\' 3" )     Head Circumference --      Peak Flow --       Pain Score 08/30/17 1018 10     Pain Loc --      Pain Edu? --      Excl. in [place]? --     Constitutional: Alert and oriented. No acute distress.  Eyes: Conjunctivae are normal.  Head: Atraumatic. Nose: No congestion/rhinnorhea. Mouth/Throat: Mucous membranes are moist.   Neck:  Painless ROM, no vertebral tenderness to palpation Cardiovascular: Normal rate, regular rhythm.   Good peripheral circulation. Respiratory: Normal respiratory effort.  No retractions. Gastrointestinal: Soft and nontender. No distention.  Genitourinary: deferred Musculoskeletal:  Warm and well perfused Neurologic:  Normal speech and language. No gross focal neurologic deficits are appreciated.  Skin:  Skin is warm, dry and intact. No rash noted. Psychiatric: Mood and affect are normal. Speech and behavior are normal.  ____________________________________________   LABS (all labs ordered are listed, but only abnormal results are displayed)  Labs Reviewed  POC URINE PREG, ED  POCT PREGNANCY, URINE   ____________________________________________  EKG  None ____________________________________________  RADIOLOGY  None ____________________________________________   PROCEDURES  Procedure(s) performed: No  Procedures   Critical Care performed: No ____________________________________________   INITIAL IMPRESSION / ASSESSMENT AND PLAN / ED COURSE  Pertinent labs & imaging results that were available during my care of the patient were reviewed by me and considered in my medical decision making (see chart for details).  Patient presents with what she reports as her typical headache/migraine symptoms.  We will treat with IV Toradol, Reglan, Benadryl, fluids and reevaluate   ----------------------------------------- 5:05 PM on 08/30/2017 -----------------------------------------  Patient had significant improvement after symptoms, will discharge home to rest, she knows she can return anytime      ____________________________________________   FINAL CLINICAL IMPRESSION(S) / ED DIAGNOSES  Final diagnoses:  Migraine without status migrainosus, not intractable, unspecified migraine type        Note:  This document was prepared using Dragon voice recognition software and may include unintentional dictation errors.    Lavonia Drafts, MD 08/30/17 1705

## 2017-08-30 NOTE — ED Triage Notes (Signed)
FN: per ems pt with headache x1week, hx of migraines, no meds. Denies any other meds. Pt moaning and groaning very loudly, beating her arm on the wheelchair.

## 2017-08-30 NOTE — ED Notes (Addendum)
Pt c/o headache since 2am, pt has hx of migraines, pt has no insurance and does not have any rescue meds for migraines at home took tylenol this morning  No relief.  Pt has nausea and photophobia

## 2017-08-31 ENCOUNTER — Other Ambulatory Visit: Payer: Self-pay

## 2017-08-31 ENCOUNTER — Encounter: Payer: Self-pay | Admitting: Emergency Medicine

## 2017-08-31 ENCOUNTER — Emergency Department: Payer: Self-pay

## 2017-08-31 ENCOUNTER — Emergency Department
Admission: EM | Admit: 2017-08-31 | Discharge: 2017-08-31 | Disposition: A | Discharge status: Home / Self Care | Payer: Self-pay | Attending: Emergency Medicine | Admitting: Emergency Medicine

## 2017-08-31 DIAGNOSIS — J45909 Unspecified asthma, uncomplicated: Secondary | ICD-10-CM | POA: Insufficient documentation

## 2017-08-31 DIAGNOSIS — S20212A Contusion of left front wall of thorax, initial encounter: Secondary | ICD-10-CM | POA: Insufficient documentation

## 2017-08-31 DIAGNOSIS — Y939 Activity, unspecified: Secondary | ICD-10-CM | POA: Insufficient documentation

## 2017-08-31 DIAGNOSIS — Y929 Unspecified place or not applicable: Secondary | ICD-10-CM | POA: Insufficient documentation

## 2017-08-31 DIAGNOSIS — F17210 Nicotine dependence, cigarettes, uncomplicated: Secondary | ICD-10-CM | POA: Insufficient documentation

## 2017-08-31 DIAGNOSIS — Y999 Unspecified external cause status: Secondary | ICD-10-CM | POA: Insufficient documentation

## 2017-08-31 MED ORDER — NAPROXEN 500 MG PO TABS: 500.0000 mg | ORAL_TABLET | Freq: Two times a day (BID) | ORAL | 0 refills | Status: DC

## 2017-08-31 MED ORDER — KETOROLAC TROMETHAMINE 30 MG/ML IJ SOLN
30.0000 mg | Freq: Once | INTRAMUSCULAR | Status: AC
  Administered 2017-08-31: 30 mg via INTRAMUSCULAR
  Filled 2017-08-31: qty 1
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
## 2017-08-31 NOTE — ED Notes (Signed)
Pt states left rib pain for almost a week, pt states the pain has been tolerable, but states after she left ED yesterday started noticing the pain.

## 2017-08-31 NOTE — ED Notes (Signed)
Pt in XR. 
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
## 2017-08-31 NOTE — Discharge Instructions (Signed)
Follow-up with your regular doctor at [facility] if any continued problems.  You may use ice to your ribs as needed for discomfort.  You were given a Toradol shot while in the emergency department.  Begin taking naproxen 500 mg twice daily with food tomorrow.  X-rays of your ribs did not show a fracture.

## 2017-09-25 ENCOUNTER — Emergency Department: Payer: Self-pay

## 2017-09-25 ENCOUNTER — Emergency Department
Admission: EM | Admit: 2017-09-25 | Discharge: 2017-09-25 | Disposition: A | Discharge status: Home / Self Care | Payer: Self-pay | Attending: Emergency Medicine | Admitting: Emergency Medicine

## 2017-09-25 ENCOUNTER — Encounter: Payer: Self-pay | Admitting: Emergency Medicine

## 2017-09-25 DIAGNOSIS — F17210 Nicotine dependence, cigarettes, uncomplicated: Secondary | ICD-10-CM | POA: Insufficient documentation

## 2017-09-25 DIAGNOSIS — F419 Anxiety disorder, unspecified: Secondary | ICD-10-CM | POA: Insufficient documentation

## 2017-09-25 DIAGNOSIS — J45909 Unspecified asthma, uncomplicated: Secondary | ICD-10-CM | POA: Insufficient documentation

## 2017-09-25 MED ORDER — ALBUTEROL SULFATE (2.5 MG/3ML) 0.083% IN NEBU
2.5000 mg | INHALATION_SOLUTION | Freq: Once | RESPIRATORY_TRACT | Status: AC
  Administered 2017-09-25: 2.5 mg via RESPIRATORY_TRACT
  Filled 2017-09-25: qty 3

## 2017-09-25 MED ORDER — HYDROXYZINE HCL 25 MG PO TABS
25.0000 mg | ORAL_TABLET | Freq: Once | ORAL | Status: AC
  Administered 2017-09-25: 25 mg via ORAL
  Filled 2017-09-25: qty 1

## 2017-09-25 MED ORDER — ALBUTEROL SULFATE HFA 108 (90 BASE) MCG/ACT IN AERS: 2.0000 | INHALATION_SPRAY | RESPIRATORY_TRACT | 1 refills | Status: DC | PRN

## 2017-09-25 MED ORDER — HYDROXYZINE HCL 25 MG PO TABS: 25.0000 mg | ORAL_TABLET | Freq: Three times a day (TID) | ORAL | 0 refills | Status: DC | PRN

## 2017-09-25 NOTE — ED Notes (Signed)
Pt states hx asthma and anxiety. Currently not taking any medications for either. States woke up this AM and felt like she couldn't breath. Pt congested. Pt states EMS brought her. Talking in complete sentences. No distress noted.

## 2017-09-25 NOTE — ED Triage Notes (Signed)
Pt in via EMS. EMS reports panic attacks, history of asthma. Pt anxious. 100%RA,

## 2017-09-25 NOTE — ED Provider Notes (Signed)
[facility] Emergency Department Provider Note  ____________________________________________  Time seen: Approximately 12:36 PM  I have reviewed the triage vital signs and the nursing notes.   HISTORY  Chief Complaint Shortness of Breath and Nasal Congestion   HPI Shirley Turner is a 27 y.o. female who presents to the emergency department for treatment and evaluation of asthma and anxiety.  She is not currently on any medications for either of the 2 although she has experienced both in the past.  Upon awakening this morning, she felt like she could not breathe and was very congested.  In the past, she has used an albuterol inhaler which has helped.  Past Medical History:  Diagnosis Date  . Anxiety   . Asthma     Patient Active Problem List   Diagnosis Date Noted  . History of depression 02/01/2016  . Postpartum care following vaginal delivery 01/30/2016    Past Surgical History:  Procedure Laterality Date  . TONSILLECTOMY AND ADENOIDECTOMY      Prior to Admission medications   Medication Sig Start Date End Date Taking? Authorizing Provider  albuterol (PROVENTIL HFA;VENTOLIN HFA) 108 (90 BASE) MCG/ACT inhaler Inhale 2 puffs into the lungs every 6 (six) hours as needed for wheezing or shortness of breath. Reported on 01/30/2016    [provider]  butalbital-acetaminophen-caffeine (FIORICET, ESGIC) 50-325-40 MG tablet Take 1-2 tablets by mouth every 6 (six) hours as needed for headache. 08/30/17 08/30/18  Lavonia Drafts, MD  naproxen (NAPROSYN) 500 MG tablet Take 1 tablet (500 mg total) by mouth 2 (two) times daily with a meal. 08/31/17 08/31/18  Johnn Hai, PA-C    Allergies Patient has no known allergies.  No family history on file.  Social History Social History   Tobacco Use  . Smoking status: Current Every Day Smoker    Packs/day: 1.00    Types: Cigarettes  . Smokeless tobacco: Never Used  Substance Use Topics  . Alcohol use: No   . Drug use: Yes    Types: Marijuana    Review of Systems Constitutional: Negative for fever/chills ENT: Negative sore throat. Cardiovascular: Denies chest pain. Respiratory: Positive for shortness of breath.  Positive for cough. Gastrointestinal: Negative nausea, no vomiting.  Negative for diarrhea.  Musculoskeletal: No for body aches Skin: No for rash. Neurological: Negative for headaches Psychological: Positive for anxiety ____________________________________________   PHYSICAL EXAM:  VITAL SIGNS: ED Triage Vitals [09/25/17 1036]  Enc Vitals Group     BP 120/89     Pulse Rate (!) 55     Resp 20     Temp 98.8 F (37.1 C)     Temp Source Oral     SpO2 99 %     Weight 170 lb (77.1 kg)     Height 5\' 2"  (1.575 m)     Head Circumference      Peak Flow      Pain Score      Pain Loc      Pain Edu?      Excl. in [place]?     Constitutional: Alert and oriented.  Well appearing and in no acute distress. Eyes: Conjunctivae are normal. EOMI. Ears: Bilateral tympanic membranes are normal Nose: No congestion noted; no rhinnorhea. Mouth/Throat: Mucous membranes are moist.  Oropharynx mildly erythematous. Tonsils 1+ without exudate. Neck: No stridor.  Lymphatic: No cervical lymphadenopathy. Cardiovascular: Normal rate, regular rhythm. Good peripheral circulation. Respiratory: Normal respiratory effort.  No retractions.  Scattered expiratory wheezes. Gastrointestinal: Soft  and nontender.  Musculoskeletal: FROM x 4 extremities.  Neurologic:  Normal speech and language.  Skin:  Skin is warm, dry and intact. No rash noted. Psychiatric: Mood and affect are normal. Speech and behavior are normal.  ____________________________________________   LABS (all labs ordered are listed, but only abnormal results are displayed)  Labs Reviewed - No data to display ____________________________________________  EKG  Not  indicated ____________________________________________  RADIOLOGY  Chest x-rays negative for acute cardiopulmonary abnormality per radiology ____________________________________________   PROCEDURES  Procedure(s) performed: None  Critical Care performed: No ____________________________________________   INITIAL IMPRESSION / ASSESSMENT AND PLAN / ED COURSE  27 y.o. female who presents to the emergency department for treatment and evaluation of anxiety and wheezing.  While here, she will be given a albuterol treatment and Atarax.  She will be discharged home with a prescription for a albuterol inhaler and Atarax to be taken 3 times a day as needed for anxiety. She was encouraged to return to the ER for symptoms that change or worsen if unable to schedule an appointment with primary care.  Medications  hydrOXYzine (ATARAX/VISTARIL) tablet 25 mg (not administered)  albuterol (PROVENTIL) (2.5 MG/3ML) 0.083% nebulizer solution 2.5 mg (not administered)    ED Discharge Orders    None       Pertinent labs & imaging results that were available during my care of the patient were reviewed by me and considered in my medical decision making (see chart for details).    If controlled substance prescribed during this visit, 12 month history viewed on the [place] prior to issuing an initial prescription for Schedule II or III opiod. ____________________________________________   FINAL CLINICAL IMPRESSION(S) / ED DIAGNOSES  Final diagnoses:  Anxiety  Mild asthma without complication, unspecified whether persistent    Note:  This document was prepared using Dragon voice recognition software and may include unintentional dictation errors.     Victorino Dike, FNP 09/25/17 1253    Shirley Mew, MD 09/25/17 [phone]

## 2017-09-25 NOTE — ED Notes (Signed)
Pt verbalized understanding of discharge instructions. NAD at this time. 

## 2017-09-25 NOTE — ED Triage Notes (Signed)
Pt reports had some trouble breathing this morning. States is just getting over the flu. Reports hx of asthma and states that her inhaler helps her but she does not have one right now.

## 2017-10-02 ENCOUNTER — Emergency Department
Admission: EM | Admit: 2017-10-02 | Discharge: 2017-10-03 | Disposition: A | Discharge status: Home / Self Care | Payer: Self-pay | Attending: Emergency Medicine | Admitting: Emergency Medicine

## 2017-10-02 ENCOUNTER — Other Ambulatory Visit: Payer: Self-pay

## 2017-10-02 DIAGNOSIS — Z79899 Other long term (current) drug therapy: Secondary | ICD-10-CM | POA: Insufficient documentation

## 2017-10-02 DIAGNOSIS — B9689 Other specified bacterial agents as the cause of diseases classified elsewhere: Secondary | ICD-10-CM | POA: Insufficient documentation

## 2017-10-02 DIAGNOSIS — J45909 Unspecified asthma, uncomplicated: Secondary | ICD-10-CM | POA: Insufficient documentation

## 2017-10-02 DIAGNOSIS — N760 Acute vaginitis: Secondary | ICD-10-CM | POA: Insufficient documentation

## 2017-10-02 DIAGNOSIS — F17210 Nicotine dependence, cigarettes, uncomplicated: Secondary | ICD-10-CM | POA: Insufficient documentation

## 2017-10-02 DIAGNOSIS — A549 Gonococcal infection, unspecified: Secondary | ICD-10-CM | POA: Insufficient documentation

## 2017-10-02 DIAGNOSIS — N730 Acute parametritis and pelvic cellulitis: Secondary | ICD-10-CM | POA: Insufficient documentation

## 2017-10-02 LAB — URINALYSIS, ROUTINE W REFLEX MICROSCOPIC
BACTERIA UA: NONE SEEN
Bilirubin Urine: NEGATIVE
GLUCOSE, UA: NEGATIVE mg/dL
Hgb urine dipstick: NEGATIVE
KETONES UR: 5 mg/dL — AB
Nitrite: NEGATIVE
PROTEIN: 30 mg/dL — AB
Specific Gravity, Urine: 1.023 (ref 1.005–1.030)
pH: 8 (ref 5.0–8.0)

## 2017-10-02 LAB — POCT PREGNANCY, URINE
PREG TEST UR: NEGATIVE
Preg Test, Ur: NEGATIVE

## 2017-10-02 LAB — WET PREP, GENITAL
SPERM: NONE SEEN
TRICH WET PREP: NONE SEEN
YEAST WET PREP: NONE SEEN

## 2017-10-02 MED ORDER — ACETAMINOPHEN-CODEINE #3 300-30 MG PO TABS: 1.0000 | ORAL_TABLET | Freq: Four times a day (QID) | ORAL | 0 refills | Status: DC | PRN

## 2017-10-02 MED ORDER — METRONIDAZOLE 500 MG PO TABS
500.0000 mg | ORAL_TABLET | Freq: Two times a day (BID) | ORAL | 0 refills | Status: AC
Start: 2017-10-02 — End: 2017-10-09

## 2017-10-02 MED ORDER — ONDANSETRON HCL 4 MG/2ML IJ SOLN: 4.0000 mg | Freq: Once | INTRAMUSCULAR | Status: DC

## 2017-10-02 MED ORDER — SODIUM CHLORIDE 0.9 % IV BOLUS (SEPSIS): 1000.0000 mL | Freq: Once | INTRAVENOUS | Status: DC

## 2017-10-02 MED ORDER — CEFTRIAXONE SODIUM 250 MG IJ SOLR
250.0000 mg | Freq: Once | INTRAMUSCULAR | Status: AC
  Administered 2017-10-02: 250 mg via INTRAMUSCULAR
  Filled 2017-10-02: qty 250

## 2017-10-02 MED ORDER — METRONIDAZOLE 500 MG PO TABS
500.0000 mg | ORAL_TABLET | Freq: Once | ORAL | Status: AC
  Administered 2017-10-02: 500 mg via ORAL
  Filled 2017-10-02: qty 1

## 2017-10-02 MED ORDER — KETOROLAC TROMETHAMINE 30 MG/ML IJ SOLN: 30.0000 mg | Freq: Once | INTRAMUSCULAR | Status: DC

## 2017-10-02 MED ORDER — ONDANSETRON 4 MG PO TBDP
4.0000 mg | ORAL_TABLET | Freq: Once | ORAL | Status: AC
  Administered 2017-10-02: 4 mg via ORAL
  Filled 2017-10-02: qty 1

## 2017-10-02 MED ORDER — ONDANSETRON 4 MG PO TBDP: 4.0000 mg | ORAL_TABLET | Freq: Three times a day (TID) | ORAL | 0 refills | Status: DC | PRN

## 2017-10-02 MED ORDER — AZITHROMYCIN 500 MG PO TABS
1000.0000 mg | ORAL_TABLET | Freq: Once | ORAL | Status: AC
  Administered 2017-10-02: 1000 mg via ORAL
  Filled 2017-10-02: qty 2

## 2017-10-02 NOTE — ED Triage Notes (Addendum)
Pt arrives to ED via POV from home with c/o pelvic/vaginal pain x2 days. Pt reports similar s/x's in the past and was d/x'd and treated for STD. Pt reports clear/greenish vaginal d/c. Pt reports decrease in urinary frequency r/t pain and burning with urination.
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
## 2017-10-02 NOTE — ED Notes (Signed)
Pt c/o pelvic and vaginal pain x2days. Pt reports green discharge and being treated for STD in the past with similar symptoms.

## 2017-10-03 LAB — CHLAMYDIA/NGC RT PCR (ARMC ONLY)
CHLAMYDIA TR: NOT DETECTED
N gonorrhoeae: DETECTED — AB

## 2017-10-03 NOTE — ED Provider Notes (Signed)
[facility] Emergency Department Provider Note ____________________________________________  Time seen: 2129  I have reviewed the triage vital signs and the nursing notes.  HISTORY  Chief Complaint  Vaginal Pain  HPI Shirley Turner is a 27 y.o. female resents to the ED for evaluation of pelvic pain and vaginal discomfort the last 2 days.  Patient describes similar symptoms in the past when she was diagnosed with an STD and PID.  She reports a greenish vaginal discharge over the last day or so.  Is also had increased urinary frequency as well as pelvic discomfort.  She denies any fevers, chills, sweats but she does note some nausea and some hot flashes.  Past Medical History:  Diagnosis Date  . Anxiety   . Asthma     Patient Active Problem List   Diagnosis Date Noted  . History of depression 02/01/2016  . Postpartum care following vaginal delivery 01/30/2016    Past Surgical History:  Procedure Laterality Date  . TONSILLECTOMY AND ADENOIDECTOMY      Prior to Admission medications   Medication Sig Start Date End Date Taking? Authorizing Provider  acetaminophen-codeine (TYLENOL #3) 300-30 MG tablet Take 1 tablet by mouth every 6 (six) hours as needed for moderate pain. 10/02/17   Leslie Jester, Dannielle Karvonen, PA-C  albuterol (PROVENTIL HFA;VENTOLIN HFA) 108 (90 Base) MCG/ACT inhaler Inhale 2 puffs into the lungs every 4 (four) hours as needed for wheezing or shortness of breath. 09/25/17   Triplett, Cari B, FNP  butalbital-acetaminophen-caffeine (FIORICET, ESGIC) 50-325-40 MG tablet Take 1-2 tablets by mouth every 6 (six) hours as needed for headache. 08/30/17 08/30/18  Lavonia Drafts, MD  hydrOXYzine (ATARAX/VISTARIL) 25 MG tablet Take 1 tablet (25 mg total) by mouth 3 (three) times daily as needed. 09/25/17   Triplett, Johnette Abraham B, FNP  metroNIDAZOLE (FLAGYL) 500 MG tablet Take 1 tablet (500 mg total) by mouth 2 (two) times daily for 14 doses. 10/02/17 10/09/17  Kesi Perrow, Dannielle Karvonen, PA-C  naproxen (NAPROSYN) 500 MG tablet Take 1 tablet (500 mg total) by mouth 2 (two) times daily with a meal. 08/31/17 08/31/18  Letitia Neri L, PA-C  ondansetron (ZOFRAN ODT) 4 MG disintegrating tablet Take 1 tablet (4 mg total) by mouth every 8 (eight) hours as needed. 10/02/17   Maylie Ashton, Dannielle Karvonen, PA-C    Allergies Patient has no known allergies.  No family history on file.  Social History Social History   Tobacco Use  . Smoking status: Current Every Day Smoker    Packs/day: 1.00    Types: Cigarettes  . Smokeless tobacco: Never Used  Substance Use Topics  . Alcohol use: No  . Drug use: Yes    Types: Marijuana    Review of Systems  Constitutional: Negative for fever. Cardiovascular: Negative for chest pain. Respiratory: Negative for shortness of breath. Gastrointestinal: Negative for abdominal pain, vomiting and diarrhea. Genitourinary: Positive for dysuria. Reports pelvic pain and vaginal discharge. Musculoskeletal: Negative for back pain. Skin: Negative for rash. Neurological: Negative for headaches, focal weakness or numbness. ____________________________________________  PHYSICAL EXAM:  VITAL SIGNS: ED Triage Vitals  Enc Vitals Group     BP 10/02/17 2101 124/76     Pulse Rate 10/02/17 2101 92     Resp 10/02/17 2101 18     Temp 10/02/17 2101 99.2 F (37.3 C)     Temp Source 10/02/17 2101 Oral     SpO2 10/02/17 2101 100 %     Weight 10/02/17 2059 170 lb (  77.1 kg)     Height 10/02/17 2059 5\' 3"  (1.6 m)     Head Circumference --      Peak Flow --      Pain Score 10/02/17 2059 10     Pain Loc --      Pain Edu? --      Excl. in [place]? --    Constitutional: Alert and oriented. Well appearing and in no distress. Head: Normocephalic and atraumatic. Eyes: Conjunctivae are normal. Normal extraocular movements Cardiovascular: Normal rate, regular rhythm. Normal distal pulses. Respiratory: Normal respiratory effort. No wheezes/rales/rhonchi. GU:  Normal external genitalia.  Patient with a purulent, creamy, greenish discharge noted in the vagina.  She has positive cervical motion tenderness.  No adnexal masses are appreciated. Musculoskeletal: Nontender with normal range of motion in all extremities.  Neurologic:  Normal gait without ataxia. Normal speech and language. No gross focal neurologic deficits are appreciated. Skin:  Skin is warm, dry and intact. No rash noted. Psychiatric: Mood and affect are normal. Patient exhibits appropriate insight and judgment. ____________________________________________   LABS (pertinent positives/negatives)  Labs Reviewed  WET PREP, GENITAL - Abnormal; Notable for the following components:      Result Value   Clue Cells Wet Prep HPF POC PRESENT (*)    WBC, Wet Prep HPF POC MODERATE (*)    All other components within normal limits  URINALYSIS, ROUTINE W REFLEX MICROSCOPIC - Abnormal; Notable for the following components:   Color, Urine YELLOW (*)    APPearance HAZY (*)    Ketones, ur 5 (*)    Protein, ur 30 (*)    Leukocytes, UA LARGE (*)    Squamous Epithelial / LPF 6-30 (*)    All other components within normal limits  CHLAMYDIA/NGC RT PCR ([facility] ONLY)  POC URINE PREG, ED  POCT PREGNANCY, URINE  POCT PREGNANCY, URINE  ____________________________________________  PROCEDURES  Procedures Azithromycin 1 g PO Rocephin 250 mg PO Zofran 4 mg ODT Flagyl 500 mg PO ____________________________________________  INITIAL IMPRESSION / ASSESSMENT AND PLAN / ED COURSE  Patient with ED evaluation of pelvic pain, dysuria, and purulent discharge.  She has a clinical picture consistent with pelvic inflammatory disease likely due to gonorrhea.  She also has a wet prep that confirms BV, at the time of discharge.  Patient is treated empirically for gonorrhea and chlamydia.  Patient also experienced an episode of anxiety while in the ED.  She was treated with nasal O2.  Her symptoms resolved  without further intervention.  She is advised to follow with primary provider or return to the ED as needed. ____________________________________________  FINAL CLINICAL IMPRESSION(S) / ED DIAGNOSES  Final diagnoses:  PID (acute pelvic inflammatory disease)  Gonorrhea  BV (bacterial vaginosis)      Carmie End, Dannielle Karvonen, PA-C 10/03/17 0013    Orbie Pyo, MD 10/03/17 0040

## 2018-03-09 ENCOUNTER — Emergency Department
Admission: EM | Admit: 2018-03-09 | Discharge: 2018-03-09 | Disposition: A | Discharge status: Home / Self Care | Payer: Self-pay | Attending: Emergency Medicine | Admitting: Emergency Medicine

## 2018-03-09 ENCOUNTER — Other Ambulatory Visit: Payer: Self-pay

## 2018-03-09 DIAGNOSIS — J45909 Unspecified asthma, uncomplicated: Secondary | ICD-10-CM | POA: Insufficient documentation

## 2018-03-09 DIAGNOSIS — F1210 Cannabis abuse, uncomplicated: Secondary | ICD-10-CM | POA: Insufficient documentation

## 2018-03-09 DIAGNOSIS — R5383 Other fatigue: Secondary | ICD-10-CM | POA: Insufficient documentation

## 2018-03-09 DIAGNOSIS — F17210 Nicotine dependence, cigarettes, uncomplicated: Secondary | ICD-10-CM | POA: Insufficient documentation

## 2018-03-09 DIAGNOSIS — F410 Panic disorder [episodic paroxysmal anxiety] without agoraphobia: Secondary | ICD-10-CM | POA: Insufficient documentation

## 2018-03-09 DIAGNOSIS — R110 Nausea: Secondary | ICD-10-CM | POA: Insufficient documentation

## 2018-03-09 DIAGNOSIS — R51 Headache: Secondary | ICD-10-CM | POA: Insufficient documentation

## 2018-03-09 DIAGNOSIS — F10120 Alcohol abuse with intoxication, uncomplicated: Secondary | ICD-10-CM | POA: Insufficient documentation

## 2018-03-09 LAB — URINALYSIS, COMPLETE (UACMP) WITH MICROSCOPIC
BACTERIA UA: NONE SEEN
BILIRUBIN URINE: NEGATIVE
Glucose, UA: NEGATIVE mg/dL
KETONES UR: 20 mg/dL — AB
Leukocytes, UA: NEGATIVE
Nitrite: NEGATIVE
PROTEIN: 30 mg/dL — AB
Specific Gravity, Urine: 1.027 (ref 1.005–1.030)
pH: 6 (ref 5.0–8.0)

## 2018-03-09 LAB — POCT PREGNANCY, URINE: Preg Test, Ur: NEGATIVE

## 2018-03-09 MED ORDER — IBUPROFEN 600 MG PO TABS
600.0000 mg | ORAL_TABLET | ORAL | Status: AC
  Administered 2018-03-09: 600 mg via ORAL
  Filled 2018-03-09: qty 1

## 2018-03-09 MED ORDER — HYDROXYZINE HCL 25 MG PO TABS: 25.0000 mg | ORAL_TABLET | Freq: Three times a day (TID) | ORAL | 0 refills | Status: DC | PRN

## 2018-03-09 MED ORDER — ONDANSETRON 4 MG PO TBDP
4.0000 mg | ORAL_TABLET | Freq: Once | ORAL | Status: AC
  Administered 2018-03-09: 4 mg via ORAL
  Filled 2018-03-09: qty 1

## 2018-03-09 NOTE — ED Triage Notes (Signed)
Pt comes into the ED via EMS from home with c/o increased headaches and panic attacks over the past couple of weeks. Pt states she was prescribed medication for it but doesn't like the way it makes her feel. When asked who prescribes her meds she said she gets them here. States she has only been to [place] once.Marland Kitchen

## 2018-03-09 NOTE — ED Notes (Signed)
Pt discharged home with sister. VS stable. Pt denies SI/HI and AVH. Discharge instructions and prescription reviewed with patient.

## 2018-03-09 NOTE — ED Notes (Signed)
Amy B. RN aware of placement in [facility].
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
## 2018-03-09 NOTE — ED Notes (Signed)
Patient to [place], when asked if she was okay to be in [place], patient states no, so I remained in [facility] with her.  To [facility] via WC.

## 2018-03-09 NOTE — ED Provider Notes (Signed)
[facility] Emergency Department Provider Note   ____________________________________________   First MD Initiated Contact with Patient 03/09/18 [phone]     (approximate)  I have reviewed the triage vital signs and the nursing notes.   HISTORY  Chief Complaint Anxiety and Migraine    HPI Shirley Turner is a 27 y.o. female here for evaluation of anxiety  Patient reports that for about the last week she is been under a lot of stress.  She reports that her work environment at [employer] is been stressful.  She reports she has to work but does not really want to go to work.  Denies any ones trying to harm her, just reports that the stressful environment.  She does not want to hurt herself or anyone else.  She has a history of anxiety and she reports that she is been seen in the ER for it and was given a prescription but she does not think she feels it may be 6 months to a year ago.  She gets off-and-on panic attacks.  She also reports that this morning she felt slightly headache and nauseated a little bit fatigued, but also reports they went out and celebrate her sister's birthday last night and that she drank alcohol then and feels that might be the cause.  No desire to hurt herself.  No hallucinations.  Denies feeling paranoid.  Not suicidal.  Not wanting to harm herself.  Reports she just feels like she needs something to help her get her anxiety under control.  Is been to [facility] once before but not really followed up with them.  Denies pregnancy.  Past Medical History:  Diagnosis Date  . Anxiety   . Asthma     Patient Active Problem List   Diagnosis Date Noted  . History of depression 02/01/2016  . Postpartum care following vaginal delivery 01/30/2016    Past Surgical History:  Procedure Laterality Date  . TONSILLECTOMY AND ADENOIDECTOMY      Prior to Admission medications   Medication Sig Start Date End Date Taking? Authorizing Provider    acetaminophen-codeine (TYLENOL #3) 300-30 MG tablet Take 1 tablet by mouth every 6 (six) hours as needed for moderate pain. 10/02/17   Menshew, Dannielle Karvonen, PA-C  albuterol (PROVENTIL HFA;VENTOLIN HFA) 108 (90 Base) MCG/ACT inhaler Inhale 2 puffs into the lungs every 4 (four) hours as needed for wheezing or shortness of breath. 09/25/17   Triplett, Cari B, FNP  butalbital-acetaminophen-caffeine (FIORICET, ESGIC) 50-325-40 MG tablet Take 1-2 tablets by mouth every 6 (six) hours as needed for headache. 08/30/17 08/30/18  Lavonia Drafts, MD  hydrOXYzine (ATARAX/VISTARIL) 25 MG tablet Take 1 tablet (25 mg total) by mouth 3 (three) times daily as needed for anxiety. 03/09/18   Delman Kitten, MD  naproxen (NAPROSYN) 500 MG tablet Take 1 tablet (500 mg total) by mouth 2 (two) times daily with a meal. 08/31/17 08/31/18  Letitia Neri L, PA-C  ondansetron (ZOFRAN ODT) 4 MG disintegrating tablet Take 1 tablet (4 mg total) by mouth every 8 (eight) hours as needed. 10/02/17   Menshew, Dannielle Karvonen, PA-C    Allergies Patient has no known allergies.  No family history on file.  Social History Social History   Tobacco Use  . Smoking status: Current Every Day Smoker    Packs/day: 1.00    Types: Cigarettes  . Smokeless tobacco: Never Used  Substance Use Topics  . Alcohol use: No  . Drug use: Yes  Types: Marijuana    Review of Systems Constitutional: No fever/chills feels just slightly fatigued. Eyes: No visual changes. ENT: No sore throat. Cardiovascular: Denies chest pain. Respiratory: Denies shortness of breath. Gastrointestinal: No abdominal pain.  No vomiting.  No diarrhea.  No constipation.  Is just a little bit nauseated. Genitourinary: Negative for dysuria. Musculoskeletal: Negative for back pain. Skin: Negative for rash. Neurological: Negative  focal weakness or numbness.  Mild headache.    ____________________________________________   PHYSICAL EXAM:  VITAL SIGNS: ED Triage Vitals  [03/09/18 0741]  Enc Vitals Group     BP 120/75     Pulse Rate 60     Resp 16     Temp 98.4 F (36.9 C)     Temp Source Oral     SpO2 100 %     Weight 167 lb (75.8 kg)     Height 5\' 2"  (1.575 m)     Head Circumference      Peak Flow      Pain Score 9     Pain Loc      Pain Edu?      Excl. in [place]?     Constitutional: Alert and oriented. Well appearing and in no acute distress.  She is actually resting quite comfortably, laying on her side with blankets on.  She arouses to voice and is very pleasant.  Reports she is feeling much better now already, but she is been having these "panic attacks" off and on for about a week. Eyes: Conjunctivae are normal. Head: Atraumatic. Nose: No congestion/rhinnorhea. Mouth/Throat: Mucous membranes are moist. Neck: No stridor.   Cardiovascular: Normal rate, regular rhythm. Grossly normal heart sounds.  Good peripheral circulation. Respiratory: Normal respiratory effort.  No retractions. Lungs CTAB. Gastrointestinal: Soft and nontender. No distention. Musculoskeletal: No lower extremity tenderness nor edema. Neurologic:  Normal speech and language. No gross focal neurologic deficits are appreciated.  Skin:  Skin is warm, dry and intact. No rash noted. Psychiatric: Mood and affect are normal. Speech and behavior are normal.  ____________________________________________   LABS (all labs ordered are listed, but only abnormal results are displayed)  Labs Reviewed  URINALYSIS, COMPLETE (UACMP) WITH MICROSCOPIC - Abnormal; Notable for the following components:      Result Value   Color, Urine YELLOW (*)    APPearance CLOUDY (*)    Hgb urine dipstick SMALL (*)    Ketones, ur 20 (*)    Protein, ur 30 (*)    All other components within normal limits  POCT PREGNANCY, URINE    ____________________________________________  EKG   ____________________________________________  RADIOLOGY   ____________________________________________   PROCEDURES  Procedure(s) performed: None  Procedures  Critical Care performed: No  ____________________________________________   INITIAL IMPRESSION / ASSESSMENT AND PLAN / ED COURSE  Pertinent labs & imaging results that were available during my care of the patient were reviewed by me and considered in my medical decision making (see chart for details).  Patient returns for evaluation of panic-like attacks.  Reports under a recent stress regarding work situation.  No evidence of acute psychiatric concern to suggest a need for involuntary commitment.  She is pleasant and resting comfortably at the time of my evaluation.  Denies acute psychiatric symptoms other than anxiety.  Currently does not appear anxious.  Does report a slight headache and fatigue but also reports that she thinks is in the setting of drinking fairly heavily last night.  She is not a daily drinker.  Not appear to  be intoxicated by exam  Vitals:   03/09/18 0741  BP: 120/75  Pulse: 60  Resp: 16  Temp: 98.4 F (36.9 C)  SpO2: 100%    Nontoxic.  Well-appearing.  Normal vital signs.  Will discharge patient to home with prescription for Atarax, follow-up with primary at [facility] strongly encouraged.  Patient agreeable.  Patient also agreeable not to drive while taking Atarax or within 8 hours of use.  Return precautions and treatment recommendations and follow-up discussed with the patient who is agreeable with the plan.       ____________________________________________   FINAL CLINICAL IMPRESSION(S) / ED DIAGNOSES  Final diagnoses:  Hangover effect, uncomplicated ([place])  Anxiety attack      NEW MEDICATIONS STARTED DURING THIS VISIT:  Current Discharge Medication List       Note:  This document was prepared using Dragon  voice recognition software and may include unintentional dictation errors.     Delman Kitten, MD 03/09/18 [phone]

## 2018-03-20 ENCOUNTER — Emergency Department
Admission: EM | Admit: 2018-03-20 | Discharge: 2018-03-20 | Disposition: A | Discharge status: Home / Self Care | Payer: Self-pay | Attending: Emergency Medicine | Admitting: Emergency Medicine

## 2018-03-20 ENCOUNTER — Other Ambulatory Visit: Payer: Self-pay

## 2018-03-20 ENCOUNTER — Encounter: Payer: Self-pay | Admitting: *Deleted

## 2018-03-20 DIAGNOSIS — F17210 Nicotine dependence, cigarettes, uncomplicated: Secondary | ICD-10-CM | POA: Insufficient documentation

## 2018-03-20 DIAGNOSIS — J45909 Unspecified asthma, uncomplicated: Secondary | ICD-10-CM | POA: Insufficient documentation

## 2018-03-20 DIAGNOSIS — B070 Plantar wart: Secondary | ICD-10-CM | POA: Insufficient documentation

## 2018-03-20 DIAGNOSIS — Z79899 Other long term (current) drug therapy: Secondary | ICD-10-CM | POA: Insufficient documentation

## 2018-03-20 MED ORDER — MUPIROCIN 2 % EX OINT: 1.0000 "application " | TOPICAL_OINTMENT | Freq: Two times a day (BID) | CUTANEOUS | 0 refills | Status: DC

## 2018-03-20 NOTE — ED Triage Notes (Signed)
Pt has pain in both feet.  Pt states she has a callous to bottom of right foot and cant walk well.  Sx for 2 years.

## 2018-03-20 NOTE — Discharge Instructions (Addendum)
Apply over-the-counter wart on a stick.  You can apply these to the lesions and then wrapped a bandage over top of it.  This will help the warts go away on their own.  If you are not having any success with this she should follow-up with podiatry.  A phone numbers been provided for you.

## 2018-03-20 NOTE — ED Provider Notes (Signed)
[facility] Emergency Department Provider Note  ____________________________________________   First MD Initiated Contact with Patient 03/20/18 1528     (approximate)  I have reviewed the triage vital signs and the nursing notes.   HISTORY  Chief Complaint Foot Pain    HPI Shirley Turner is a 27 y.o. female resents emergency department complaining of bilateral foot pain.  States she has some sores on the bottom of her feet.  She states she scraped 1 and pulled the core out yesterday.  States areas continue to bleed and she is worried about infection.  She states it still hurts to stand on both feet.  She is unsure what the sore is.  She states her sister told her she thought it was a plantars wart.    Past Medical History:  Diagnosis Date  . Anxiety   . Asthma     Patient Active Problem List   Diagnosis Date Noted  . History of depression 02/01/2016  . Postpartum care following vaginal delivery 01/30/2016    Past Surgical History:  Procedure Laterality Date  . TONSILLECTOMY AND ADENOIDECTOMY      Prior to Admission medications   Medication Sig Start Date End Date Taking? Authorizing Provider  acetaminophen-codeine (TYLENOL #3) 300-30 MG tablet Take 1 tablet by mouth every 6 (six) hours as needed for moderate pain. 10/02/17   Menshew, Dannielle Karvonen, PA-C  albuterol (PROVENTIL HFA;VENTOLIN HFA) 108 (90 Base) MCG/ACT inhaler Inhale 2 puffs into the lungs every 4 (four) hours as needed for wheezing or shortness of breath. 09/25/17   Triplett, Cari B, FNP  butalbital-acetaminophen-caffeine (FIORICET, ESGIC) 50-325-40 MG tablet Take 1-2 tablets by mouth every 6 (six) hours as needed for headache. 08/30/17 08/30/18  Lavonia Drafts, MD  hydrOXYzine (ATARAX/VISTARIL) 25 MG tablet Take 1 tablet (25 mg total) by mouth 3 (three) times daily as needed for anxiety. 03/09/18   Delman Kitten, MD  mupirocin ointment (BACTROBAN) 2 % Apply 1 application topically 2 (two)  times daily. 03/20/18   Caryn Section Linden Dolin, PA-C  naproxen (NAPROSYN) 500 MG tablet Take 1 tablet (500 mg total) by mouth 2 (two) times daily with a meal. 08/31/17 08/31/18  Letitia Neri L, PA-C  ondansetron (ZOFRAN ODT) 4 MG disintegrating tablet Take 1 tablet (4 mg total) by mouth every 8 (eight) hours as needed. 10/02/17   Menshew, Dannielle Karvonen, PA-C    Allergies Patient has no known allergies.  No family history on file.  Social History Social History   Tobacco Use  . Smoking status: Current Every Day Smoker    Packs/day: 1.00    Types: Cigarettes  . Smokeless tobacco: Never Used  Substance Use Topics  . Alcohol use: No  . Drug use: Yes    Types: Marijuana    Review of Systems  Constitutional: No fever/chills Eyes: No visual changes. ENT: No sore throat. Respiratory: Denies cough Genitourinary: Negative for dysuria. Musculoskeletal: Negative for back pain.  Bilateral foot pain Skin: Negative for rash.    ____________________________________________   PHYSICAL EXAM:  VITAL SIGNS: ED Triage Vitals  Enc Vitals Group     BP 03/20/18 1513 113/69     Pulse Rate 03/20/18 1513 80     Resp 03/20/18 1513 18     Temp 03/20/18 1513 98.5 F (36.9 C)     Temp Source 03/20/18 1513 Oral     SpO2 03/20/18 1513 99 %     Weight 03/20/18 1514 165 lb (74.8 kg)  Height 03/20/18 1514 5\' 1"  (1.549 m)     Head Circumference --      Peak Flow --      Pain Score 03/20/18 1514 10     Pain Loc --      Pain Edu? --      Excl. in [place]? --     Constitutional: Alert and oriented. Well appearing and in no acute distress. Eyes: Conjunctivae are normal.  Head: Atraumatic. Nose: No congestion/rhinnorhea. Mouth/Throat: Mucous membranes are moist.   Neck:  supple no lymphadenopathy noted Cardiovascular: Normal rate, regular rhythm.  Respiratory: Normal respiratory effort.  No retractions  GU: deferred Musculoskeletal: FROM all extremities, warm and well perfused, no bony tenderness  noted on both feet. Neurologic:  Normal speech and language.  Skin:  Skin is warm, dry.  Positive for an open sore on the plantar surface of the left foot.  Patient has several plantars warts and calluses noted. Psychiatric: Mood and affect are normal. Speech and behavior are normal.  ____________________________________________   LABS (all labs ordered are listed, but only abnormal results are displayed)  Labs Reviewed - No data to display ____________________________________________   ____________________________________________  RADIOLOGY    ____________________________________________   PROCEDURES  Procedure(s) performed: No  Procedures    ____________________________________________   INITIAL IMPRESSION / ASSESSMENT AND PLAN / ED COURSE  Pertinent labs & imaging results that were available during my care of the patient were reviewed by me and considered in my medical decision making (see chart for details).   Patient is 27 year old female presents emergency department complaining of bilateral foot pain.  States the right foot hurts but she also dog out a plantars wart from the use bottom of the left foot.  She denies any other injuries.  She denies any fever, chills, numbness or tingling.  On physical exam there are several plantars warts noted on the bottoms of both soles.  On the left foot the area is slightly bleeding where she had been taking in at the foot.  No foreign body is noted.  No bony tenderness noted.  Neurovascular is intact  Explained findings to the patient.  She was instructed to buy wart on a stick as this will help remove the warts.  She can also follow-up with podiatry.  She was given a prescription for Bactroban ointment to apply to the open sore on the bottom of the foot.  She was given a work note for today and tomorrow.  She is to soak the feet in warm water with Epson salts.  Return to emergency department worsening.  She states she understands  was discharged in stable condition     As part of my medical decision making, I reviewed the following data within the [facility] notes reviewed and incorporated, Notes from prior ED visits and [place] Controlled Substance Database  ____________________________________________   FINAL CLINICAL IMPRESSION(S) / ED DIAGNOSES  Final diagnoses:  Plantar wart of both feet      NEW MEDICATIONS STARTED DURING THIS VISIT:  Discharge Medication List as of 03/20/2018  3:37 PM    START taking these medications   Details  mupirocin ointment (BACTROBAN) 2 % Apply 1 application topically 2 (two) times daily., Starting Thu 03/20/2018, Normal         Note:  This document was prepared using Dragon voice recognition software and may include unintentional dictation errors.    Versie Starks, PA-C 03/20/18 1659    Eula Listen, MD 03/20/18  2326  

## 2018-06-05 ENCOUNTER — Encounter: Payer: Self-pay | Admitting: Emergency Medicine

## 2018-06-05 ENCOUNTER — Other Ambulatory Visit: Payer: Self-pay

## 2018-06-05 ENCOUNTER — Emergency Department: Payer: Self-pay

## 2018-06-05 ENCOUNTER — Emergency Department
Admission: EM | Admit: 2018-06-05 | Discharge: 2018-06-05 | Disposition: A | Discharge status: Home / Self Care | Payer: Self-pay | Attending: Student in an Organized Health Care Education/Training Program | Admitting: Student in an Organized Health Care Education/Training Program

## 2018-06-05 DIAGNOSIS — F17210 Nicotine dependence, cigarettes, uncomplicated: Secondary | ICD-10-CM | POA: Insufficient documentation

## 2018-06-05 DIAGNOSIS — Z79899 Other long term (current) drug therapy: Secondary | ICD-10-CM | POA: Insufficient documentation

## 2018-06-05 DIAGNOSIS — J45909 Unspecified asthma, uncomplicated: Secondary | ICD-10-CM | POA: Insufficient documentation

## 2018-06-05 DIAGNOSIS — F329 Major depressive disorder, single episode, unspecified: Secondary | ICD-10-CM | POA: Insufficient documentation

## 2018-06-05 DIAGNOSIS — L089 Local infection of the skin and subcutaneous tissue, unspecified: Secondary | ICD-10-CM | POA: Insufficient documentation

## 2018-06-05 DIAGNOSIS — L84 Corns and callosities: Secondary | ICD-10-CM | POA: Insufficient documentation

## 2018-06-05 DIAGNOSIS — F419 Anxiety disorder, unspecified: Secondary | ICD-10-CM | POA: Insufficient documentation

## 2018-06-05 MED ORDER — CIPROFLOXACIN HCL 500 MG PO TABS: 500.0000 mg | ORAL_TABLET | Freq: Two times a day (BID) | ORAL | 0 refills | Status: AC

## 2018-06-05 MED ORDER — LIDOCAINE-EPINEPHRINE-TETRACAINE (LET) SOLUTION
3.0000 mL | Freq: Once | NASAL | Status: DC
  Filled 2018-06-05: qty 3

## 2018-06-05 MED ORDER — CLINDAMYCIN HCL 150 MG PO CAPS
300.0000 mg | ORAL_CAPSULE | Freq: Once | ORAL | Status: AC
  Administered 2018-06-05: 300 mg via ORAL
  Filled 2018-06-05: qty 2

## 2018-06-05 MED ORDER — CLINDAMYCIN HCL 300 MG PO CAPS: 300.0000 mg | ORAL_CAPSULE | Freq: Three times a day (TID) | ORAL | 0 refills | Status: DC

## 2018-06-05 MED ORDER — CIPROFLOXACIN HCL 500 MG PO TABS
500.0000 mg | ORAL_TABLET | Freq: Once | ORAL | Status: AC
  Administered 2018-06-05: 500 mg via ORAL
  Filled 2018-06-05: qty 1

## 2018-06-05 NOTE — ED Provider Notes (Signed)
[facility] Emergency Department Provider Note  ____________________________________________  Time seen: Approximately 5:19 PM  I have reviewed the triage vital signs and the nursing notes.   HISTORY  Chief Complaint Foot Pain    HPI Shirley Turner is a 27 y.o. female that presents emergency department for evaluation of wound to the bottom of left foot.  Area started out as a plantar wart but became more painful last night.  It started draining yellow.  It is still been draining this morning.  She has not taken anything for pain.  No fever, chills.  Past Medical History:  Diagnosis Date  . Anxiety   . Asthma     Patient Active Problem List   Diagnosis Date Noted  . History of depression 02/01/2016  . Postpartum care following vaginal delivery 01/30/2016    Past Surgical History:  Procedure Laterality Date  . TONSILLECTOMY AND ADENOIDECTOMY      Prior to Admission medications   Medication Sig Start Date End Date Taking? Authorizing Provider  acetaminophen-codeine (TYLENOL #3) 300-30 MG tablet Take 1 tablet by mouth every 6 (six) hours as needed for moderate pain. 10/02/17   Menshew, Dannielle Karvonen, PA-C  albuterol (PROVENTIL HFA;VENTOLIN HFA) 108 (90 Base) MCG/ACT inhaler Inhale 2 puffs into the lungs every 4 (four) hours as needed for wheezing or shortness of breath. 09/25/17   Triplett, Cari B, FNP  butalbital-acetaminophen-caffeine (FIORICET, ESGIC) 50-325-40 MG tablet Take 1-2 tablets by mouth every 6 (six) hours as needed for headache. 08/30/17 08/30/18  Lavonia Drafts, MD  ciprofloxacin (CIPRO) 500 MG tablet Take 1 tablet (500 mg total) by mouth 2 (two) times daily for 7 days. 06/05/18 06/12/18  Laban Emperor, PA-C  clindamycin (CLEOCIN) 300 MG capsule Take 1 capsule (300 mg total) by mouth 3 (three) times daily for 10 days. 06/05/18 06/15/18  Laban Emperor, PA-C  hydrOXYzine (ATARAX/VISTARIL) 25 MG tablet Take 1 tablet (25 mg total) by mouth 3  (three) times daily as needed for anxiety. 03/09/18   Delman Kitten, MD  mupirocin ointment (BACTROBAN) 2 % Apply 1 application topically 2 (two) times daily. 03/20/18   Caryn Section Linden Dolin, PA-C  naproxen (NAPROSYN) 500 MG tablet Take 1 tablet (500 mg total) by mouth 2 (two) times daily with a meal. 08/31/17 08/31/18  Letitia Neri L, PA-C  ondansetron (ZOFRAN ODT) 4 MG disintegrating tablet Take 1 tablet (4 mg total) by mouth every 8 (eight) hours as needed. 10/02/17   Menshew, Dannielle Karvonen, PA-C    Allergies Patient has no known allergies.  History reviewed. No pertinent family history.  Social History Social History   Tobacco Use  . Smoking status: Current Every Day Smoker    Packs/day: 1.00    Types: Cigarettes  . Smokeless tobacco: Never Used  Substance Use Topics  . Alcohol use: No  . Drug use: Yes    Types: Marijuana     Review of Systems  Respiratory: No SOB. Gastrointestinal: No abdominal pain.  No nausea, no vomiting.  Musculoskeletal: Negative for musculoskeletal pain. Skin: Negative for abrasions, lacerations, ecchymosis. Neurological: Negative for numbness or tingling   ____________________________________________   PHYSICAL EXAM:  VITAL SIGNS: ED Triage Vitals  Enc Vitals Group     BP 06/05/18 1548 118/69     Pulse Rate 06/05/18 1548 90     Resp 06/05/18 1548 16     Temp 06/05/18 1548 98.5 F (36.9 C)     Temp Source 06/05/18 1548 Oral  SpO2 06/05/18 1548 100 %     Weight 06/05/18 1549 170 lb (77.1 kg)     Height 06/05/18 1549 5\' 2"  (1.575 m)     Head Circumference --      Peak Flow --      Pain Score 06/05/18 1549 10     Pain Loc --      Pain Edu? --      Excl. in [place]? --      Constitutional: Alert and oriented. Well appearing and in no acute distress. Eyes: Conjunctivae are normal. PERRL. EOMI. Head: Atraumatic. ENT:      Ears:      Nose: No congestion/rhinnorhea.      Mouth/Throat: Mucous membranes are moist.  Neck: No stridor.   Cardiovascular: Normal rate, regular rhythm.  Good peripheral circulation. Respiratory: Normal respiratory effort without tachypnea or retractions. Lungs CTAB. Good air entry to the bases with no decreased or absent breath sounds. Musculoskeletal: Full range of motion to all extremities. No gross deformities appreciated.  1 cm x 1 cm callus to distal left plantar side of foot with puncture to center that is seeping clear and bloody fluid.  Tender to palpation.  No surrounding erythema. Neurologic:  Normal speech and language. No gross focal neurologic deficits are appreciated.  Skin:  Skin is warm, dry and intact. No rash noted. Psychiatric: Mood and affect are normal. Speech and behavior are normal. Patient exhibits appropriate insight and judgement.   ____________________________________________   LABS (all labs ordered are listed, but only abnormal results are displayed)  Labs Reviewed - No data to display ____________________________________________  EKG   ____________________________________________  RADIOLOGY Robinette Haines, personally viewed and evaluated these images (plain radiographs) as part of my medical decision making, as well as reviewing the written report by the radiologist.  Dg Foot Complete Left  Result Date: 06/05/2018 CLINICAL DATA:  Plantar wart infection EXAM: LEFT FOOT - COMPLETE 3+ VIEW COMPARISON:  None. FINDINGS: There is no evidence of fracture or dislocation. There is no evidence of arthropathy or other focal bone abnormality. Mild soft tissue induration along the plantar aspect of the forefoot without soft tissue emphysema. IMPRESSION: Mild soft tissue induration along the plantar aspect of the forefoot. No acute osseous abnormality. No soft tissue emphysematous change. Electronically Signed   By: Latravious Levitt Royalty M.D.   On: 06/05/2018 18:20    ____________________________________________    PROCEDURES  Procedure(s) performed:     Procedures    Medications  lidocaine-EPINEPHrine-tetracaine (LET) solution (has no administration in time range)  clindamycin (CLEOCIN) capsule 300 mg (300 mg Oral Given 06/05/18 1906)  ciprofloxacin (CIPRO) tablet 500 mg (500 mg Oral Given 06/05/18 1906)     ____________________________________________   INITIAL IMPRESSION / ASSESSMENT AND PLAN / ED COURSE  Pertinent labs & imaging results that were available during my care of the patient were reviewed by me and considered in my medical decision making (see chart for details).  Review of the [place] CSRS was performed in accordance of the [place] prior to dispensing any controlled drugs.     Patient's diagnosis is consistent with infected callus.  Vital signs and exam are reassuring.  Callus is actively draining.  Patient will be started on clindamycin to cover for staph and Cipro to cover for Pseudomonas..  Patient will be discharged home with prescriptions for clindamycin and Cipro. Patient is to follow up with podiatry as directed. Patient is given ED precautions to return to the ED for any worsening or  new symptoms.     ____________________________________________  FINAL CLINICAL IMPRESSION(S) / ED DIAGNOSES  Final diagnoses:  Corn infected      NEW MEDICATIONS STARTED DURING THIS VISIT:  ED Discharge Orders         Ordered    clindamycin (CLEOCIN) 300 MG capsule  3 times daily     06/05/18 1848    ciprofloxacin (CIPRO) 500 MG tablet  2 times daily     06/05/18 1848              This chart was dictated using voice recognition software/Dragon. Despite best efforts to proofread, errors can occur which can change the meaning. Any change was purely unintentional.    Laban Emperor, PA-C 06/05/18 2236    Merlyn Lot, MD 06/05/18 [phone]

## 2018-06-05 NOTE — ED Triage Notes (Signed)
Pt to ED from home c/o left foot pain with plantar worts.

## 2018-06-05 NOTE — ED Notes (Signed)
Pt to the er for pain to the left sole. Pt was tx for a plantar wart. Pt used cream with no improvement. Pt states the pain is not topical but feels as if something moved under her foot/ Pt has a dime sized open wound to the lateral side of the sole right at the 5th digit. Pt has been soaking with epsom salts, peeling. Pt says it began to drain green and bloody and had a foul odor.

## 2018-06-14 ENCOUNTER — Other Ambulatory Visit: Payer: Self-pay

## 2018-06-14 ENCOUNTER — Emergency Department
Admission: EM | Admit: 2018-06-14 | Discharge: 2018-06-14 | Disposition: A | Discharge status: Other Acute Inpatient Hospital | Payer: Self-pay | Attending: Student in an Organized Health Care Education/Training Program | Admitting: Student in an Organized Health Care Education/Training Program

## 2018-06-14 ENCOUNTER — Encounter: Payer: Self-pay | Admitting: Emergency Medicine

## 2018-06-14 ENCOUNTER — Inpatient Hospital Stay
Admission: AD | Admit: 2018-06-14 | Discharge: 2018-06-16 | DRG: 885 | Disposition: A | Discharge status: Home / Self Care | Payer: No Typology Code available for payment source | Source: Intra-hospital | Attending: Psychiatry | Admitting: Psychiatry

## 2018-06-14 DIAGNOSIS — F4325 Adjustment disorder with mixed disturbance of emotions and conduct: Secondary | ICD-10-CM | POA: Diagnosis present

## 2018-06-14 DIAGNOSIS — F1420 Cocaine dependence, uncomplicated: Secondary | ICD-10-CM | POA: Diagnosis present

## 2018-06-14 DIAGNOSIS — F332 Major depressive disorder, recurrent severe without psychotic features: Secondary | ICD-10-CM | POA: Diagnosis present

## 2018-06-14 DIAGNOSIS — Z79899 Other long term (current) drug therapy: Secondary | ICD-10-CM | POA: Diagnosis not present

## 2018-06-14 DIAGNOSIS — F1220 Cannabis dependence, uncomplicated: Secondary | ICD-10-CM | POA: Diagnosis present

## 2018-06-14 DIAGNOSIS — F32A Depression, unspecified: Secondary | ICD-10-CM

## 2018-06-14 DIAGNOSIS — F17210 Nicotine dependence, cigarettes, uncomplicated: Secondary | ICD-10-CM | POA: Diagnosis present

## 2018-06-14 DIAGNOSIS — F411 Generalized anxiety disorder: Secondary | ICD-10-CM | POA: Diagnosis present

## 2018-06-14 DIAGNOSIS — J45909 Unspecified asthma, uncomplicated: Secondary | ICD-10-CM | POA: Insufficient documentation

## 2018-06-14 DIAGNOSIS — F329 Major depressive disorder, single episode, unspecified: Secondary | ICD-10-CM

## 2018-06-14 DIAGNOSIS — F17200 Nicotine dependence, unspecified, uncomplicated: Secondary | ICD-10-CM | POA: Diagnosis present

## 2018-06-14 DIAGNOSIS — F419 Anxiety disorder, unspecified: Secondary | ICD-10-CM | POA: Insufficient documentation

## 2018-06-14 HISTORY — DX: Major depressive disorder, recurrent severe without psychotic features: F33.2

## 2018-06-14 LAB — CBC WITH DIFFERENTIAL/PLATELET
ABS IMMATURE GRANULOCYTES: 0.02 10*3/uL (ref 0.00–0.07)
BASOS PCT: 0 %
Basophils Absolute: 0 10*3/uL (ref 0.0–0.1)
Eosinophils Absolute: 0 10*3/uL (ref 0.0–0.5)
Eosinophils Relative: 0 %
HCT: 41.6 % (ref 36.0–46.0)
HEMOGLOBIN: 13.7 g/dL (ref 12.0–15.0)
Immature Granulocytes: 0 %
LYMPHS PCT: 30 %
Lymphs Abs: 2 10*3/uL (ref 0.7–4.0)
MCH: 30.8 pg (ref 26.0–34.0)
MCHC: 32.9 g/dL (ref 30.0–36.0)
MCV: 93.5 fL (ref 80.0–100.0)
Monocytes Absolute: 0.4 10*3/uL (ref 0.1–1.0)
Monocytes Relative: 6 %
NEUTROS ABS: 4.3 10*3/uL (ref 1.7–7.7)
NEUTROS PCT: 64 %
PLATELETS: 357 10*3/uL (ref 150–400)
RBC: 4.45 MIL/uL (ref 3.87–5.11)
RDW: 13.2 % (ref 11.5–15.5)
WBC: 6.7 10*3/uL (ref 4.0–10.5)
nRBC: 0 % (ref 0.0–0.2)

## 2018-06-14 LAB — URINALYSIS, COMPLETE (UACMP) WITH MICROSCOPIC
Bacteria, UA: NONE SEEN
Bilirubin Urine: NEGATIVE
GLUCOSE, UA: NEGATIVE mg/dL
Hgb urine dipstick: NEGATIVE
KETONES UR: NEGATIVE mg/dL
Leukocytes, UA: NEGATIVE
NITRITE: NEGATIVE
PH: 9 — AB (ref 5.0–8.0)
PROTEIN: NEGATIVE mg/dL
Specific Gravity, Urine: 1.018 (ref 1.005–1.030)

## 2018-06-14 LAB — URINE DRUG SCREEN, QUALITATIVE (ARMC ONLY)
AMPHETAMINES, UR SCREEN: NOT DETECTED
BENZODIAZEPINE, UR SCRN: NOT DETECTED
Barbiturates, Ur Screen: NOT DETECTED
Cannabinoid 50 Ng, Ur ~~LOC~~: POSITIVE — AB
Cocaine Metabolite,Ur ~~LOC~~: POSITIVE — AB
MDMA (ECSTASY) UR SCREEN: NOT DETECTED
METHADONE SCREEN, URINE: NOT DETECTED
Opiate, Ur Screen: NOT DETECTED
PHENCYCLIDINE (PCP) UR S: NOT DETECTED
Tricyclic, Ur Screen: NOT DETECTED

## 2018-06-14 LAB — COMPREHENSIVE METABOLIC PANEL
ALBUMIN: 4 g/dL (ref 3.5–5.0)
ALT: 15 U/L (ref 0–44)
ANION GAP: 9 (ref 5–15)
AST: 18 U/L (ref 15–41)
Alkaline Phosphatase: 68 U/L (ref 38–126)
BILIRUBIN TOTAL: 0.8 mg/dL (ref 0.3–1.2)
BUN: 6 mg/dL (ref 6–20)
CHLORIDE: 106 mmol/L (ref 98–111)
CO2: 24 mmol/L (ref 22–32)
Calcium: 8.9 mg/dL (ref 8.9–10.3)
Creatinine, Ser: 0.66 mg/dL (ref 0.44–1.00)
GFR calc Af Amer: >60 mL/min (ref >60)
GLUCOSE: 101 mg/dL — AB (ref 70–99)
POTASSIUM: 3.7 mmol/L (ref 3.5–5.1)
Sodium: 139 mmol/L (ref 135–145)
TOTAL PROTEIN: 7.7 g/dL (ref 6.5–8.1)

## 2018-06-14 LAB — ETHANOL

## 2018-06-14 MED ORDER — HYDROXYZINE HCL 25 MG PO TABS
25.0000 mg | ORAL_TABLET | Freq: Three times a day (TID) | ORAL | Status: DC | PRN
  Administered 2018-06-15 (×2): 25 mg via ORAL
  Filled 2018-06-14 (×2): qty 1

## 2018-06-14 MED ORDER — ALBUTEROL SULFATE (2.5 MG/3ML) 0.083% IN NEBU: 2.5000 mg | INHALATION_SOLUTION | RESPIRATORY_TRACT | Status: DC | PRN

## 2018-06-14 MED ORDER — MAGNESIUM HYDROXIDE 400 MG/5ML PO SUSP: 30.0000 mL | Freq: Every day | ORAL | Status: DC | PRN

## 2018-06-14 MED ORDER — ALUM & MAG HYDROXIDE-SIMETH 200-200-20 MG/5ML PO SUSP: 30.0000 mL | ORAL | Status: DC | PRN

## 2018-06-14 MED ORDER — TRAZODONE HCL 100 MG PO TABS
100.0000 mg | ORAL_TABLET | Freq: Every evening | ORAL | Status: DC | PRN
  Administered 2018-06-15 (×2): 100 mg via ORAL
  Filled 2018-06-14 (×2): qty 1

## 2018-06-14 MED ORDER — LORAZEPAM 0.5 MG PO TABS
0.5000 mg | ORAL_TABLET | Freq: Once | ORAL | Status: AC
  Administered 2018-06-14: 0.5 mg via ORAL
  Filled 2018-06-14: qty 1

## 2018-06-14 MED ORDER — ACETAMINOPHEN 325 MG PO TABS
650.0000 mg | ORAL_TABLET | Freq: Four times a day (QID) | ORAL | Status: DC | PRN
  Administered 2018-06-15: 650 mg via ORAL
  Filled 2018-06-14: qty 2

## 2018-06-14 MED ORDER — ALBUTEROL SULFATE HFA 108 (90 BASE) MCG/ACT IN AERS
1.0000 | INHALATION_SPRAY | RESPIRATORY_TRACT | Status: DC | PRN
  Filled 2018-06-14: qty 6.7

## 2018-06-14 NOTE — BH Assessment (Addendum)
Per Psych MD [facility]), patient is recommended for inpatient treatment.  Writer spoke with [facility] Attending Physician (Dr. Weber Cooks) about the patient he will put in the admission orders. Writer updated ER MD (Dr. Alfred Levins) & Patient's RN (AMy B.) of the plan for the patient. Voluntary Admission Form signed by the patient and placed on patient chart.

## 2018-06-14 NOTE — ED Triage Notes (Signed)
States has been feeling stressed and depressed x 2 days. Denies suicidal ideation.

## 2018-06-14 NOTE — ED Notes (Signed)
Pt. Currently sleeping in bed. 

## 2018-06-14 NOTE — ED Notes (Signed)
Patient is to be admitted to [facility] by Dr. Weber Cooks.  Attending Physician will be Dr. Bary Leriche.   Patient has been assigned to room 305, by [place] Nurse Coralyn Mark.   Intake Paper Work has been signed and placed on patient chart.  ER staff is aware of the admission:  [facility] ER Secretary    ER MD   Patient's Nurse   [facility] Patient Access.

## 2018-06-14 NOTE — ED Provider Notes (Signed)
[facility] Emergency Department Provider Note    First MD Initiated Contact with Patient 06/14/18 1238     (approximate)  I have reviewed the triage vital signs and the nursing notes.   HISTORY  Chief Complaint Anxiety and Depression    HPI Shirley Turner is a 27 y.o. female a history of anxiety presents the ER feeling very depressed and anxious.  Patient very tearful.  She denies any SI or HI but feels overwhelmed, hopeless, endorses anhedonia.  States that she is having trouble caring for her children.  Does not take any medications for anxiety or depression currently.  Is not seen a counselor or therapist.      Past Medical History:  Diagnosis Date  . Anxiety   . Asthma    No family history on file. Past Surgical History:  Procedure Laterality Date  . TONSILLECTOMY AND ADENOIDECTOMY     Patient Active Problem List   Diagnosis Date Noted  . History of depression 02/01/2016  . Postpartum care following vaginal delivery 01/30/2016      Prior to Admission medications   Medication Sig Start Date End Date Taking? Authorizing Provider  acetaminophen-codeine (TYLENOL #3) 300-30 MG tablet Take 1 tablet by mouth every 6 (six) hours as needed for moderate pain. 10/02/17   Menshew, Dannielle Karvonen, PA-C  albuterol (PROVENTIL HFA;VENTOLIN HFA) 108 (90 Base) MCG/ACT inhaler Inhale 2 puffs into the lungs every 4 (four) hours as needed for wheezing or shortness of breath. 09/25/17   Triplett, Cari B, FNP  butalbital-acetaminophen-caffeine (FIORICET, ESGIC) 50-325-40 MG tablet Take 1-2 tablets by mouth every 6 (six) hours as needed for headache. 08/30/17 08/30/18  Lavonia Drafts, MD  clindamycin (CLEOCIN) 300 MG capsule Take 1 capsule (300 mg total) by mouth 3 (three) times daily for 10 days. 06/05/18 06/15/18  Laban Emperor, PA-C  hydrOXYzine (ATARAX/VISTARIL) 25 MG tablet Take 1 tablet (25 mg total) by mouth 3 (three) times daily as needed for anxiety. 03/09/18    Delman Kitten, MD  mupirocin ointment (BACTROBAN) 2 % Apply 1 application topically 2 (two) times daily. 03/20/18   Caryn Section Linden Dolin, PA-C  naproxen (NAPROSYN) 500 MG tablet Take 1 tablet (500 mg total) by mouth 2 (two) times daily with a meal. 08/31/17 08/31/18  Letitia Neri L, PA-C  ondansetron (ZOFRAN ODT) 4 MG disintegrating tablet Take 1 tablet (4 mg total) by mouth every 8 (eight) hours as needed. 10/02/17   Menshew, Dannielle Karvonen, PA-C    Allergies Patient has no known allergies.    Social History Social History   Tobacco Use  . Smoking status: Current Every Day Smoker    Packs/day: 1.00    Types: Cigarettes  . Smokeless tobacco: Never Used  Substance Use Topics  . Alcohol use: No  . Drug use: Yes    Types: Marijuana    Review of Systems Patient denies headaches, rhinorrhea, blurry vision, numbness, shortness of breath, chest pain, edema, cough, abdominal pain, nausea, vomiting, diarrhea, dysuria, fevers, rashes or hallucinations unless otherwise stated above in HPI. ____________________________________________   PHYSICAL EXAM:  VITAL SIGNS: Vitals:   06/14/18 1114  BP: 125/78  Pulse: (!) 59  Resp: 18  Temp: 98.5 F (36.9 C)  SpO2: 97%    Constitutional: Alert and oriented. Tearful and anxious appearing Eyes: Conjunctivae are normal.  Head: Atraumatic. Nose: No congestion/rhinnorhea. Mouth/Throat: Mucous membranes are moist.   Neck: No stridor. Painless ROM.  Cardiovascular: Normal rate, regular rhythm. Grossly normal heart  sounds.  Good peripheral circulation. Respiratory: Normal respiratory effort.  No retractions. Lungs CTAB. Gastrointestinal: Soft and nontender. No distention. No abdominal bruits. No CVA tenderness. Genitourinary:  Musculoskeletal: No lower extremity tenderness nor edema.  No joint effusions. Neurologic:  Normal speech and language. No gross focal neurologic deficits are appreciated. No facial droop Skin:  Skin is warm, dry and intact. No  rash noted. Psychiatric: Mood and affect are anxious and tearful. Speech and behavior are normal.  ____________________________________________   LABS (all labs ordered are listed, but only abnormal results are displayed)  Results for orders placed or performed during the hospital encounter of 06/14/18 (from the past 24 hour(s))  Comprehensive metabolic panel     Status: Abnormal   Collection Time: 06/14/18 11:52 AM  Result Value Ref Range   Sodium 139 135 - 145 mmol/L   Potassium 3.7 3.5 - 5.1 mmol/L   Chloride 106 98 - 111 mmol/L   CO2 24 22 - 32 mmol/L   Glucose, Bld 101 (H) 70 - 99 mg/dL   BUN 6 6 - 20 mg/dL   Creatinine, Ser 0.66 0.44 - 1.00 mg/dL   Calcium 8.9 8.9 - 10.3 mg/dL   Total Protein 7.7 6.5 - 8.1 g/dL   Albumin 4.0 3.5 - 5.0 g/dL   AST 18 15 - 41 U/L   ALT 15 0 - 44 U/L   Alkaline Phosphatase 68 38 - 126 U/L   Total Bilirubin 0.8 0.3 - 1.2 mg/dL   GFR calc non Af Amer >60 >60 mL/min   GFR calc Af Amer >60 >60 mL/min   Anion gap 9 5 - 15  Ethanol     Status: None   Collection Time: 06/14/18 11:52 AM  Result Value Ref Range   Alcohol, Ethyl (B) <10 <10 mg/dL  Urine Drug Screen, Qualitative     Status: Abnormal   Collection Time: 06/14/18 11:52 AM  Result Value Ref Range   Tricyclic, Ur Screen NONE DETECTED NONE DETECTED   Amphetamines, Ur Screen NONE DETECTED NONE DETECTED   MDMA (Ecstasy)Ur Screen NONE DETECTED NONE DETECTED   Cocaine Metabolite,Ur [place] POSITIVE (A) NONE DETECTED   Opiate, Ur Screen NONE DETECTED NONE DETECTED   Phencyclidine (PCP) Ur S NONE DETECTED NONE DETECTED   Cannabinoid 50 Ng, Ur [place] POSITIVE (A) NONE DETECTED   Barbiturates, Ur Screen NONE DETECTED NONE DETECTED   Benzodiazepine, Ur Scrn NONE DETECTED NONE DETECTED   Methadone Scn, Ur NONE DETECTED NONE DETECTED  CBC with Diff     Status: None   Collection Time: 06/14/18 11:52 AM  Result Value Ref Range   WBC 6.7 4.0 - 10.5 K/uL   RBC 4.45 3.87 - 5.11 MIL/uL   Hemoglobin 13.7  12.0 - 15.0 g/dL   HCT 41.6 36.0 - 46.0 %   MCV 93.5 80.0 - 100.0 fL   MCH 30.8 26.0 - 34.0 pg   MCHC 32.9 30.0 - 36.0 g/dL   RDW 13.2 11.5 - 15.5 %   Platelets 357 150 - 400 K/uL   nRBC 0.0 0.0 - 0.2 %   Neutrophils Relative % 64 %   Neutro Abs 4.3 1.7 - 7.7 K/uL   Lymphocytes Relative 30 %   Lymphs Abs 2.0 0.7 - 4.0 K/uL   Monocytes Relative 6 %   Monocytes Absolute 0.4 0.1 - 1.0 K/uL   Eosinophils Relative 0 %   Eosinophils Absolute 0.0 0.0 - 0.5 K/uL   Basophils Relative 0 %   Basophils Absolute 0.0  0.0 - 0.1 K/uL   Immature Granulocytes 0 %   Abs Immature Granulocytes 0.02 0.00 - 0.07 K/uL  Urinalysis, Complete w Microscopic     Status: Abnormal   Collection Time: 06/14/18 11:52 AM  Result Value Ref Range   Color, Urine YELLOW (A) YELLOW   APPearance CLEAR (A) CLEAR   Specific Gravity, Urine 1.018 1.005 - 1.030   pH 9.0 (H) 5.0 - 8.0   Glucose, UA NEGATIVE NEGATIVE mg/dL   Hgb urine dipstick NEGATIVE NEGATIVE   Bilirubin Urine NEGATIVE NEGATIVE   Ketones, ur NEGATIVE NEGATIVE mg/dL   Protein, ur NEGATIVE NEGATIVE mg/dL   Nitrite NEGATIVE NEGATIVE   Leukocytes, UA NEGATIVE NEGATIVE   RBC / HPF 0-5 0 - 5 RBC/hpf   WBC, UA 0-5 0 - 5 WBC/hpf   Bacteria, UA NONE SEEN NONE SEEN   Squamous Epithelial / LPF 11-20 0 - 5   Mucus PRESENT    ____________________________________________ ____________________________________________  RADIOLOGY   ____________________________________________   PROCEDURES  Procedure(s) performed:  Procedures    Critical Care performed: no ____________________________________________   INITIAL IMPRESSION / ASSESSMENT AND PLAN / ED COURSE  Pertinent labs & imaging results that were available during my care of the patient were reviewed by me and considered in my medical decision making (see chart for details).   DDX: Psychosis, delirium, medication effect, noncompliance, polysubstance abuse, Si, Hi, depression   Blonnie L Krakow is a  27 y.o. who presents to the ED with for evaluation of depression and anxiety.  Patient has psych history of anxiety.  Laboratory testing was ordered to evaluation for underlying electrolyte derangement or signs of underlying organic pathology to explain today's presentation.  Based on history and physical and laboratory evaluation, it appears that the patient's presentation is 2/2 underlying psychiatric disorder and will require further evaluation and management by inpatient psychiatry.    Disposition pending psychiatric evaluation.       As part of my medical decision making, I reviewed the following data within the [place] notes reviewed and incorporated, Labs reviewed, notes from prior ED visits.   ____________________________________________   FINAL CLINICAL IMPRESSION(S) / ED DIAGNOSES  Final diagnoses:  Anxiety  Depression, unspecified depression type      NEW MEDICATIONS STARTED DURING THIS VISIT:  New Prescriptions   No medications on file     Note:  This document was prepared using Dragon voice recognition software and may include unintentional dictation errors.    Merlyn Lot, MD 06/14/18 1247

## 2018-06-14 NOTE — ED Notes (Signed)
Pt belongings placed in hospital belongings bag and numbered 1 of 1 with pt label attached. Belongings consisted of: black stretch pants, grey/black t-shirt, (no bra or underwear), Flip flops, black coat, Iphone (Iphone 6 per pt), with case and charger. Attached to back of phone is: [place] visa debit card, [employer] EBT card, [place] eWIC, Visa debit, drivers license (original copy and paper copy.)

## 2018-06-14 NOTE — ED Notes (Signed)
SOC camera placed in patient's room.
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
## 2018-06-14 NOTE — ED Notes (Signed)
SOC complete. Pt tearful, given ordered Ativan.   Maintained on 15 minute checks and observation by security for safety.

## 2018-06-14 NOTE — ED Notes (Signed)
Patient asleep in room. No noted distress or abnormal behavior. Will continue 15 minute checks and observation by security for safety.

## 2018-06-14 NOTE — ED Notes (Signed)
Report given to SOC 
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
## 2018-06-15 ENCOUNTER — Encounter: Payer: Self-pay | Admitting: Psychiatry

## 2018-06-15 ENCOUNTER — Other Ambulatory Visit: Payer: Self-pay

## 2018-06-15 DIAGNOSIS — F4325 Adjustment disorder with mixed disturbance of emotions and conduct: Secondary | ICD-10-CM

## 2018-06-15 DIAGNOSIS — F1420 Cocaine dependence, uncomplicated: Secondary | ICD-10-CM | POA: Diagnosis present

## 2018-06-15 DIAGNOSIS — F332 Major depressive disorder, recurrent severe without psychotic features: Principal | ICD-10-CM

## 2018-06-15 DIAGNOSIS — F1220 Cannabis dependence, uncomplicated: Secondary | ICD-10-CM | POA: Diagnosis present

## 2018-06-15 HISTORY — DX: Adjustment disorder with mixed disturbance of emotions and conduct: F43.25

## 2018-06-15 MED ORDER — CITALOPRAM HYDROBROMIDE 20 MG PO TABS
20.0000 mg | ORAL_TABLET | Freq: Every day | ORAL | Status: DC
  Administered 2018-06-15 – 2018-06-16 (×3): 20 mg via ORAL
  Filled 2018-06-15 (×3): qty 1

## 2018-06-15 MED ORDER — NICOTINE 14 MG/24HR TD PT24
14.0000 mg | MEDICATED_PATCH | Freq: Every day | TRANSDERMAL | Status: DC
  Administered 2018-06-15 – 2018-06-16 (×2): 14 mg via TRANSDERMAL
  Filled 2018-06-15 (×2): qty 1

## 2018-06-15 MED ORDER — IBUPROFEN 600 MG PO TABS
800.0000 mg | ORAL_TABLET | Freq: Three times a day (TID) | ORAL | Status: DC
  Administered 2018-06-15: 800 mg via ORAL
  Filled 2018-06-15 (×2): qty 1

## 2018-06-15 NOTE — Plan of Care (Signed)
Patient just recently admitted to the unit. Patient has not had sufficient time to show progressions at this time. Will continue to monitor for progressions.    Problem: Activity: Goal: Interest or engagement in leisure activities will improve Outcome: Not Progressing Goal: Imbalance in normal sleep/wake cycle will improve Outcome: Not Progressing   Problem: Coping: Goal: Coping ability will improve Outcome: Not Progressing Goal: Will verbalize feelings Outcome: Not Progressing   Problem: Health Behavior/Discharge Planning: Goal: Compliance with therapeutic regimen will improve Outcome: Not Progressing   Problem: Safety: Goal: Ability to disclose and discuss suicidal ideas will improve Outcome: Not Progressing Goal: Ability to identify and utilize support systems that promote safety will improve Outcome: Not Progressing   Problem: Education: Goal: Knowledge of disease or condition will improve Outcome: Not Progressing   Problem: Safety: Goal: Ability to remain free from injury will improve Outcome: Not Progressing   Problem: Self-Concept: Goal: Level of anxiety will decrease Outcome: Not Progressing

## 2018-06-15 NOTE — Plan of Care (Signed)
In the milieu. Anxious but compliant with treatment
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
## 2018-06-15 NOTE — BHH Group Notes (Signed)
LCSW Group Therapy Note 06/15/2018 1:15pm  Type of Therapy and Topic: Group Therapy: Feelings Around Returning Home & Establishing a Supportive Framework and Supporting Oneself When Supports Not Available  Participation Level: Active  Description of Group:  Patients first processed thoughts and feelings about upcoming discharge. These included fears of upcoming changes, lack of change, new living environments, judgements and expectations from others and overall stigma of mental health issues. The group then discussed the definition of a supportive framework, what that looks and feels like, and how do to discern it from an unhealthy non-supportive network. The group identified different types of supports as well as what to do when your family/friends are less than helpful or unavailable  Therapeutic Goals  1. Patient will identify one healthy supportive network that they can use at discharge. 2. Patient will identify one factor of a supportive framework and how to tell it from an unhealthy network. 3. Patient able to identify one coping skill to use when they do not have positive supports from others. 4. Patient will demonstrate ability to communicate their needs through discussion and/or role plays.  Summary of Patient Progress:  Patient reported she feel "anxious." Pt engaged during group session. As patients processed their anxiety about discharge and described healthy supports patient shared she is ready to be discharge.  Patients identified at least one self-care tool they were willing to use after discharge.   Therapeutic Modalities Cognitive Behavioral Therapy Motivational Interviewing   Venissa Nappi  CUEBAS-COLON, LCSW 06/15/2018 12:08 PM

## 2018-06-15 NOTE — BHH Suicide Risk Assessment (Signed)
[facility] Admission Suicide Risk Assessment   Nursing information obtained from:  Patient, Review of record Demographic factors:  Adolescent or young adult, Low socioeconomic status Current Mental Status:  NA(Denies) Loss Factors:  Financial problems / change in socioeconomic status Historical Factors:  NA(Denies) Risk Reduction Factors:  Responsible for children under 28 years of age, Sense of responsibility to family, Religious beliefs about death, Living with another person, especially a relative, Positive social support, Positive therapeutic relationship  Total Time spent with patient: 1 hour Principal Problem: Severe recurrent major depression without psychotic features ([place]) Diagnosis:  Principal Problem:   Severe recurrent major depression without psychotic features ([place]) Active Problems:   Cocaine abuse ([place])   Cannabis abuse   Adjustment disorder with mixed disturbance of emotions and conduct  Subjective Data: Patient presented to the emergency room after becoming very anxious and agitated at home.  Reports chronic anxiety and agitation and depression which have been worse recently.  Patient denies any suicidal thoughts or wish to die.  Denies psychotic symptoms.  Admits to some substance use problems.  Not currently receiving outpatient treatment.  Continued Clinical Symptoms:  Alcohol Use Disorder Identification Test Final Score (AUDIT): 0 The "Alcohol Use Disorders Identification Test", Guidelines for Use in Primary Care, Second Edition.  World [employer] [facility]). Score between 0-7:  no or low risk or alcohol related problems. Score between 8-15:  moderate risk of alcohol related problems. Score between 16-19:  high risk of alcohol related problems. Score 20 or above:  warrants further diagnostic evaluation for alcohol dependence and treatment.   CLINICAL FACTORS:   Panic Attacks Depression:   Hopelessness Impulsivity Alcohol/Substance  Abuse/Dependencies   Musculoskeletal: Strength & Muscle Tone: within normal limits Gait & Station: normal Patient leans: N/A  Psychiatric Specialty Exam: Physical Exam  ROS  Blood pressure 112/81, pulse 62, temperature 98.2 F (36.8 C), temperature source Oral, resp. rate 18, height 5\' 2"  (1.575 m), weight 72.6 kg, last menstrual period 06/05/2018, SpO2 100 %, unknown if currently breastfeeding.Body mass index is 29.26 kg/m.  General Appearance: Casual  Eye Contact:  Good  Speech:  Clear and Coherent  Volume:  Normal  Mood:  Anxious  Affect:  Appropriate  Thought Process:  Goal Directed  Orientation:  Full (Time, Place, and Person)  Thought Content:  Logical  Suicidal Thoughts:  No  Homicidal Thoughts:  No  Memory:  Immediate;   Fair Recent;   Fair Remote;   Fair  Judgement:  Fair  Insight:  Fair  Psychomotor Activity:  Decreased  Concentration:  Concentration: Fair  Recall:  [employer] of Knowledge:  Fair  Language:  Fair  Akathisia:  No  Handed:  Right  AIMS (if indicated):     Assets:  Desire for Improvement  ADL's:  Intact  Cognition:  WNL  Sleep:  Number of Hours: 4.45      COGNITIVE FEATURES THAT CONTRIBUTE TO RISK:  Thought constriction (tunnel vision)    SUICIDE RISK:   Minimal: No identifiable suicidal ideation.  Patients presenting with no risk factors but with morbid ruminations; may be classified as minimal risk based on the severity of the depressive symptoms  PLAN OF CARE: Patient admitted to the psychiatric ward.  15-minute checks.  Assessment completed.  Individual and group therapy.  Recommend considering medication.  Full reassessment of suicidal risk before discharge with appropriate discharge planning made.  I certify that inpatient services furnished can reasonably be expected to improve the patient's condition.   Jenny Reichmann  Clapacs, MD 06/15/2018, 3:29 PM

## 2018-06-15 NOTE — Progress Notes (Signed)
D: Received patient from [facility] Emergency Department with report received from Rodman Key, RN at 23:05pm with patient received at 2325pm. Patient skin assessment completed with Heather, BHT, skin is intact, but did have two scabbed over large warts on her feet. No contraband found during the search of the patient with all unit prohibited items locked and stored away for discharge. Pt. Was admitted under the services of, Dr. Bary Leriche.  Patient upon admissions to the unit able to be complaint with the admissions process and filling out required documentation. During our interviewing the Pt. Reports that she came to the hospital do to worsening depression, hopelessness, and anxiety problems, that she needs help managing. Pt. Expresses desires to get on prescription medication to help her mood. The pt. Also expresses that she blames herself for not being a better parent for her children and reports she wishes she was better in their lives. Pt. Admits to this writer that she has been self medicating on cannabis and cocaine to deal with the stressors in her life and would like help with substance abuse as well. Pt. Denies si/hi/avh. Pt. Reports depression and anxiety are very high.     A: Patient oriented to unit/room/call light. Pt. Given extensive admissions education the patient verbalizes understanding. Patient was encourage to participate in unit activities and continue with plan of care. Q x 15 minute observation checks were initiated for safety. Pt. Given meal tray before bed. Pt. Given PRN medications for reported anxiety, sleep-aid, and a reported minor headache.   R: Patient is receptive to treatment plan put into place with direct input from the patient and safety to be maintained on the unit per MD orders.
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
## 2018-06-15 NOTE — Tx Team (Signed)
Initial Treatment Plan 06/15/2018 12:27 AM Artelia Laroche Shawn Felkins NPY:[id number]    PATIENT STRESSORS: Financial difficulties Marital or family conflict Substance abuse Other: Depression, anxiety   PATIENT STRENGTHS: Ability for insight Capable of independent living Communication skills General fund of knowledge Motivation for treatment/growth Physical Health Supportive family/friends Work skills   PATIENT IDENTIFIED PROBLEMS: Depression  Substance Abuse  Anxiety                 DISCHARGE CRITERIA:  Improved stabilization in mood, thinking, and/or behavior Motivation to continue treatment in a less acute level of care Need for constant or close observation no longer present Verbal commitment to aftercare and medication compliance  PRELIMINARY DISCHARGE PLAN: Outpatient therapy Participate in family therapy Return to previous living arrangement Return to previous work or school arrangements  PATIENT/FAMILY INVOLVEMENT: This treatment plan has been presented to and reviewed with the patient, Zenith Kercheval Delker.  The patient has been given the opportunity to ask questions and make suggestions.  Reyes Ivan, RN 06/15/2018, 12:27 AM

## 2018-06-15 NOTE — Progress Notes (Signed)
Received Marrion this AM in bedd asleep, she slept through breakfast, she got up for lunch and dinner and was more visible in the milieu this evening. She was compliant with her Celexa. Later she c/o of a headache and was ordered motrin 800 mg.

## 2018-06-15 NOTE — Progress Notes (Signed)
Patient was sitting in the dayroom with peers. Alert and oriented. Pleasant and cooperative. Anxious and depressed but denying suicidal/homicidal thoughts. Denying hallucinations.  Visited with her aunt who appeared to be supportive. Patent complained of headache (migraine) and received Ibuprofen. Had no other concerns so far. Emotional support provided. Was encouraged to express thoughts and concerns as needed. Staff continue to monitor for safety.

## 2018-06-16 ENCOUNTER — Encounter: Payer: Self-pay | Admitting: Psychiatry

## 2018-06-16 DIAGNOSIS — F17200 Nicotine dependence, unspecified, uncomplicated: Secondary | ICD-10-CM | POA: Diagnosis present

## 2018-06-16 MED ORDER — HYDROXYZINE HCL 25 MG PO TABS: 25.0000 mg | ORAL_TABLET | Freq: Three times a day (TID) | ORAL | 1 refills | Status: DC | PRN

## 2018-06-16 MED ORDER — TRAZODONE HCL 100 MG PO TABS: 100.0000 mg | ORAL_TABLET | Freq: Every evening | ORAL | 1 refills | Status: DC | PRN

## 2018-06-16 MED ORDER — CITALOPRAM HYDROBROMIDE 20 MG PO TABS: 20.0000 mg | ORAL_TABLET | Freq: Every day | ORAL | 1 refills | Status: DC

## 2018-06-16 NOTE — Tx Team (Signed)
Interdisciplinary Treatment and Diagnostic Plan Update  06/16/2018 Time of Session: 8:29 AM  Shirley Turner MRN: [id number]  Principal Diagnosis: Severe recurrent major depression without psychotic features ([place])  Secondary Diagnoses: Principal Problem:   Severe recurrent major depression without psychotic features ([place]) Active Problems:   Cocaine abuse ([place])   Cannabis abuse   Adjustment disorder with mixed disturbance of emotions and conduct   Current Medications:  Current Facility-Administered Medications  Medication Dose Route Frequency Provider Last Rate Last Dose  . acetaminophen (TYLENOL) tablet 650 mg  650 mg Oral Q6H PRN Clapacs, Madie Reno, MD   650 mg at 06/15/18 0024  . albuterol (PROVENTIL HFA;VENTOLIN HFA) 108 (90 Base) MCG/ACT inhaler 1 puff  1 puff Inhalation Q4H PRN Pucilowska, Jolanta B, MD      . alum & mag hydroxide-simeth (MAALOX/MYLANTA) 200-200-20 MG/5ML suspension 30 mL  30 mL Oral Q4H PRN Clapacs, John T, MD      . citalopram (CELEXA) tablet 20 mg  20 mg Oral Daily Clapacs, John T, MD   20 mg at 06/15/18 2118  . hydrOXYzine (ATARAX/VISTARIL) tablet 25 mg  25 mg Oral TID PRN Clapacs, Madie Reno, MD   25 mg at 06/15/18 1207  . ibuprofen (ADVIL,MOTRIN) tablet 800 mg  800 mg Oral TID Clapacs, Madie Reno, MD   800 mg at 06/15/18 2004  . magnesium hydroxide (MILK OF MAGNESIA) suspension 30 mL  30 mL Oral Daily PRN Clapacs, John T, MD      . nicotine (NICODERM CQ - dosed in mg/24 hours) patch 14 mg  14 mg Transdermal Daily Clapacs, Madie Reno, MD   14 mg at 06/15/18 1207  . traZODone (DESYREL) tablet 100 mg  100 mg Oral QHS PRN Clapacs, Madie Reno, MD   100 mg at 06/15/18 2118    PTA Medications: Medications Prior to Admission  Medication Sig Dispense Refill Last Dose  . acetaminophen-codeine (TYLENOL #3) 300-30 MG tablet Take 1 tablet by mouth every 6 (six) hours as needed for moderate pain. 10 tablet 0   . albuterol (PROVENTIL HFA;VENTOLIN HFA) 108 (90 Base) MCG/ACT inhaler  Inhale 2 puffs into the lungs every 4 (four) hours as needed for wheezing or shortness of breath. 1 Inhaler 1   . butalbital-acetaminophen-caffeine (FIORICET, ESGIC) 50-325-40 MG tablet Take 1-2 tablets by mouth every 6 (six) hours as needed for headache. 20 tablet 0   . [EXPIRED] clindamycin (CLEOCIN) 300 MG capsule Take 1 capsule (300 mg total) by mouth 3 (three) times daily for 10 days. 30 capsule 0   . hydrOXYzine (ATARAX/VISTARIL) 25 MG tablet Take 1 tablet (25 mg total) by mouth 3 (three) times daily as needed for anxiety. 15 tablet 0   . mupirocin ointment (BACTROBAN) 2 % Apply 1 application topically 2 (two) times daily. 22 g 0   . naproxen (NAPROSYN) 500 MG tablet Take 1 tablet (500 mg total) by mouth 2 (two) times daily with a meal. 20 tablet 0   . ondansetron (ZOFRAN ODT) 4 MG disintegrating tablet Take 1 tablet (4 mg total) by mouth every 8 (eight) hours as needed. 15 tablet 0     Patient Stressors: Financial difficulties Marital or family conflict Substance abuse Other: Depression, anxiety  Patient Strengths: Ability for insight Capable of independent living Communication skills General fund of knowledge Motivation for treatment/growth Physical Health Supportive family/friends Work skills  Treatment Modalities: Medication Management, Group therapy, Case management,  1 to 1 session with clinician, Psychoeducation, Recreational therapy.   Physician  Treatment Plan for Primary Diagnosis: Severe recurrent major depression without psychotic features ([place]) Long Term Goal(s): Improvement in symptoms so as ready for discharge  Short Term Goals: Ability to verbalize feelings will improve Ability to disclose and discuss suicidal ideas Ability to maintain clinical measurements within normal limits will improve Compliance with prescribed medications will improve  Medication Management: Evaluate patient's response, side effects, and tolerance of medication regimen.  Therapeutic  Interventions: 1 to 1 sessions, Unit Group sessions and Medication administration.  Evaluation of Outcomes: Adequate for Discharge  Physician Treatment Plan for Secondary Diagnosis: Principal Problem:   Severe recurrent major depression without psychotic features ([place]) Active Problems:   Cocaine abuse ([place])   Cannabis abuse   Adjustment disorder with mixed disturbance of emotions and conduct   Long Term Goal(s): Improvement in symptoms so as ready for discharge  Short Term Goals: Ability to verbalize feelings will improve Ability to disclose and discuss suicidal ideas Ability to maintain clinical measurements within normal limits will improve Compliance with prescribed medications will improve  Medication Management: Evaluate patient's response, side effects, and tolerance of medication regimen.  Therapeutic Interventions: 1 to 1 sessions, Unit Group sessions and Medication administration.  Evaluation of Outcomes: Adequate for Discharge   RN Treatment Plan for Primary Diagnosis: Severe recurrent major depression without psychotic features ([place]) Long Term Goal(s): Knowledge of disease and therapeutic regimen to maintain health will improve  Short Term Goals: Ability to identify and develop effective coping behaviors will improve and Compliance with prescribed medications will improve  Medication Management: RN will administer medications as ordered by provider, will assess and evaluate patient's response and provide education to patient for prescribed medication. RN will report any adverse and/or side effects to prescribing provider.  Therapeutic Interventions: 1 on 1 counseling sessions, Psychoeducation, Medication administration, Evaluate responses to treatment, Monitor vital signs and CBGs as ordered, Perform/monitor CIWA, COWS, AIMS and Fall Risk screenings as ordered, Perform wound care treatments as ordered.  Evaluation of Outcomes: Adequate for Discharge   LCSW Treatment  Plan for Primary Diagnosis: Severe recurrent major depression without psychotic features ([place]) Long Term Goal(s): Safe transition to appropriate next level of care at discharge, Engage patient in therapeutic group addressing interpersonal concerns.  Short Term Goals: Engage patient in aftercare planning with referrals and resources  Therapeutic Interventions: Assess for all discharge needs, 1 to 1 time with Social worker, Explore available resources and support systems, Assess for adequacy in community support network, Educate family and significant other(s) on suicide prevention, Complete Psychosocial Assessment, Interpersonal group therapy.  Evaluation of Outcomes: Met  Return home, follow up [facility]   Progress in Treatment: Attending groups: Yes Participating in groups: Yes Taking medication as prescribed: Yes Toleration medication: Yes, no side effects reported at this time Family/Significant other contact made: No Patient understands diagnosis: Yes AEB asking for help with anxiety Discussing patient identified problems/goals with staff: Yes Medical problems stabilized or resolved: Yes Denies suicidal/homicidal ideation: Yes Issues/concerns per patient self-inventory: None Other: N/A  New problem(s) identified: None identified at this time.   New Short Term/Long Term Goal(s): "I'm ready to go home.".   Discharge Plan or Barriers:   Reason for Continuation of Hospitalization: Anxiety  Medication stabilization   Estimated Length of Stay: D/C today  Attendees: Patient: Shirley Turner 06/16/2018  8:29 AM  Physician: Dr Milana Na, MD 06/16/2018  8:29 AM  Nursing: Meredith Mody, RN 06/16/2018  8:29 AM  RN Care Manager:  06/16/2018  8:29 AM  Social Worker: Ripley Fraise 06/16/2018  8:29 AM  Recreational Therapist: Isaias Sakai 06/16/2018  8:29 AM  Other: Norberto Sorenson 06/16/2018  8:29 AM  Other:  06/16/2018  8:29 AM    Scribe for Treatment Team:  Roque Lias LCSW 06/16/2018 8:29 AM

## 2018-06-16 NOTE — BHH Suicide Risk Assessment (Signed)
[facility] Discharge Suicide Risk Assessment   Principal Problem: Severe recurrent major depression without psychotic features [facility]) Discharge Diagnoses: Principal Problem:   Severe recurrent major depression without psychotic features ([place]) Active Problems:   Cocaine abuse ([place])   Cannabis abuse   Adjustment disorder with mixed disturbance of emotions and conduct   Total Time spent with patient: 20 minutes plus 15 min on care coordination and documentation  Musculoskeletal: Strength & Muscle Tone: within normal limits Gait & Station: normal Patient leans: N/A  Psychiatric Specialty Exam: Review of Systems  Neurological: Negative.   Psychiatric/Behavioral: Negative.   All other systems reviewed and are negative.   Blood pressure 112/81, pulse 62, temperature 98.2 F (36.8 C), temperature source Oral, resp. rate 18, height 5\' 2"  (1.575 m), weight 72.6 kg, last menstrual period 06/05/2018, SpO2 100 %, unknown if currently breastfeeding.Body mass index is 29.26 kg/m.  General Appearance: Casual  Eye Contact::  Good  Speech:  Clear and Coherent409  Volume:  Normal  Mood:  Euthymic  Affect:  Appropriate  Thought Process:  Goal Directed and Descriptions of Associations: Intact  Orientation:  Full (Time, Place, and Person)  Thought Content:  WDL  Suicidal Thoughts:  No  Homicidal Thoughts:  No  Memory:  Immediate;   Fair Recent;   Fair Remote;   Fair  Judgement:  Fair  Insight:  Present  Psychomotor Activity:  Normal  Concentration:  Fair  Recall:  [employer] [place]  Language: Fair  Akathisia:  No  Handed:  Right  AIMS (if indicated):     Assets:  Communication Skills Desire for Improvement Housing Physical Health Resilience Social Support  Sleep:  Number of Hours: 7.75  Cognition: WNL  ADL's:  Intact   Mental Status Per Nursing Assessment::   On Admission:  NA(Denies)  Demographic Factors:  Unemployed  Loss Factors: NA  Historical  Factors: Impulsivity  Risk Reduction Factors:   Responsible for children under 71 years of age, Sense of responsibility to family, Living with another person, especially a relative and Positive social support  Continued Clinical Symptoms:  Depression:   Impulsivity  Cognitive Features That Contribute To Risk:  None    Suicide Risk:  Minimal: No identifiable suicidal ideation.  Patients presenting with no risk factors but with morbid ruminations; may be classified as minimal risk based on the severity of the depressive symptoms  Follow-up Information    [facility] Follow up on 06/23/2018.   Why:  Monday at 7:30 for your hospital follow up appointment.  Call Lanae Boast to confirm that he will be by to pick you up. Contact information: [place] [address] [phone]           Plan Of Care/Follow-up recommendations:  Activity:  as tolerated Diet:  regular Other:  keep follow up appointments  Orson Slick, MD 06/16/2018, 12:37 PM

## 2018-06-16 NOTE — Progress Notes (Signed)
Received Shirley Turner this AM in her room, she lept through breakfast. She was compliant with her medications. She denied her Motrin 800 mg because she does not have a migraine. She is focused on going home to and feels safe to be discharge. She attended her treatment session. She denied all of the psychiatric symptoms including feeling suicidal. She received her discharge order,the AVS was reviewed and her questions answered. She received her medications and  prescriptions. Her personal items were returned. She was picked up by her father's girlfriend at 1750 hrs.

## 2018-06-16 NOTE — Progress Notes (Signed)
  [facility] Adult Case Management Discharge Plan :  Will you be returning to the same living situation after discharge:  Yes,  home At discharge, do you have transportation home?: Yes,  family Do you have the ability to pay for your medications: Yes,  mental health  Release of information consent forms completed and in the chart;  Patient's signature needed at discharge.  Patient to Follow up at: Follow-up Information    [facility] Follow up on 06/23/2018.   Why:  Monday at 7:30 for your hospital follow up appointment.  Call Lanae Boast to confirm that he will be by to pick you up. Contact information: [place] [address] [phone]           Next level of care provider has access to [place] and Suicide Prevention discussed: Yes,  yes  Have you used any form of tobacco in the last 30 days? (Cigarettes, Smokeless Tobacco, Cigars, and/or Pipes): Yes  Has patient been referred to the Quitline?: Patient refused referral  Patient has been referred for addiction treatment: Yes  Trish Mage, LCSW 06/16/2018, 1:14 PM
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
## 2018-06-16 NOTE — Progress Notes (Signed)
Recreation Therapy Notes  Date: 06/16/18  Time: 9:30 am  Location: Craft Room  Behavioral response: Appropriate   Intervention Topic: Leisure  Discussion/Intervention:  Group content today was focused on leisure. The group defined what leisure is and some positive leisure activities they participate in. Individuals identified the difference between good and bad leisure. Participants expressed how they feel after participating in the leisure of their choice. The group discussed how they go about picking a leisure activity and if others are involved in their leisure activities. The patient stated how many leisure activities they too choose from and reasons why it is important to have leisure time. Individuals participated in the intervention "Leisure Jeopardy" where they had a chance to identify new leisure activities as well as benefits of leisure. Clinical Observations/Feedback:  Patient came to group late and identified her leisure activities as watching television, spending time with her kids and listening to music. She explained that sometimes she gets a babysitter to enjoy her leisure time alone. Individual was social with peers and staff while participating in the intervention.  Love Chowning LRT/CTRS        Shirley Turner 06/16/2018 11:58 AM

## 2018-06-16 NOTE — [facility] Suicide Risk Assessment (Signed)
[place] INPATIENT:  Family/Significant Other Suicide Prevention Education  Suicide Prevention Education:  Education Completed; No one has been identified by the patient as the family member/significant other with whom the patient will be residing, and identified as the person(s) who will aid the patient in the event of a mental health crisis (suicidal ideations/suicide attempt).  With written consent from the patient, the family member/significant other has been provided the following suicide prevention education, prior to the and/or following the discharge of the patient.  The suicide prevention education provided includes the following:  Suicide risk factors  Suicide prevention and interventions  National Suicide Hotline telephone number  [facility] assessment telephone number  [facility] Emergency Assistance [place] and/or Residential Mobile Crisis Unit telephone number  Request made of family/significant other to:  Remove weapons (e.g., guns, rifles, knives), all items previously/currently identified as safety concern.    Remove drugs/medications (over-the-counter, prescriptions, illicit drugs), all items previously/currently identified as a safety concern.  The family member/significant other verbalizes understanding of the suicide prevention education information provided.  The family member/significant other agrees to remove the items of safety concern listed above. The patient did not endorse SI at the time of admission, nor did the patient c/o SI during the stay here.  SPE not required. However, I did speak with mother Prim Morace, [phone], and went over crises plan and treatment team recommendations.  Trish Mage 06/16/2018, 1:12 PM

## 2018-06-20 ENCOUNTER — Other Ambulatory Visit: Payer: Self-pay

## 2018-06-20 ENCOUNTER — Emergency Department
Admission: EM | Admit: 2018-06-20 | Discharge: 2018-06-20 | Disposition: A | Discharge status: Home / Self Care | Payer: Self-pay | Attending: Emergency Medicine | Admitting: Emergency Medicine

## 2018-06-20 ENCOUNTER — Emergency Department: Payer: Self-pay

## 2018-06-20 DIAGNOSIS — R0789 Other chest pain: Secondary | ICD-10-CM

## 2018-06-20 DIAGNOSIS — F17210 Nicotine dependence, cigarettes, uncomplicated: Secondary | ICD-10-CM | POA: Insufficient documentation

## 2018-06-20 DIAGNOSIS — J40 Bronchitis, not specified as acute or chronic: Secondary | ICD-10-CM | POA: Insufficient documentation

## 2018-06-20 DIAGNOSIS — F419 Anxiety disorder, unspecified: Secondary | ICD-10-CM | POA: Insufficient documentation

## 2018-06-20 LAB — BASIC METABOLIC PANEL
Anion gap: 12 (ref 5–15)
BUN: 8 mg/dL (ref 6–20)
CHLORIDE: 106 mmol/L (ref 98–111)
CO2: 21 mmol/L — ABNORMAL LOW (ref 22–32)
CREATININE: 0.84 mg/dL (ref 0.44–1.00)
Calcium: 9.1 mg/dL (ref 8.9–10.3)
GFR calc Af Amer: >60 mL/min (ref >60)
GFR calc non Af Amer: >60 mL/min (ref >60)
Glucose, Bld: 91 mg/dL (ref 70–99)
POTASSIUM: 3.3 mmol/L — AB (ref 3.5–5.1)
SODIUM: 139 mmol/L (ref 135–145)

## 2018-06-20 LAB — CBC
HEMATOCRIT: 38.1 % (ref 36.0–46.0)
HEMOGLOBIN: 13.3 g/dL (ref 12.0–15.0)
MCH: 31.6 pg (ref 26.0–34.0)
MCHC: 34.9 g/dL (ref 30.0–36.0)
MCV: 90.5 fL (ref 80.0–100.0)
Platelets: 284 10*3/uL (ref 150–400)
RBC: 4.21 MIL/uL (ref 3.87–5.11)
RDW: 13 % (ref 11.5–15.5)
WBC: 11.1 10*3/uL — AB (ref 4.0–10.5)
nRBC: 0 % (ref 0.0–0.2)

## 2018-06-20 LAB — TROPONIN I: Troponin I: <0.03 ng/mL (ref <0.03)

## 2018-06-20 MED ORDER — CITALOPRAM HYDROBROMIDE 20 MG PO TABS
20.0000 mg | ORAL_TABLET | Freq: Every day | ORAL | Status: DC
  Administered 2018-06-20: 20 mg via ORAL
  Filled 2018-06-20: qty 1

## 2018-06-20 MED ORDER — ONDANSETRON 4 MG PO TBDP
4.0000 mg | ORAL_TABLET | Freq: Once | ORAL | Status: AC
  Administered 2018-06-20: 4 mg via ORAL
  Filled 2018-06-20: qty 1

## 2018-06-20 MED ORDER — ALBUTEROL SULFATE (2.5 MG/3ML) 0.083% IN NEBU
2.5000 mg | INHALATION_SOLUTION | Freq: Once | RESPIRATORY_TRACT | Status: AC
  Administered 2018-06-20: 2.5 mg via RESPIRATORY_TRACT
  Filled 2018-06-20: qty 3

## 2018-06-20 MED ORDER — ALBUTEROL SULFATE HFA 108 (90 BASE) MCG/ACT IN AERS: 2.0000 | INHALATION_SPRAY | Freq: Four times a day (QID) | RESPIRATORY_TRACT | 0 refills | Status: DC | PRN

## 2018-06-20 NOTE — ED Triage Notes (Signed)
Pt states CP that began this AM and anxiety. Pt is hyperventilating in triage. States she feels numb. Missed doses for anxiety yesterday. A&O, speaking in complete sentences.

## 2018-06-20 NOTE — Discharge Instructions (Signed)
Use the albuterol inhaler every 4-6 hours as needed for shortness of breath or chest tightness.  Continue to take your Celexa and other medications as prescribed and try to be careful not to miss any doses.  Return to the ER for new, worsening, persistent severe shortness of breath, chest pain, anxiety, weakness, or any other new or worsening symptoms that concern you.

## 2018-06-20 NOTE — ED Provider Notes (Signed)
[facility] Emergency Department Provider Note ____________________________________________   First MD Initiated Contact with Patient 06/20/18 1545     (approximate)  I have reviewed the triage vital signs and the nursing notes.   HISTORY  Chief Complaint Chest Pain and Anxiety    HPI Shirley Turner is a 27 y.o. female with PMH as noted below who presents with chest pain, gradual onset since this morning, described as tightness and associated with shortness of breath.  The patient states that when she initially felt the tightness she became anxious and this made it worse.  The patient was recently hospitalized for anxiety and started on some medications.  She states that she missed a dose yesterday and has not taken it yet today.  She denies fever or vomiting.  She also denies SI or HI, or worsening depression.   Past Medical History:  Diagnosis Date  . Anxiety   . Asthma     Patient Active Problem List   Diagnosis Date Noted  . Tobacco use disorder 06/16/2018  . Cocaine use disorder, moderate, dependence ([place]) 06/15/2018  . Cannabis use disorder, moderate, dependence ([place]) 06/15/2018  . 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 Severe recurrent major depression without psychotic features ([place]) 06/14/2018  . History of depression 02/01/2016  . Postpartum care following vaginal delivery 01/30/2016    Past Surgical History:  Procedure Laterality Date  . TONSILLECTOMY AND ADENOIDECTOMY      Prior to Admission medications   Medication Sig Start Date End Date Taking? Authorizing Provider  albuterol (PROVENTIL HFA;VENTOLIN HFA) 108 (90 Base) MCG/ACT inhaler Inhale 2 puffs into the lungs every 6 (six) hours as needed for wheezing or shortness of breath. 06/20/18   Arta Silence, MD  citalopram (CELEXA) 20 MG tablet Take 1 tablet (20 mg total) by mouth daily. 06/17/18   Pucilowska, Herma Ard B, MD    hydrOXYzine (ATARAX/VISTARIL) 25 MG tablet Take 1 tablet (25 mg total) by mouth 3 (three) times daily as needed for anxiety. 06/16/18   Pucilowska, Herma Ard B, MD  traZODone (DESYREL) 100 MG tablet Take 1 tablet (100 mg total) by mouth at bedtime as needed for sleep. 06/16/18   Pucilowska, Wardell Honour, MD    Allergies Patient has no known allergies.  History reviewed. No pertinent family history.  Social History Social History   Tobacco Use  . Smoking status: Current Every Day Smoker    Packs/day: 1.00    Types: Cigarettes  . Smokeless tobacco: Never Used  Substance Use Topics  . Alcohol use: No  . Drug use: Yes    Types: Marijuana    Comment: "a couple days ago"     Review of Systems  Constitutional: No fever/chills Eyes: No visual changes. ENT: No sore throat. Cardiovascular: Positive for chest pain. Respiratory: Positive for shortness of breath. Gastrointestinal: No vomiting. Genitourinary: Negative for dysuria.  Musculoskeletal: Negative for back pain. Skin: Negative for rash. Neurological: Negative for headache.  ____________________________________________   PHYSICAL EXAM:  VITAL SIGNS: ED Triage Vitals  Enc Vitals Group     BP 06/20/18 1457 103/65     Pulse Rate 06/20/18 1457 83     Resp 06/20/18 1457 (!) 22     Temp 06/20/18 1457 97.8 F (36.6 C)     Temp Source 06/20/18 1457 Oral     SpO2 06/20/18 1457 100 %     Weight 06/20/18 1458 168 lb (76.2 kg)     Height  06/20/18 1458 5\' 2"  (1.575 m)     Head Circumference --      Peak Flow --      Pain Score 06/20/18 1458 10     Pain Loc --      Pain Edu? --      Excl. in [place]? --     Constitutional: Alert and oriented.  Anxious appearing but in no acute distress.   Eyes: Conjunctivae are normal.  Head: Atraumatic. Nose: No congestion/rhinnorhea. Mouth/Throat: Mucous membranes are moist.   Neck: Normal range of motion.  Cardiovascular: Normal rate, regular rhythm. Grossly normal heart sounds.  Good  peripheral circulation. Respiratory: Normal respiratory effort.  No retractions.  Slightly decreased breath sounds bilaterally with scattered wheezing. Gastrointestinal:  No distention.  Musculoskeletal:  Extremities warm and well perfused.  Neurologic:  Normal speech and language. No gross focal neurologic deficits are appreciated.  Skin:  Skin is warm and dry. No rash noted. Psychiatric: Mood and affect are normal. Speech and behavior are normal.  ____________________________________________   LABS (all labs ordered are listed, but only abnormal results are displayed)  Labs Reviewed  BASIC METABOLIC PANEL - Abnormal; Notable for the following components:      Result Value   Potassium 3.3 (*)    CO2 21 (*)    All other components within normal limits  CBC - Abnormal; Notable for the following components:   WBC 11.1 (*)    All other components within normal limits  TROPONIN I  POC URINE PREG, ED   ____________________________________________  EKG  ED ECG REPORT I, Arta Silence, the attending physician, personally viewed and interpreted this ECG.  Date: 06/20/2018 EKG Time: 1504 Rate: 79 Rhythm: normal sinus rhythm QRS Axis: normal Intervals: normal ST/T Wave abnormalities: Nonspecific T wave inversions anteriorly Narrative Interpretation: Nonspecific anterior T wave abnormality with no prior EKG available for comparison ischemia  ____________________________________________  RADIOLOGY  CXR: No focal infiltrate or other acute findings  ____________________________________________   PROCEDURES  Procedure(s) performed: No  Procedures  Critical Care performed: No ____________________________________________   INITIAL IMPRESSION / ASSESSMENT AND PLAN / ED COURSE  Pertinent labs & imaging results that were available during my care of the patient were reviewed by me and considered in my medical decision making (see chart for details).  27 year old  female with PMH as noted above presents with shortness of breath, chest discomfort, and anxiety.  I reviewed the past medical records in Epic; the patient was admitted to the psychiatric service just last week for increasing anxiety and depression.  She was started on Celexa and hydroxyzine, but states that she may have missed a few doses.  On exam she is anxious appearing but otherwise relatively comfortable.  Her vital signs are normal.  The remainder of the exam is as described above.  The EKG shows some T wave inversions anteriorly which appear to be more due to lead placement and do not suggest ischemia.  Overall the presentation is consistent with anxiety.  However given that she has some wheezing there may be some element of bronchitis as well which could have exacerbated her anxiety when she woke up and was short of breath.  Initial labs including troponin obtained from triage are negative, and her chest x-ray shows no focal infiltrate or other concerning findings.  I will give the patient a dose of her Celexa, as well as an albuterol treatment and reassess.  I anticipate discharge home if her symptoms improve.  ----------------------------------------- 5:46 PM on  06/20/2018 -----------------------------------------  The patient states she is feeling much better.  I will prescribe her an albuterol inhaler.  I counseled her to continue the Celexa.  I gave her thorough return precautions, and she expressed understanding.  She is stable for discharge at this time. ____________________________________________   FINAL CLINICAL IMPRESSION(S) / ED DIAGNOSES  Final diagnoses:  Atypical chest pain  Bronchitis  Anxiety      NEW MEDICATIONS STARTED DURING THIS VISIT:  New Prescriptions   ALBUTEROL (PROVENTIL HFA;VENTOLIN HFA) 108 (90 BASE) MCG/ACT INHALER    Inhale 2 puffs into the lungs every 6 (six) hours as needed for wheezing or shortness of breath.     Note:  This document was  prepared using Dragon voice recognition software and may include unintentional dictation errors.    Arta Silence, MD 06/20/18 1746

## 2018-09-16 LAB — HM HIV SCREENING LAB: HM HIV Screening: NEGATIVE

## 2018-11-04 ENCOUNTER — Encounter: Payer: Self-pay | Admitting: Emergency Medicine

## 2018-11-04 ENCOUNTER — Emergency Department: Payer: Self-pay

## 2018-11-04 ENCOUNTER — Emergency Department
Admission: EM | Admit: 2018-11-04 | Discharge: 2018-11-04 | Discharge status: Against Medical Advice | Payer: Self-pay | Attending: Emergency Medicine | Admitting: Emergency Medicine

## 2018-11-04 ENCOUNTER — Other Ambulatory Visit: Payer: Self-pay

## 2018-11-04 DIAGNOSIS — R059 Cough, unspecified: Secondary | ICD-10-CM

## 2018-11-04 DIAGNOSIS — R0602 Shortness of breath: Secondary | ICD-10-CM

## 2018-11-04 DIAGNOSIS — J040 Acute laryngitis: Secondary | ICD-10-CM | POA: Insufficient documentation

## 2018-11-04 DIAGNOSIS — J060 Acute laryngopharyngitis: Secondary | ICD-10-CM

## 2018-11-04 DIAGNOSIS — R110 Nausea: Secondary | ICD-10-CM

## 2018-11-04 DIAGNOSIS — R05 Cough: Secondary | ICD-10-CM | POA: Insufficient documentation

## 2018-11-04 HISTORY — DX: Depression, unspecified: F32.A

## 2018-11-04 HISTORY — DX: Major depressive disorder, single episode, unspecified: F32.9

## 2018-11-04 NOTE — ED Provider Notes (Addendum)
[facility] Emergency Department Provider Note  ____________________________________________  Time seen: Approximately 9:49 AM  I have reviewed the triage vital signs and the nursing notes.   HISTORY  Chief Complaint Shortness of Breath    HPI Shirley Turner is a 28 y.o. female with a history of seasonal allergies, asthma, anxiety and depression, ongoing tobacco and marijuana use, presenting for sore throat, laryngitis, cough with shortness of breath.  The patient reports that she felt she was having a panic attack this morning but now feels better.  She states that for the past 3 days, she has had intermittent sore throat, currently not having pain.  She also has been having a dry cough and felt short of breath.  She has been working in [employer] and her boss recently had cough and cold symptoms but did return to work.  She has had seasonal allergies in the past and does not know if the symptoms are similar to what she has had in the past.  No fevers.  Positive nausea without vomiting or diarrhea.  Past Medical History:  Diagnosis Date  . Anxiety   . Asthma   . Depression     Patient Active Problem List   Diagnosis Date Noted  . Tobacco use disorder 06/16/2018  . Cocaine use disorder, moderate, dependence ([place]) 06/15/2018  . Cannabis use disorder, moderate, dependence ([place]) 06/15/2018  . 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 Severe recurrent major depression without psychotic features ([place]) 06/14/2018  . History of depression 02/01/2016  . Postpartum care following vaginal delivery 01/30/2016    Past Surgical History:  Procedure Laterality Date  . TONSILLECTOMY AND ADENOIDECTOMY      Current Outpatient Rx  . Order #: [id number] Class: Print  . Order #: [id number] Class: Print  . Order #: [id number] Class: Print  . Order #: [id number] Class: Print    Allergies Patient has no known  allergies.  No family history on file.  Social History Social History   Tobacco Use  . Smoking status: Current Every Day Smoker    Packs/day: 1.00    Types: Cigarettes  . Smokeless tobacco: Never Used  Substance Use Topics  . Alcohol use: Yes    Comment: social   . Drug use: Yes    Types: Marijuana    Review of Systems Constitutional: No fever/chills.  No lightheadedness or syncope.  No diaphoresis. Eyes: No visual changes.  No eye discharge.   ENT: Mild intermittent sore throat. No congestion or rhinorrhea.  No ear pain Cardiovascular: Denies chest pain. Denies palpitations. Respiratory: Positive shortness of breath.  Intermittent cough. Gastrointestinal: No abdominal pain.  Positive nausea, no vomiting.  No diarrhea.  No constipation.   Genitourinary: Negative for dysuria. Musculoskeletal: Negative for back pain. Skin: Negative for rash. Neurological: Negative for headaches. No focal numbness, tingling or weakness.     ____________________________________________   PHYSICAL EXAM:  VITAL SIGNS: ED Triage Vitals  Enc Vitals Group     BP 11/04/18 0930 (!) 134/97     Pulse Rate 11/04/18 0930 71     Resp 11/04/18 0930 20     Temp 11/04/18 0930 98.4 F (36.9 C)     Temp src --      SpO2 11/04/18 0930 100 %     Weight 11/04/18 0931 158 lb (71.7 kg)     Height 11/04/18 0931 5\' 2"  (1.575 m)     Head Circumference --  Peak Flow --      Pain Score 11/04/18 0931 0     Pain Loc --      Pain Edu? --      Excl. in [place]? --     Constitutional: Alert and oriented. Answers questions appropriately. Eyes: Conjunctivae are normal.  EOMI. No scleral icterus. Head: Atraumatic. Nose: No congestion/rhinnorhea. Mouth/Throat: Mucous membranes are moist.  Surgically absent tonsils.  Mild posterior pharyngeal erythema.  The posterior palate is symmetric and the uvula is midline.  No drooling or trismus, the patient does have laryngitis. Neck: No stridor.  Supple.  No  meningismus. Cardiovascular: Normal rate, regular rhythm. No murmurs, rubs or gallops.  Respiratory: Normal respiratory effort.  No accessory muscle use or retractions. Lungs CTAB.  No wheezes, rales or ronchi. Musculoskeletal: Moves all extremities well..  Neurologic:  A&Ox3.  Speech is clear.  Face and smile are symmetric.  EOMI.  Moves all extremities well. Skin:  Skin is warm, dry and intact. No rash noted. Psychiatric: Mood and affect are normal.   ____________________________________________   LABS (all labs ordered are listed, but only abnormal results are displayed)  Labs Reviewed  GROUP A STREP BY PCR   ____________________________________________  EKG  Not indicated ____________________________________________  RADIOLOGY  No results found.  ____________________________________________   PROCEDURES  Procedure(s) performed: None  Procedures  Critical Care performed: No ____________________________________________   INITIAL IMPRESSION / ASSESSMENT AND PLAN / ED COURSE  Pertinent labs & imaging results that were available during my care of the patient were reviewed by me and considered in my medical decision making (see chart for details).  28 y.o. female with a history of asthma and tobacco abuse presenting with several days of sore throat, cough, shortness of breath.  Overall, patient is hemodynamically stable and her O2 sats are 100% on room air.  She has no abnormalities on her cardiopulmonary examination, nor does she have any evidence of respiratory distress.  Given her sore throat, I will do a strep test and we also get a chest x-ray but I anticipate the patient will be safe for discharge home.  I have talked to the patient about home isolation given her symptoms.  Follow-up instructions as well as return precautions were discussed.  ----------------------------------------- 11:00 AM on 11/04/2018 -----------------------------------------  Patient appears  to have left prior to treatment being complete.  Her chest x-ray is reassuring but her strep test needed to be re-drawn and she has not been found in the room or anywhere in the ED.  I have printed her discharge paperwork for the nurse to give them to her if the patient is found and chooses not to have repeat strep testing.  Shirley Turner was evaluated in Emergency Department on 11/04/2018 for the symptoms described in the history of present illness. She was evaluated in the context of the global COVID-19 pandemic, which necessitated consideration that the patient might be at risk for infection with the SARS-CoV-2 virus that causes COVID-19. Institutional protocols and algorithms that pertain to the evaluation of patients at risk for COVID-19 are in a state of rapid change based on information released by regulatory bodies including the CDC and federal and state organizations. These policies and algorithms were followed during the patient's care in the ED.   ____________________________________________  FINAL CLINICAL IMPRESSION(S) / ED DIAGNOSES  Final diagnoses:  Sore throat and laryngitis  SOB (shortness of breath)  Cough  Nausea without vomiting  NEW MEDICATIONS STARTED DURING THIS VISIT:  New Prescriptions   No medications on file      Eula Listen, MD 11/04/18 3200    Eula Listen, MD 11/04/18 1101

## 2018-11-04 NOTE — ED Notes (Signed)
Pt has not returned to room, Dr. Mariea Clonts aware of same, pt d/c instructions printed in case she returns.

## 2018-11-04 NOTE — Discharge Instructions (Signed)
Please drink plenty of fluid to stay well-hydrated.  You may take Tylenol for fever or pain.  Please follow the instructions below for home isolation to prevent the spread of illness.     Person Under Monitoring Name: Shirley Turner  Location: [place] [address]   Infection Prevention Recommendations for Individuals Confirmed to have, or Being Evaluated for, 2019 Novel Coronavirus (COVID-19) Infection Who Receive Care at Home  Individuals who are confirmed to have, or are being evaluated for, COVID-19 should follow the prevention steps below until a healthcare provider or local or state health department says they can return to normal activities.  Stay home except to get medical care You should restrict activities outside your home, except for getting medical care. Do not go to work, school, or public areas, and do not use public transportation or taxis.  Call ahead before visiting your doctor Before your medical appointment, call the healthcare provider and tell them that you have, or are being evaluated for, COVID-19 infection. This will help the healthcare providers office take steps to keep other people from getting infected. Ask your healthcare provider to call the local or state health department.  Monitor your symptoms Seek prompt medical attention if your illness is worsening (e.g., difficulty breathing). Before going to your medical appointment, call the healthcare provider and tell them that you have, or are being evaluated for, COVID-19 infection. Ask your healthcare provider to call the local or state health department.  Wear a facemask You should wear a facemask that covers your nose and mouth when you are in the same room with other people and when you visit a healthcare provider. People who live with or visit you should also wear a facemask while they are in the same room with you.  Separate yourself from other people in your home As much  as possible, you should stay in a different room from other people in your home. Also, you should use a separate bathroom, if available.  Avoid sharing household items You should not share dishes, drinking glasses, cups, eating utensils, towels, bedding, or other items with other people in your home. After using these items, you should wash them thoroughly with soap and water.  Cover your coughs and sneezes Cover your mouth and nose with a tissue when you cough or sneeze, or you can cough or sneeze into your sleeve. Throw used tissues in a lined trash can, and immediately wash your hands with soap and water for at least 20 seconds or use an alcohol-based hand rub.  Wash your [facility] your hands often and thoroughly with soap and water for at least 20 seconds. You can use an alcohol-based hand sanitizer if soap and water are not available and if your hands are not visibly dirty. Avoid touching your eyes, nose, and mouth with unwashed hands.   Prevention Steps for Caregivers and Household Members of Individuals Confirmed to have, or Being Evaluated for, COVID-19 Infection Being Cared for in the Home  If you live with, or provide care at home for, a person confirmed to have, or being evaluated for, COVID-19 infection please follow these guidelines to prevent infection:  Follow healthcare providers instructions Make sure that you understand and can help the patient follow any healthcare provider instructions for all care.  Provide for the patients basic needs You should help the patient with basic needs in the home and provide support for getting groceries, prescriptions, and other personal needs.  Monitor  the patients symptoms If they are getting sicker, call his or her medical provider and tell them that the patient has, or is being evaluated for, COVID-19 infection. This will help the healthcare providers office take steps to keep other people from getting infected. Ask the  healthcare provider to call the local or state health department.  Limit the number of people who have contact with the patient If possible, have only one caregiver for the patient. Other household members should stay in another home or place of residence. If this is not possible, they should stay in another room, or be separated from the patient as much as possible. Use a separate bathroom, if available. Restrict visitors who do not have an essential need to be in the home.  Keep older adults, very young children, and other sick people away from the patient Keep older adults, very young children, and those who have compromised immune systems or chronic health conditions away from the patient. This includes people with chronic heart, lung, or kidney conditions, diabetes, and cancer.  Ensure good ventilation Make sure that shared spaces in the home have good air flow, such as from an air conditioner or an opened window, weather permitting.  Wash your hands often Wash your hands often and thoroughly with soap and water for at least 20 seconds. You can use an alcohol based hand sanitizer if soap and water are not available and if your hands are not visibly dirty. Avoid touching your eyes, nose, and mouth with unwashed hands. Use disposable paper towels to dry your hands. If not available, use dedicated cloth towels and replace them when they become wet.  Wear a facemask and gloves Wear a disposable facemask at all times in the room and gloves when you touch or have contact with the patients blood, body fluids, and/or secretions or excretions, such as sweat, saliva, sputum, nasal mucus, vomit, urine, or feces.  Ensure the mask fits over your nose and mouth tightly, and do not touch it during use. Throw out disposable facemasks and gloves after using them. Do not reuse. Wash your hands immediately after removing your facemask and gloves. If your personal clothing becomes contaminated, carefully  remove clothing and launder. Wash your hands after handling contaminated clothing. Place all used disposable facemasks, gloves, and other waste in a lined container before disposing them with other household waste. Remove gloves and wash your hands immediately after handling these items.  Do not share dishes, glasses, or other household items with the patient Avoid sharing household items. You should not share dishes, drinking glasses, cups, eating utensils, towels, bedding, or other items with a patient who is confirmed to have, or being evaluated for, COVID-19 infection. After the person uses these items, you should wash them thoroughly with soap and water.  Wash laundry thoroughly Immediately remove and wash clothes or bedding that have blood, body fluids, and/or secretions or excretions, such as sweat, saliva, sputum, nasal mucus, vomit, urine, or feces, on them. Wear gloves when handling laundry from the patient. Read and follow directions on labels of laundry or clothing items and detergent. In general, wash and dry with the warmest temperatures recommended on the label.  Clean all areas the individual has used often Clean all touchable surfaces, such as counters, tabletops, doorknobs, bathroom fixtures, toilets, phones, keyboards, tablets, and bedside tables, every day. Also, clean any surfaces that may have blood, body fluids, and/or secretions or excretions on them. Wear gloves when cleaning surfaces the patient has come  in contact with. Use a diluted bleach solution (e.g., dilute bleach with 1 part bleach and 10 parts water) or a household disinfectant with a label that says EPA-registered for coronaviruses. To make a bleach solution at home, add 1 tablespoon of bleach to 1 quart (4 cups) of water. For a larger supply, add  cup of bleach to 1 gallon (16 cups) of water. Read labels of cleaning products and follow recommendations provided on product labels. Labels contain instructions for  safe and effective use of the cleaning product including precautions you should take when applying the product, such as wearing gloves or eye protection and making sure you have good ventilation during use of the product. Remove gloves and wash hands immediately after cleaning.  Monitor yourself for signs and symptoms of illness Caregivers and household members are considered close contacts, should monitor their health, and will be asked to limit movement outside of the home to the extent possible. Follow the monitoring steps for close contacts listed on the symptom monitoring form.   ? If you have additional questions, contact your local health department or call the epidemiologist on call at [phone] (available 24/7). ? This guidance is subject to change. For the most up-to-date guidance from [facility], please refer to their website: YouBlogs.pl

## 2018-11-04 NOTE — ED Notes (Signed)
To room for recollect of strep swab, pt not in room at this time, no personal belongings noted in room, pt not in BR.

## 2018-11-04 NOTE — ED Triage Notes (Addendum)
Patient presents to ED via POV from home with c/o shortness of breath x 3 days. History of asthma and anxiety. Patients voice is hoarse.

## 2018-11-12 ENCOUNTER — Emergency Department: Payer: Self-pay

## 2018-11-12 ENCOUNTER — Other Ambulatory Visit: Payer: Self-pay

## 2018-11-12 ENCOUNTER — Emergency Department
Admission: EM | Admit: 2018-11-12 | Discharge: 2018-11-12 | Disposition: A | Discharge status: Home / Self Care | Payer: Self-pay | Attending: Emergency Medicine | Admitting: Emergency Medicine

## 2018-11-12 DIAGNOSIS — F410 Panic disorder [episodic paroxysmal anxiety] without agoraphobia: Secondary | ICD-10-CM | POA: Insufficient documentation

## 2018-11-12 DIAGNOSIS — F17210 Nicotine dependence, cigarettes, uncomplicated: Secondary | ICD-10-CM | POA: Insufficient documentation

## 2018-11-12 DIAGNOSIS — Z79899 Other long term (current) drug therapy: Secondary | ICD-10-CM | POA: Insufficient documentation

## 2018-11-12 DIAGNOSIS — J45909 Unspecified asthma, uncomplicated: Secondary | ICD-10-CM | POA: Insufficient documentation

## 2018-11-12 DIAGNOSIS — R0602 Shortness of breath: Secondary | ICD-10-CM | POA: Insufficient documentation

## 2018-11-12 LAB — CBC WITH DIFFERENTIAL/PLATELET
Abs Immature Granulocytes: 0.01 10*3/uL (ref 0.00–0.07)
Basophils Absolute: 0 10*3/uL (ref 0.0–0.1)
Basophils Relative: 1 %
Eosinophils Absolute: 0.1 10*3/uL (ref 0.0–0.5)
Eosinophils Relative: 1 %
HCT: 35.9 % — ABNORMAL LOW (ref 36.0–46.0)
Hemoglobin: 11.9 g/dL — ABNORMAL LOW (ref 12.0–15.0)
Immature Granulocytes: 0 %
Lymphocytes Relative: 50 %
Lymphs Abs: 2.8 10*3/uL (ref 0.7–4.0)
MCH: 29.8 pg (ref 26.0–34.0)
MCHC: 33.1 g/dL (ref 30.0–36.0)
MCV: 90 fL (ref 80.0–100.0)
Monocytes Absolute: 0.4 10*3/uL (ref 0.1–1.0)
Monocytes Relative: 7 %
Neutro Abs: 2.3 10*3/uL (ref 1.7–7.7)
Neutrophils Relative %: 41 %
Platelets: 273 10*3/uL (ref 150–400)
RBC: 3.99 MIL/uL (ref 3.87–5.11)
RDW: 14.5 % (ref 11.5–15.5)
WBC: 5.7 10*3/uL (ref 4.0–10.5)
nRBC: 0 % (ref 0.0–0.2)

## 2018-11-12 LAB — BASIC METABOLIC PANEL
Anion gap: 11 (ref 5–15)
BUN: 9 mg/dL (ref 6–20)
CO2: 21 mmol/L — ABNORMAL LOW (ref 22–32)
Calcium: 8.6 mg/dL — ABNORMAL LOW (ref 8.9–10.3)
Chloride: 112 mmol/L — ABNORMAL HIGH (ref 98–111)
Creatinine, Ser: 0.67 mg/dL (ref 0.44–1.00)
GFR calc Af Amer: >60 mL/min (ref >60)
GFR calc non Af Amer: >60 mL/min (ref >60)
Glucose, Bld: 88 mg/dL (ref 70–99)
Potassium: 3.6 mmol/L (ref 3.5–5.1)
Sodium: 144 mmol/L (ref 135–145)

## 2018-11-12 MED ORDER — CITALOPRAM HYDROBROMIDE 20 MG PO TABS: 20.0000 mg | ORAL_TABLET | Freq: Every day | ORAL | 2 refills | Status: DC

## 2018-11-12 MED ORDER — ALBUTEROL SULFATE HFA 108 (90 BASE) MCG/ACT IN AERS: 2.0000 | INHALATION_SPRAY | Freq: Four times a day (QID) | RESPIRATORY_TRACT | 2 refills | Status: DC | PRN

## 2018-11-12 MED ORDER — IPRATROPIUM-ALBUTEROL 0.5-2.5 (3) MG/3ML IN SOLN
3.0000 mL | Freq: Once | RESPIRATORY_TRACT | Status: AC
  Administered 2018-11-12: 3 mL via RESPIRATORY_TRACT
  Filled 2018-11-12: qty 3

## 2018-11-12 MED ORDER — TRAZODONE HCL 100 MG PO TABS: 100.0000 mg | ORAL_TABLET | Freq: Every day | ORAL | 2 refills | Status: DC

## 2018-11-12 MED ORDER — HYDROXYZINE HCL 25 MG PO TABS: 25.0000 mg | ORAL_TABLET | Freq: Three times a day (TID) | ORAL | 0 refills | Status: DC | PRN

## 2018-11-12 NOTE — Discharge Instructions (Addendum)
These do not smoke anything.  The smoke is bad for your lungs and predisposes to coronavirus infection.  Use your inhaler 2 puffs 4 times a day as needed.  I have refilled your Celexa, hydroxyzine and trazodone.  Please use them as directed. Please return as needed.

## 2018-11-12 NOTE — ED Notes (Signed)
Pt presents with episodes of muscle tension and moaning, pt is able to answer questions during "panic attacks". Per pt this is "normal" for her. This RN notified EDP Malinda. EDP Malinda at bedside during an episode of "panic" attack.

## 2018-11-12 NOTE — ED Notes (Signed)
Pt removed pulse ox with a reading of 66% SpO2 on RA. . This RN present in the room to advise pt to not take off pulse ox. This RN  place pulse ox on pt. Pt sating at 100% on RA at this time. This RN will continue to monitor pt. No new orders at this time.

## 2018-11-12 NOTE — ED Notes (Signed)
Per pt, she has a ride home within 30 minutes.

## 2018-11-12 NOTE — ED Provider Notes (Signed)
[facility] Emergency Department Provider Note   ____________________________________________   First MD Initiated Contact with Patient 11/12/18 1213     (approximate)  I have reviewed the triage vital signs and the nursing notes.   HISTORY  Chief Complaint Panic Attack    HPI Shirley Turner is a 28 y.o. female who reports she got very anxious this morning when she was having a panic attack.  She has had them in the past.  She is ran out of her medicine about 3 weeks ago.  She does not have a primary care doctor.  She reports she also has asthma and has some wheezing but does not think she has any fever.  She has a history of tobacco and marijuana use.  Review of her old records show she also uses cocaine.         Past Medical History:  Diagnosis Date  . Anxiety   . Asthma   . Depression     Patient Active Problem List   Diagnosis Date Noted  . Tobacco use disorder 06/16/2018  . Cocaine use disorder, moderate, dependence ([place]) 06/15/2018  . Cannabis use disorder, moderate, dependence ([place]) 06/15/2018  . 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 Severe recurrent major depression without psychotic features ([place]) 06/14/2018  . History of depression 02/01/2016  . Postpartum care following vaginal delivery 01/30/2016    Past Surgical History:  Procedure Laterality Date  . TONSILLECTOMY AND ADENOIDECTOMY      Prior to Admission medications   Medication Sig Start Date End Date Taking? Authorizing Provider  albuterol (PROVENTIL HFA;VENTOLIN HFA) 108 (90 Base) MCG/ACT inhaler Inhale 2 puffs into the lungs every 6 (six) hours as needed for wheezing or shortness of breath. 06/20/18   Arta Silence, MD  albuterol (VENTOLIN HFA) 108 (90 Base) MCG/ACT inhaler Inhale 2 puffs into the lungs every 6 (six) hours as needed for wheezing or shortness of breath. 11/12/18   Nena Polio, MD  citalopram  (CELEXA) 20 MG tablet Take 1 tablet (20 mg total) by mouth daily. 06/17/18   Pucilowska, Herma Ard B, MD  citalopram (CELEXA) 20 MG tablet Take 1 tablet (20 mg total) by mouth daily. 11/12/18 11/12/19  Nena Polio, MD  hydrOXYzine (ATARAX/VISTARIL) 25 MG tablet Take 1 tablet (25 mg total) by mouth 3 (three) times daily as needed for anxiety. 06/16/18   Pucilowska, Herma Ard B, MD  hydrOXYzine (ATARAX/VISTARIL) 25 MG tablet Take 1 tablet (25 mg total) by mouth 3 (three) times daily as needed. 11/12/18   Nena Polio, MD  traZODone (DESYREL) 100 MG tablet Take 1 tablet (100 mg total) by mouth at bedtime as needed for sleep. 06/16/18   Pucilowska, Herma Ard B, MD  traZODone (DESYREL) 100 MG tablet Take 1 tablet (100 mg total) by mouth at bedtime. 11/12/18   Nena Polio, MD    Allergies Patient has no known allergies.  History reviewed. No pertinent family history.  Social History Social History   Tobacco Use  . Smoking status: Current Every Day Smoker    Packs/day: 1.00    Types: Cigarettes  . Smokeless tobacco: Never Used  Substance Use Topics  . Alcohol use: Yes    Comment: social   . Drug use: Yes    Types: Marijuana    Review of Systems  Constitutional: No fever/chills Eyes: No visual changes. ENT: No sore throat. Cardiovascular: Denies chest pain. Respiratory: Slight shortness of breath. Gastrointestinal: No  abdominal pain.  No nausea, no vomiting.  No diarrhea.  No constipation. Genitourinary: Negative for dysuria. Musculoskeletal: Negative for back pain. Skin: Negative for rash. Neurological: Negative for headaches, focal weakness   ____________________________________________   PHYSICAL EXAM:  VITAL SIGNS: ED Triage Vitals  Enc Vitals Group     BP 11/12/18 1208 122/79     Pulse Rate 11/12/18 1208 63     Resp 11/12/18 1208 (!) 22     Temp 11/12/18 1208 99.1 F (37.3 C)     Temp Source 11/12/18 1208 Oral     SpO2 11/12/18 1208 100 %     Weight 11/12/18 1209  156 lb 8.4 oz (71 kg)     Height 11/12/18 1209 5\' 2"  (1.575 m)     Head Circumference --      Peak Flow --      Pain Score 11/12/18 1208 0     Pain Loc --      Pain Edu? --      Excl. in [place]? --     Constitutional: Alert and oriented. Well appearing and in no acute distress. Eyes: Conjunctivae are normal.  Head: Atraumatic. Nose: No congestion/rhinnorhea. Mouth/Throat: Mucous membranes are moist.  Oropharynx non-erythematous. Neck: No stridor.   Cardiovascular: Normal rate, regular rhythm. Grossly normal heart sounds.  Good peripheral circulation. Respiratory: Normal respiratory effort.  No retractions. Lungs scattered wheezes Gastrointestinal: Soft and nontender. No distention. No abdominal bruits. No CVA tenderness. Musculoskeletal: No lower extremity tenderness nor edema.   Neurologic:  Normal speech and language. No gross focal neurologic deficits are appreciated.  Skin:  Skin is warm, dry and intact. No rash noted. Psychiatric: Patient is very anxious  ____________________________________________   LABS (all labs ordered are listed, but only abnormal results are displayed)  Labs Reviewed  BASIC METABOLIC PANEL - Abnormal; Notable for the following components:      Result Value   Chloride 112 (*)    CO2 21 (*)    Calcium 8.6 (*)    All other components within normal limits  CBC WITH DIFFERENTIAL/PLATELET - Abnormal; Notable for the following components:   Hemoglobin 11.9 (*)    HCT 35.9 (*)    All other components within normal limits   ____________________________________________  EKG   ____________________________________________  RADIOLOGY  ED MD interpretation:    Official radiology report(s): Dg Chest Portable 1 View  Result Date: 11/12/2018 CLINICAL DATA:  Acute shortness of breath. EXAM: PORTABLE CHEST 1 VIEW COMPARISON:  11/04/2018 and prior radiographs FINDINGS: The cardiomediastinal silhouette is unremarkable. There is no evidence of focal airspace  disease, pulmonary edema, suspicious pulmonary nodule/mass, pleural effusion, or pneumothorax. No acute bony abnormalities are identified. IMPRESSION: No active disease. Electronically Signed   By: Margarette Canada M.D.   On: 11/12/2018 12:50    ____________________________________________   PROCEDURES  Procedure(s) performed (including Critical Care):  Procedures   ____________________________________________   INITIAL IMPRESSION / ASSESSMENT AND PLAN / ED COURSE  Patient much better we will discharge her home with her meds refilled.              ____________________________________________   FINAL CLINICAL IMPRESSION(S) / ED DIAGNOSES  Final diagnoses:  Panic attack     ED Discharge Orders         Ordered    albuterol (VENTOLIN HFA) 108 (90 Base) MCG/ACT inhaler  Every 6 hours PRN     11/12/18 1231    citalopram (CELEXA) 20 MG tablet  Daily  11/12/18 1231    hydrOXYzine (ATARAX/VISTARIL) 25 MG tablet  3 times daily PRN     11/12/18 1231    traZODone (DESYREL) 100 MG tablet  Daily at bedtime     11/12/18 1231           Note:  This document was prepared using Dragon voice recognition software and may include unintentional dictation errors.    Nena Polio, MD 11/12/18 [phone]

## 2018-11-12 NOTE — ED Triage Notes (Signed)
Pt comes from home via EMS with panic/attack/anxiety/sob. Pt ran out of anxiety meds about 3 weeks ago. Pt is breathing rapidly and tightening her fists and arms. EMS reminding pt to breathe calms pt come. Pt is able to cooperate for vitals and temp. Pt was drinking last night. Pt refusing to speak but is mumbling/making grunting noises while breathing and responding to yes/no with head shakes.

## 2018-11-12 NOTE — ED Notes (Signed)
Patient denies pain and is resting comfortably.  

## 2018-12-03 ENCOUNTER — Emergency Department
Admission: EM | Admit: 2018-12-03 | Discharge: 2018-12-03 | Disposition: A | Discharge status: Home / Self Care | Payer: Self-pay | Attending: Emergency Medicine | Admitting: Emergency Medicine

## 2018-12-03 ENCOUNTER — Other Ambulatory Visit: Payer: Self-pay

## 2018-12-03 DIAGNOSIS — K047 Periapical abscess without sinus: Secondary | ICD-10-CM | POA: Insufficient documentation

## 2018-12-03 DIAGNOSIS — F17210 Nicotine dependence, cigarettes, uncomplicated: Secondary | ICD-10-CM | POA: Insufficient documentation

## 2018-12-03 DIAGNOSIS — J45909 Unspecified asthma, uncomplicated: Secondary | ICD-10-CM | POA: Insufficient documentation

## 2018-12-03 DIAGNOSIS — Z79899 Other long term (current) drug therapy: Secondary | ICD-10-CM | POA: Insufficient documentation

## 2018-12-03 MED ORDER — CLINDAMYCIN HCL 150 MG PO CAPS
300.0000 mg | ORAL_CAPSULE | Freq: Once | ORAL | Status: DC
  Filled 2018-12-03: qty 2

## 2018-12-03 MED ORDER — CLINDAMYCIN HCL 300 MG PO CAPS: 300.0000 mg | ORAL_CAPSULE | Freq: Three times a day (TID) | ORAL | 0 refills | Status: AC

## 2018-12-03 MED ORDER — TRAMADOL HCL 50 MG PO TABS
50.0000 mg | ORAL_TABLET | Freq: Once | ORAL | Status: AC
  Administered 2018-12-03: 50 mg via ORAL
  Filled 2018-12-03: qty 1

## 2018-12-03 MED ORDER — TRAMADOL HCL 50 MG PO TABS: 50.0000 mg | ORAL_TABLET | Freq: Four times a day (QID) | ORAL | 0 refills | Status: DC | PRN

## 2018-12-03 NOTE — ED Provider Notes (Signed)
[facility] Emergency Department Provider Note  Time seen: 8:58 AM  I have reviewed the triage vital signs and the nursing notes.   HISTORY  Chief Complaint Sore Throat    HPI Shirley Turner is a 28 y.o. female with a past medical history of anxiety, depression, asthma, presents to the emergency department for dental pain.  According to the patient for the past 2 days she has been experiencing pain in her left upper molars.  States the pain is worsened and she is now having pain below her jaw on the left side.  Denies any fever cough congestion.  No shortness of breath.  States pain when chewing and swallowing but she is able to do so.    Past Medical History:  Diagnosis Date  . Anxiety   . Asthma   . Depression     Patient Active Problem List   Diagnosis Date Noted  . Tobacco use disorder 06/16/2018  . Cocaine use disorder, moderate, dependence ([place]) 06/15/2018  . Cannabis use disorder, moderate, dependence ([place]) 06/15/2018  . 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 Severe recurrent major depression without psychotic features ([place]) 06/14/2018  . History of depression 02/01/2016  . Postpartum care following vaginal delivery 01/30/2016    Past Surgical History:  Procedure Laterality Date  . TONSILLECTOMY AND ADENOIDECTOMY      Prior to Admission medications   Medication Sig Start Date End Date Taking? Authorizing Provider  albuterol (PROVENTIL HFA;VENTOLIN HFA) 108 (90 Base) MCG/ACT inhaler Inhale 2 puffs into the lungs every 6 (six) hours as needed for wheezing or shortness of breath. 06/20/18   Arta Silence, MD  albuterol (VENTOLIN HFA) 108 (90 Base) MCG/ACT inhaler Inhale 2 puffs into the lungs every 6 (six) hours as needed for wheezing or shortness of breath. 11/12/18   Nena Polio, MD  citalopram (CELEXA) 20 MG tablet Take 1 tablet (20 mg total) by mouth daily. 06/17/18   Pucilowska,  Herma Ard B, MD  citalopram (CELEXA) 20 MG tablet Take 1 tablet (20 mg total) by mouth daily. 11/12/18 11/12/19  Nena Polio, MD  hydrOXYzine (ATARAX/VISTARIL) 25 MG tablet Take 1 tablet (25 mg total) by mouth 3 (three) times daily as needed for anxiety. 06/16/18   Pucilowska, Herma Ard B, MD  hydrOXYzine (ATARAX/VISTARIL) 25 MG tablet Take 1 tablet (25 mg total) by mouth 3 (three) times daily as needed. 11/12/18   Nena Polio, MD  traZODone (DESYREL) 100 MG tablet Take 1 tablet (100 mg total) by mouth at bedtime as needed for sleep. 06/16/18   Pucilowska, Herma Ard B, MD  traZODone (DESYREL) 100 MG tablet Take 1 tablet (100 mg total) by mouth at bedtime. 11/12/18   Nena Polio, MD    No Known Allergies  No family history on file.  Social History Social History   Tobacco Use  . Smoking status: Current Every Day Smoker    Packs/day: 1.00    Types: Cigarettes  . Smokeless tobacco: Never Used  Substance Use Topics  . Alcohol use: Yes    Comment: social   . Drug use: Yes    Types: Marijuana    Review of Systems Constitutional: Negative for fever. ENT: Left upper dental pain.  No sore throat. Cardiovascular: Negative for chest pain. Respiratory: Negative for shortness of breath. Gastrointestinal: Negative for abdominal pain Musculoskeletal: Negative for musculoskeletal complaints Neurological: Negative for headache All other ROS negative  ____________________________________________   PHYSICAL EXAM:  VITAL SIGNS: ED Triage Vitals  Enc Vitals Group     BP 12/03/18 0842 113/73     Pulse Rate 12/03/18 0842 (!) 59     Resp 12/03/18 0842 15     Temp 12/03/18 0842 98.6 F (37 C)     Temp Source 12/03/18 0842 Oral     SpO2 12/03/18 0842 98 %     Weight 12/03/18 0839 168 lb (76.2 kg)     Height 12/03/18 0839 5\' 2"  (1.575 m)     Head Circumference --      Peak Flow --      Pain Score 12/03/18 0839 10     Pain Loc --      Pain Edu? --      Excl. in [place]? --     Constitutional: Alert and oriented. Well appearing and in no distress. Eyes: Normal exam ENT      Head: Normocephalic and atraumatic.      Mouth/Throat: Mucous membranes are moist.  Tenderness along the left upper molar/wisdom tooth.  No obvious abscess.  Mild left cervical lymphadenopathy.  Oropharynx appears normal, widely patent. Cardiovascular: Normal rate, regular rhythm. No murmur Respiratory: Normal respiratory effort without tachypnea nor retractions. Breath sounds are clear Gastrointestinal: Soft and nontender. No distention.   Musculoskeletal: Nontender with normal range of motion in all extremities.  Neurologic:  Normal speech and language. No gross focal neurologic deficits Skin:  Skin is warm, dry and intact.  Psychiatric: Mood and affect are normal.   ____________________________________________   INITIAL IMPRESSION / ASSESSMENT AND PLAN / ED COURSE  Pertinent labs & imaging results that were available during my care of the patient were reviewed by me and considered in my medical decision making (see chart for details).   Patient presents emergency department for 2 days of left upper molar pain.  Worse with chewing or attempting to swallow.  Patient denies any fever cough congestion or shortness of breath.  On exam patient appears to have dental infection, no obvious abscess.  No facial edema or erythema.  Mild left cervical lymphadenopathy.  We will place patient on clindamycin and have the patient follow-up with a dentist.  I discussed return precautions.  Patient agreeable to plan of care.  Shirley Turner was evaluated in Emergency Department on 12/03/2018 for the symptoms described in the history of present illness. She was evaluated in the context of the global COVID-19 pandemic, which necessitated consideration that the patient might be at risk for infection with the SARS-CoV-2 virus that causes COVID-19. Institutional protocols and algorithms that pertain to the  evaluation of patients at risk for COVID-19 are in a state of rapid change based on information released by regulatory bodies including the CDC and federal and state organizations. These policies and algorithms were followed during the patient's care in the ED.  ____________________________________________   FINAL CLINICAL IMPRESSION(S) / ED DIAGNOSES  Dental infection   Harvest Dark, MD 12/03/18 [phone]

## 2018-12-03 NOTE — ED Triage Notes (Signed)
Pt c/o left sided sore throat since yesterday and states worse today with muffled voice.

## 2018-12-03 NOTE — Discharge Instructions (Addendum)
OPTIONS FOR DENTAL FOLLOW UP CARE ° °[place] Department of Health and Human Services - Local Safety Net Dental Clinics °http://www.ncdhhs.gov/dph/oralhealth/services/safetynetclinics.htm °  °[facility] (336-562-3123) ° °[facility] [place] ([phone]) ° °[facility] ([phone] ext [phone]) ° °[facility] ([phone]) ° °[facility] ([phone]) °This clinic caters to the indigent population and is on a lottery system. °Location: °[facility], [address], [place] °Clinic Hours: °Wednesdays from 6pm - 9pm, patients seen by a lottery system. °For dates, call or go to [email or web address] °Services: °Cleanings, fillings and simple extractions. °Payment Options: °DENTAL WORK IS FREE OF CHARGE. Bring proof of income or support. °Best way to get seen: °Arrive at 5:15 pm - this is a lottery, NOT first come/first serve, so arriving earlier will not increase your chances of being seen. °  °  °[facility] °[phone] °Select option 1 for emergencies °  °Location: °[facility], [address], [place] °Clinic Hours: °No walk-ins accepted - call the day before to schedule an appointment. °Check in times are 9:30 am and 1:30 pm. °Services: °Simple extractions, temporary fillings, pulpectomy/pulp debridement, uncomplicated abscess drainage. °Payment Options: °PAYMENT IS DUE AT THE TIME OF SERVICE.  Fee is usually $100-200, additional surgical procedures (e.g. abscess drainage) may be extra. °Cash, checks, Visa/MasterCard accepted.  Can file Medicaid if patient is covered for dental - patient should call case worker to check. °No discount for [facility] patients. °Best way to get seen: °MUST call the day before and get onto the schedule. Can usually be seen the next 1-2 days. No walk-ins accepted. °  °  °[facility] °[phone] °   °Location: °[facility], [address], [place] °Clinic Hours: °M, W, Th, F 8am or 1:30pm, Tues 9a or 1:30 - first come/first served. °Services: °Simple extractions, temporary fillings, uncomplicated abscess drainage.  You do not need to be an [place] resident. °Payment Options: °PAYMENT IS DUE AT THE TIME OF SERVICE. °Dental insurance, otherwise sliding scale - bring proof of income or support. °Depending on income and treatment needed, cost is usually $50-200. °Best way to get seen: °Arrive early as it is first come/first served. °  °  °[facility] °[phone] °  °Location: °[address] °Clinic Hours: °Mon-Thu 8a-5p °Services: °Most basic dental services including extractions and fillings. °Payment Options: °PAYMENT IS DUE AT THE TIME OF SERVICE. °Sliding scale, up to 50% off - bring proof if income or support. °Medicaid with dental option accepted. °Best way to get seen: °Call to schedule an appointment, can usually be seen within 2 weeks OR they will try to see walk-ins - show up at 8a or 2p (you may have to wait). °  °  °[facility] °[phone] °[place] RESIDENTS ONLY °  °Location: °[facility], [address], [place], [place] [address] °Clinic Hours: By appointment only. °Monday - Thursday 8am-5pm, Friday 8am-12pm °Services: Cleanings, fillings, extractions. °Payment Options: °PAYMENT IS DUE AT THE TIME OF SERVICE. °Cash, Visa or MasterCard. Sliding scale - $30 minimum per service. °Best way to get seen: °Come in to office, complete packet and make an appointment - need proof of income °or support monies for each household member and proof of [place] residence. °Usually takes about a month to get in. °  °  °[facility] °[phone] °  °Location: °[address]   [place] °Clinic Hours: Walk-in Urgent Care Dental Services are offered Monday-Friday  mornings only. °The numbers of emergencies accepted daily is limited to the number of °providers available. °Maximum 15 - Mondays, Wednesdays & Thursdays °Maximum 10 - Tuesdays & Fridays °Services: °You do not need to be a [place] resident to be seen for a dental emergency. °Emergencies are defined as pain, swelling, abnormal bleeding, or dental trauma. Walkins will receive x-rays if needed. °NOTE: Dental cleaning is not an emergency. °Payment Options: °PAYMENT IS DUE AT THE TIME OF SERVICE. °Minimum co-pay is $40.00 for uninsured patients. °Minimum co-pay is $3.00 for Medicaid with dental coverage. °Dental Insurance is accepted and must be presented at time of visit. °Medicare does not cover dental. °Forms of payment: Cash, credit card, checks. °Best way to get seen: °If not previously registered with the clinic, walk-in dental registration begins at 7:15 am and is on a first come/first serve basis. °If previously registered with the clinic, call to make an appointment. °  °  °[facility] °[phone] °[place] RESIDENTS ONLY °  °Location: °[address], [place], [place] °Clinic Hours: °Mon-Thu 10a-2p °Services: Extractions only! °Payment Options: °FREE (donations accepted) - bring proof of income or support °Best way to get seen: °Call and schedule an appointment OR come at 8am on the 1st Monday of every month (except for holidays) when it is first come/first served. °  °  °[facility] °[phone] °  °Location: °[address], [place] °Clinic Hours: °Friday mornings °Services, Payment Options, Best way to get seen: °Call for info °

## 2018-12-03 NOTE — ED Notes (Signed)
Pt request Pain medication given prior to d/c. MD made aware. See mar

## 2019-02-28 ENCOUNTER — Emergency Department
Admission: EM | Admit: 2019-02-28 | Discharge: 2019-02-28 | Disposition: A | Discharge status: Home / Self Care | Payer: Self-pay | Attending: Emergency Medicine | Admitting: Emergency Medicine

## 2019-02-28 ENCOUNTER — Other Ambulatory Visit: Payer: Self-pay

## 2019-02-28 ENCOUNTER — Encounter: Payer: Self-pay | Admitting: Emergency Medicine

## 2019-02-28 DIAGNOSIS — F419 Anxiety disorder, unspecified: Secondary | ICD-10-CM | POA: Insufficient documentation

## 2019-02-28 DIAGNOSIS — Z79899 Other long term (current) drug therapy: Secondary | ICD-10-CM | POA: Insufficient documentation

## 2019-02-28 DIAGNOSIS — F329 Major depressive disorder, single episode, unspecified: Secondary | ICD-10-CM | POA: Insufficient documentation

## 2019-02-28 DIAGNOSIS — J45909 Unspecified asthma, uncomplicated: Secondary | ICD-10-CM | POA: Insufficient documentation

## 2019-02-28 DIAGNOSIS — F4323 Adjustment disorder with mixed anxiety and depressed mood: Secondary | ICD-10-CM | POA: Insufficient documentation

## 2019-02-28 DIAGNOSIS — F17210 Nicotine dependence, cigarettes, uncomplicated: Secondary | ICD-10-CM | POA: Insufficient documentation

## 2019-02-28 LAB — CBC WITH DIFFERENTIAL/PLATELET
Abs Immature Granulocytes: 0.02 10*3/uL (ref 0.00–0.07)
Basophils Absolute: 0 10*3/uL (ref 0.0–0.1)
Basophils Relative: 0 %
Eosinophils Absolute: 0.1 10*3/uL (ref 0.0–0.5)
Eosinophils Relative: 1 %
HCT: 35.1 % — ABNORMAL LOW (ref 36.0–46.0)
Hemoglobin: 11.9 g/dL — ABNORMAL LOW (ref 12.0–15.0)
Immature Granulocytes: 0 %
Lymphocytes Relative: 37 %
Lymphs Abs: 2.9 10*3/uL (ref 0.7–4.0)
MCH: 30.4 pg (ref 26.0–34.0)
MCHC: 33.9 g/dL (ref 30.0–36.0)
MCV: 89.5 fL (ref 80.0–100.0)
Monocytes Absolute: 0.4 10*3/uL (ref 0.1–1.0)
Monocytes Relative: 6 %
Neutro Abs: 4.3 10*3/uL (ref 1.7–7.7)
Neutrophils Relative %: 56 %
Platelets: 282 10*3/uL (ref 150–400)
RBC: 3.92 MIL/uL (ref 3.87–5.11)
RDW: 13.9 % (ref 11.5–15.5)
WBC: 7.8 10*3/uL (ref 4.0–10.5)
nRBC: 0 % (ref 0.0–0.2)

## 2019-02-28 LAB — COMPREHENSIVE METABOLIC PANEL
ALT: 17 U/L (ref 0–44)
AST: 19 U/L (ref 15–41)
Albumin: 3.7 g/dL (ref 3.5–5.0)
Alkaline Phosphatase: 55 U/L (ref 38–126)
Anion gap: 10 (ref 5–15)
BUN: 9 mg/dL (ref 6–20)
CO2: 22 mmol/L (ref 22–32)
Calcium: 8.4 mg/dL — ABNORMAL LOW (ref 8.9–10.3)
Chloride: 110 mmol/L (ref 98–111)
Creatinine, Ser: 0.75 mg/dL (ref 0.44–1.00)
GFR calc Af Amer: >60 mL/min (ref >60)
GFR calc non Af Amer: >60 mL/min (ref >60)
Glucose, Bld: 82 mg/dL (ref 70–99)
Potassium: 3.4 mmol/L — ABNORMAL LOW (ref 3.5–5.1)
Sodium: 142 mmol/L (ref 135–145)
Total Bilirubin: 0.5 mg/dL (ref 0.3–1.2)
Total Protein: 7.1 g/dL (ref 6.5–8.1)

## 2019-02-28 LAB — ETHANOL: Alcohol, Ethyl (B): <10 mg/dL (ref <10)

## 2019-02-28 MED ORDER — GABAPENTIN 100 MG PO CAPS: 100.0000 mg | ORAL_CAPSULE | Freq: Three times a day (TID) | ORAL | 2 refills | Status: DC

## 2019-02-28 MED ORDER — LORAZEPAM 1 MG PO TABS
1.0000 mg | ORAL_TABLET | Freq: Once | ORAL | Status: AC
  Administered 2019-02-28: 1 mg via ORAL
  Filled 2019-02-28: qty 1

## 2019-02-28 MED ORDER — GABAPENTIN 100 MG PO CAPS
200.0000 mg | ORAL_CAPSULE | Freq: Once | ORAL | Status: DC
  Filled 2019-02-28: qty 2

## 2019-02-28 MED ORDER — GABAPENTIN 100 MG PO CAPS: 100.0000 mg | ORAL_CAPSULE | Freq: Three times a day (TID) | ORAL | Status: DC

## 2019-02-28 MED ORDER — GABAPENTIN 300 MG PO CAPS
300.0000 mg | ORAL_CAPSULE | Freq: Three times a day (TID) | ORAL | Status: DC
  Administered 2019-02-28: 300 mg via ORAL
  Filled 2019-02-28: qty 1
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
## 2019-02-28 NOTE — BH Assessment (Signed)
Psych NP requested TTS provide contact information for local resources.

## 2019-02-28 NOTE — ED Notes (Signed)
Pt given warm blanket.

## 2019-02-28 NOTE — ED Notes (Signed)
Psychiatrist at bedside

## 2019-02-28 NOTE — BH Assessment (Signed)
[facility], [place] [address] [phone]  [facility] offers detoxification and crisis services ----- [place] [email or web address] [place], [place] [address] [phone]  [facility] offers outpatient therapy, psychiatric care, intensive in-home services, peer support services, Substance Abuse Intensive Outpatient Services (SAIOP) and Conservation officer, nature (CST) as well as intellectual developmental disability specialty services.  ----- [facility], [place], [place] [address] [place] offers comprehensive clinical assessment, psychological evaluation, medication management, substance abuse intensive outpatient (SAIOP), intensive in-home therapy (IIH), and community support team (CST), and suboxone maintenance therapy.  --- National Suicide Prevention Lifeline (904)033-7329 The Lifeline provides 24/7; free and confidential support for people in distress, prevention and crisis resources for you or your loved ones, and best practices for professionals -----  [facility] Mobile Crisis Line [phone] This is a 24/7 phone number for crisis evaluation.

## 2019-02-28 NOTE — Discharge Instructions (Addendum)
[facility] [place], [place] [address] [phone]  [facility] offers detoxification and crisis services ----- [place] [email or web address] [place], [place] [address] [phone]  [facility] offers outpatient therapy, psychiatric care, intensive in-home services, peer support services, Substance Abuse Intensive Outpatient Services (SAIOP) and Conservation officer, nature (CST) as well as intellectual developmental disability specialty services.  ----- [facility], [place], [place] [address] [place] offers comprehensive clinical assessment, psychological evaluation, medication management, substance abuse intensive outpatient (SAIOP), intensive in-home therapy (IIH), and community support team (CST), and suboxone maintenance therapy.  --- National Suicide Prevention Lifeline 980-377-0658 The Lifeline provides 24/7; free and confidential support for people in distress, prevention and crisis resources for you or your loved ones, and best practices for professionals -----  [facility] Mobile Crisis Line [phone] This is a 24/7 phone number for crisis evaluation.
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
## 2019-02-28 NOTE — Consult Note (Signed)
[facility] Psych ED Discharge  02/28/2019 3:46 PM Shirley Turner  MRN:  [id number] Principal Problem: Adjustment disorder with mixed anxiety and depressed mood Discharge Diagnoses: Principal Problem:   Adjustment disorder with mixed anxiety and depressed mood  Subjective: "I got upset."  HPI:  On admission to the ED, she was brought here by EMS after drinking last night and skipping her anxiety medication.  EMS was called by neighbors because she stumbled out of her house, fell to the ground, and started screaming.  Ativan given on the arrival of the ED.  She started drinking two days ago as one of her friends was killed in a car accident.  This was upsetting to her along with her mother and father moving in with her along with her cousin three  Months ago which she thought was for a few days.  She likes to be alone and does not want them there but will not tell them to leave because "I'm not a mean person."  Denies suicidal/homicidal ideations, hallucinations, or withdrawal symptoms.  Reports using cannabis for anxiety, politely explained the research reporting it increasing anxiety.  Discussed medications and side effects and agreed to gabapentin.  Depression is situational at this time.  Calm and cooperative on assessment, engaged easily in conversation.  Agreeable to return to [facility] as she did an evaluation there prior to [place] but did not return.  Stable psychiatrically for discharge.  Total Time spent with patient: 1 hour  Past Psychiatric History: depression, anxiety, substance abuse  Past Medical History:  Past Medical History:  Diagnosis Date  . Anxiety   . Asthma   . Depression     Past Surgical History:  Procedure Laterality Date  . TONSILLECTOMY AND ADENOIDECTOMY     Family History: History reviewed. No pertinent family history. Family Psychiatric  History: none Social History:  Social History   Substance and Sexual Activity  Alcohol Use Yes   Comment: social      Social  History   Substance and Sexual Activity  Drug Use Yes  . Types: Marijuana    Social History   Socioeconomic History  . Marital status: Single    Spouse name: Not on file  . Number of children: Not on file  . Years of education: Not on file  . Highest education level: Not on file  Occupational History  . Not on file  Social Needs  . Financial resource strain: Not on file  . Food insecurity    Worry: Not on file    Inability: Not on file  . Transportation needs    Medical: Not on file    Non-medical: Not on file  Tobacco Use  . Smoking status: Current Every Day Smoker    Packs/day: 1.00    Types: Cigarettes  . Smokeless tobacco: Never Used  Substance and Sexual Activity  . Alcohol use: Yes    Comment: social   . Drug use: Yes    Types: Marijuana  . Sexual activity: Not on file  Lifestyle  . Physical activity    Days per week: Not on file    Minutes per session: Not on file  . Stress: Not on file  Relationships  . Social Herbalist on phone: Not on file    Gets together: Not on file    Attends religious service: Not on file    Active member of club or organization: Not on file    Attends meetings of clubs or  organizations: Not on file    Relationship status: Not on file  Other Topics Concern  . Not on file  Social History Narrative  . Not on file    Has this patient used any form of tobacco in the last 30 days? (Cigarettes, Smokeless Tobacco, Cigars, and/or Pipes) Prescription not provided due to an Allergy to all of the FDA-approved tobacco cessation medications  Current Medications: Current Facility-Administered Medications  Medication Dose Route Frequency Provider Last Rate Last Dose  . [START ON 03/01/2019] gabapentin (NEURONTIN) capsule 100 mg  100 mg Oral TID Patrecia Pour, NP      . gabapentin (NEURONTIN) capsule 200 mg  200 mg Oral Once Patrecia Pour, NP       Current Outpatient Medications  Medication Sig Dispense Refill  . albuterol  (PROVENTIL HFA;VENTOLIN HFA) 108 (90 Base) MCG/ACT inhaler Inhale 2 puffs into the lungs every 6 (six) hours as needed for wheezing or shortness of breath. 1 Inhaler 0  . albuterol (VENTOLIN HFA) 108 (90 Base) MCG/ACT inhaler Inhale 2 puffs into the lungs every 6 (six) hours as needed for wheezing or shortness of breath. 1 Inhaler 2  . citalopram (CELEXA) 20 MG tablet Take 1 tablet (20 mg total) by mouth daily. 30 tablet 1  . citalopram (CELEXA) 20 MG tablet Take 1 tablet (20 mg total) by mouth daily. 30 tablet 2  . hydrOXYzine (ATARAX/VISTARIL) 25 MG tablet Take 1 tablet (25 mg total) by mouth 3 (three) times daily as needed for anxiety. 90 tablet 1  . hydrOXYzine (ATARAX/VISTARIL) 25 MG tablet Take 1 tablet (25 mg total) by mouth 3 (three) times daily as needed. 30 tablet 0  . traMADol (ULTRAM) 50 MG tablet Take 1 tablet (50 mg total) by mouth every 6 (six) hours as needed. 10 tablet 0  . traZODone (DESYREL) 100 MG tablet Take 1 tablet (100 mg total) by mouth at bedtime as needed for sleep. 30 tablet 1  . traZODone (DESYREL) 100 MG tablet Take 1 tablet (100 mg total) by mouth at bedtime. 30 tablet 2   PTA Medications: (Not in a hospital admission)   Musculoskeletal: Strength & Muscle Tone: within normal limits Gait & Station: normal Patient leans: N/A  Psychiatric Specialty Exam: Physical Exam  Nursing note and vitals reviewed. Constitutional: She is oriented to person, place, and time. She appears well-developed and well-nourished.  HENT:  Head: Normocephalic.  Neck: Normal range of motion.  Respiratory: Effort normal.  Musculoskeletal: Normal range of motion.  Neurological: She is alert and oriented to person, place, and time.  Psychiatric: Her speech is normal and behavior is normal. Judgment and thought content normal. Her mood appears anxious. Cognition and memory are normal. She exhibits a depressed mood.    Review of Systems  Psychiatric/Behavioral: Positive for depression  and substance abuse. The patient is nervous/anxious.   All other systems reviewed and are negative.   Blood pressure (!) 124/92, pulse (!) 58, temperature 98.8 F (37.1 C), temperature source Oral, resp. rate 20, height 5\' 3"  (1.6 m), weight 69.9 kg, SpO2 100 %, unknown if currently breastfeeding.Body mass index is 27.28 kg/m.  General Appearance: Disheveled  Eye Contact:  Good  Speech:  Normal Rate  Volume:  Normal  Mood:  Anxious and Depressedmild   Affect:  Congruent  Thought Process:  Coherent and Descriptions of Associations: Intact  Orientation:  Full (Time, Place, and Person)  Thought Content:  WDL and Logical  Suicidal Thoughts:  No  Homicidal Thoughts:  No  Memory:  Immediate;   Good Recent;   Good Remote;   Good  Judgement:  Fair  Insight:  Fair  Psychomotor Activity:  Normal  Concentration:  Concentration: Good and Attention Span: Good  Recall:  Good  Fund of Knowledge:  Good  Language:  Good  Akathisia:  No  Handed:  Right  AIMS (if indicated):     Assets:  Housing Leisure Time Physical Health Resilience Social Support  ADL's:  Intact  Cognition:  WNL  Sleep:        Demographic Factors:  Adolescent or young adult  Loss Factors: NA  Historical Factors: NA  Risk Reduction Factors:   Sense of responsibility to family, Living with another person, especially a relative and Positive social support  Continued Clinical Symptoms:  Depression and anxiety, mild  Cognitive Features That Contribute To Risk:  None    Suicide Risk:  Minimal: No identifiable suicidal ideation.  Patients presenting with no risk factors but with morbid ruminations; may be classified as minimal risk based on the severity of the depressive symptoms   Plan Of Care/Follow-up recommendations:  Adjustment disorder with mixed anxiety and depressed mood -Continue Celexa 20 mg daily -Started gabapentin 100 mg TID  -One time dose of gabapentin 300 mg  -Resources for  [facility]  Activity:  as tolerated Diet:  heart healthy diet  Disposition: discharge home Waylan Boga, NP 02/28/2019, 3:46 PM

## 2019-02-28 NOTE — ED Notes (Signed)
Patient reporting "chest pains" at this time

## 2019-02-28 NOTE — ED Notes (Signed)
Patient moaning, groaning, moving all around in bed and yelling out in her bed "oh my god"

## 2019-02-28 NOTE — ED Notes (Signed)
Patient has all belongings with her, voiced understanding of d/c instructions. Patient used hospital phone to call ride.

## 2019-04-10 ENCOUNTER — Other Ambulatory Visit: Payer: Self-pay

## 2019-04-10 ENCOUNTER — Emergency Department
Admission: EM | Admit: 2019-04-10 | Discharge: 2019-04-10 | Disposition: A | Discharge status: Home / Self Care | Payer: Self-pay | Attending: Emergency Medicine | Admitting: Emergency Medicine

## 2019-04-10 DIAGNOSIS — F419 Anxiety disorder, unspecified: Secondary | ICD-10-CM | POA: Insufficient documentation

## 2019-04-10 DIAGNOSIS — F410 Panic disorder [episodic paroxysmal anxiety] without agoraphobia: Secondary | ICD-10-CM | POA: Insufficient documentation

## 2019-04-10 DIAGNOSIS — F17210 Nicotine dependence, cigarettes, uncomplicated: Secondary | ICD-10-CM | POA: Insufficient documentation

## 2019-04-10 DIAGNOSIS — J45909 Unspecified asthma, uncomplicated: Secondary | ICD-10-CM | POA: Insufficient documentation

## 2019-04-10 LAB — CBC WITH DIFFERENTIAL/PLATELET
Abs Immature Granulocytes: 0.02 10*3/uL (ref 0.00–0.07)
Basophils Absolute: 0.1 10*3/uL (ref 0.0–0.1)
Basophils Relative: 1 %
Eosinophils Absolute: 0.1 10*3/uL (ref 0.0–0.5)
Eosinophils Relative: 1 %
HCT: 36.1 % (ref 36.0–46.0)
Hemoglobin: 12.3 g/dL (ref 12.0–15.0)
Immature Granulocytes: 0 %
Lymphocytes Relative: 38 %
Lymphs Abs: 2.9 10*3/uL (ref 0.7–4.0)
MCH: 30.5 pg (ref 26.0–34.0)
MCHC: 34.1 g/dL (ref 30.0–36.0)
MCV: 89.6 fL (ref 80.0–100.0)
Monocytes Absolute: 0.4 10*3/uL (ref 0.1–1.0)
Monocytes Relative: 6 %
Neutro Abs: 4 10*3/uL (ref 1.7–7.7)
Neutrophils Relative %: 54 %
Platelets: 268 10*3/uL (ref 150–400)
RBC: 4.03 MIL/uL (ref 3.87–5.11)
RDW: 13.7 % (ref 11.5–15.5)
WBC: 7.4 10*3/uL (ref 4.0–10.5)
nRBC: 0 % (ref 0.0–0.2)

## 2019-04-10 LAB — BASIC METABOLIC PANEL
Anion gap: 9 (ref 5–15)
BUN: 9 mg/dL (ref 6–20)
CO2: 24 mmol/L (ref 22–32)
Calcium: 8.5 mg/dL — ABNORMAL LOW (ref 8.9–10.3)
Chloride: 107 mmol/L (ref 98–111)
Creatinine, Ser: 0.94 mg/dL (ref 0.44–1.00)
GFR calc Af Amer: >60 mL/min (ref >60)
GFR calc non Af Amer: >60 mL/min (ref >60)
Glucose, Bld: 75 mg/dL (ref 70–99)
Potassium: 3.3 mmol/L — ABNORMAL LOW (ref 3.5–5.1)
Sodium: 140 mmol/L (ref 135–145)

## 2019-04-10 LAB — URINALYSIS, COMPLETE (UACMP) WITH MICROSCOPIC
Bacteria, UA: NONE SEEN
Bilirubin Urine: NEGATIVE
Glucose, UA: NEGATIVE mg/dL
Ketones, ur: 80 mg/dL — AB
Nitrite: NEGATIVE
Protein, ur: 100 mg/dL — AB
RBC / HPF: >50 RBC/hpf — ABNORMAL HIGH (ref 0–5)
Specific Gravity, Urine: 1.026 (ref 1.005–1.030)
pH: 6 (ref 5.0–8.0)

## 2019-04-10 LAB — POCT PREGNANCY, URINE: Preg Test, Ur: NEGATIVE

## 2019-04-10 MED ORDER — LORAZEPAM 0.5 MG PO TABS
0.5000 mg | ORAL_TABLET | Freq: Once | ORAL | Status: AC
  Administered 2019-04-10: 17:00:00 0.5 mg via ORAL
  Filled 2019-04-10: qty 1

## 2019-04-10 MED ORDER — ACETAMINOPHEN 500 MG PO TABS
1000.0000 mg | ORAL_TABLET | Freq: Once | ORAL | Status: AC
  Administered 2019-04-10: 1000 mg via ORAL
  Filled 2019-04-10: qty 2

## 2019-04-10 MED ORDER — POTASSIUM CHLORIDE 20 MEQ/15ML (10%) PO SOLN
20.0000 meq | Freq: Once | ORAL | Status: AC
  Administered 2019-04-10: 20 meq via ORAL
  Filled 2019-04-10: qty 15

## 2019-04-10 NOTE — ED Notes (Signed)
Patient is lying in 19h, bed, she is calm and cooperative, awaiting to be seen by medical, Nurse will continue to monitor.

## 2019-04-10 NOTE — ED Notes (Signed)
Patient assisted per w/c to bathroom, states that she can not walk, her legs are weak and numb from having anxiety, patient moving very slowly, nurse did give her a pad due to her time of the month, will continue to monitor.

## 2019-04-10 NOTE — ED Notes (Signed)
Patient is sleeping at this time, no signs of distress.

## 2019-04-10 NOTE — ED Notes (Signed)
Patient voices understanding of discharge instructions, all belongings with her.

## 2019-04-10 NOTE — ED Triage Notes (Signed)
Brought by ems.  Called  For breathing problems.  Also called last night.  Crying and rolling on bed and hyperventilation.

## 2019-04-10 NOTE — ED Triage Notes (Signed)
Pt comes into the ED via EMS from home, pt is hyperventilating , and has to be asked multiple times why she is here today. Pt states it is her anxiety, states she has been out of her citalopram for a couple of weeks. Pt will start to hyperventilate and shake all over but is responsive the who time talking and making eye contact.

## 2019-04-10 NOTE — ED Notes (Signed)
Patient assisted to use restroom. Able to stand and pivot without difficulty. Patient sitting in wheelchair in lobby. Given ice water to drink.

## 2019-04-10 NOTE — ED Provider Notes (Signed)
[facility] Emergency Department Provider Note   ____________________________________________   First MD Initiated Contact with Patient 04/10/19 1502     (approximate)  I have reviewed the triage vital signs and the nursing notes.   HISTORY  Chief Complaint Anxiety    HPI Shirley Turner is a 28 y.o. female with past medical history of depression and anxiety presents to the ED complaining of anxiety.  Patient reports she has been stressed due to multiple issues at home with family.  She states "my anxiety got to me" and she was having heavy breathing and difficulty calming herself down, similar to prior panic attacks.  She does states she has been dealing with a headache since this morning, states it is gradually gotten worse and denies any numbness or weakness.  She has not had any fevers or neck stiffness.  She does state that she recently stopped her citalopram 2 weeks ago and has been doing worse since then.  She denies any SI or HI, also denies any auditory or visual hallucinations.        Past Medical History:  Diagnosis Date  . Anxiety   . Asthma   . Depression     Patient Active Problem List   Diagnosis Date Noted  . Adjustment disorder with mixed anxiety and depressed mood 02/28/2019  . Tobacco use disorder 06/16/2018  . Cocaine use disorder, moderate, dependence ([place]) 06/15/2018  . Cannabis use disorder, moderate, dependence ([place]) 06/15/2018  . 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 Severe recurrent major depression without psychotic features ([place]) 06/14/2018  . Postpartum care following vaginal delivery 01/30/2016    Past Surgical History:  Procedure Laterality Date  . TONSILLECTOMY AND ADENOIDECTOMY      Prior to Admission medications   Medication Sig Start Date End Date Taking? Authorizing Provider  citalopram (CELEXA) 20 MG tablet Take 1 tablet (20 mg total) by mouth daily.  11/12/18 11/12/19 Yes Nena Polio, MD  gabapentin (NEURONTIN) 100 MG capsule Take 1 capsule (100 mg total) by mouth 3 (three) times daily. 03/01/19  Yes Patrecia Pour, NP  traZODone (DESYREL) 100 MG tablet Take 1 tablet (100 mg total) by mouth at bedtime. 11/12/18  Yes Nena Polio, MD  albuterol (VENTOLIN HFA) 108 (90 Base) MCG/ACT inhaler Inhale 2 puffs into the lungs every 6 (six) hours as needed for wheezing or shortness of breath. 11/12/18   Nena Polio, MD    Allergies Patient has no known allergies.  No family history on file.  Social History Social History   Tobacco Use  . Smoking status: Current Every Day Smoker    Packs/day: 1.00    Types: Cigarettes  . Smokeless tobacco: Never Used  Substance Use Topics  . Alcohol use: Yes    Comment: social   . Drug use: Yes    Types: Marijuana    Review of Systems  Constitutional: No fever/chills Eyes: No visual changes. ENT: No sore throat. Cardiovascular: Denies chest pain. Respiratory: Denies shortness of breath. Gastrointestinal: No abdominal pain.  No nausea, no vomiting.  No diarrhea.  No constipation. Genitourinary: Negative for dysuria. Musculoskeletal: Negative for back pain. Skin: Negative for rash. Neurological: Positive for headache, negative for focal weakness or numbness.  Positive for anxiety.  ____________________________________________   PHYSICAL EXAM:  VITAL SIGNS: ED Triage Vitals  Enc Vitals Group     BP 04/10/19 1327 111/71     Pulse Rate 04/10/19 1327  72     Resp 04/10/19 1327 16     Temp 04/10/19 1327 98.3 F (36.8 C)     Temp Source 04/10/19 1327 Oral     SpO2 04/10/19 1327 99 %     Weight 04/10/19 1328 156 lb (70.8 kg)     Height 04/10/19 1328 5\' 4"  (1.626 m)     Head Circumference --      Peak Flow --      Pain Score 04/10/19 1328 10     Pain Loc --      Pain Edu? --      Excl. in [place]? --     Constitutional: Alert and oriented. Eyes: Conjunctivae are normal. Head:  Atraumatic. Nose: No congestion/rhinnorhea. Mouth/Throat: Mucous membranes are moist. Neck: Normal ROM Cardiovascular: Normal rate, regular rhythm. Grossly normal heart sounds. Respiratory: Normal respiratory effort.  No retractions. Lungs CTAB. Gastrointestinal: Soft and nontender. No distention. Genitourinary: deferred Musculoskeletal: No lower extremity tenderness nor edema. Neurologic:  Normal speech and language. No gross focal neurologic deficits are appreciated. Skin:  Skin is warm, dry and intact. No rash noted. Psychiatric: Mood and affect are normal. Speech and behavior are normal.  ____________________________________________   LABS (all labs ordered are listed, but only abnormal results are displayed)  Labs Reviewed  URINALYSIS, COMPLETE (UACMP) WITH MICROSCOPIC - Abnormal; Notable for the following components:      Result Value   Color, Urine AMBER (*)    APPearance HAZY (*)    Hgb urine dipstick LARGE (*)    Ketones, ur 80 (*)    Protein, ur 100 (*)    Leukocytes,Ua SMALL (*)    RBC / HPF >50 (*)    All other components within normal limits  BASIC METABOLIC PANEL - Abnormal; Notable for the following components:   Potassium 3.3 (*)    Calcium 8.5 (*)    All other components within normal limits  CBC WITH DIFFERENTIAL/PLATELET  POCT PREGNANCY, URINE  POC URINE PREG, ED     PROCEDURES  Procedure(s) performed (including Critical Care):  Procedures   ____________________________________________   INITIAL IMPRESSION / ASSESSMENT AND PLAN / ED COURSE       28 year old female with history of depression anxiety presents with worsening anxiety secondary to stress at home, additionally complains of headache.  Headache appears benign given gradual onset, do not suspect SAH or meningitis.  Will screen labs, pregnancy testing, treat symptomatically.  She denies SI or HI, does not have any evidence of psychosis.  As such, do not feel emergent psychiatric  consultation is needed and if work-up unremarkable, patient would be appropriate for outpatient psychiatric follow-up.  Labs unremarkable outside of mild hypokalemia.  Pregnancy testing negative and UA shows hematuria, patient reports being on her menses.  She reports feeling better after dose of Ativan, counseled her to follow-up with her psychiatric providers for any medication changes.  Patient agrees with plan.      ____________________________________________   FINAL CLINICAL IMPRESSION(S) / ED DIAGNOSES  Final diagnoses:  Anxiety  Panic attack     ED Discharge Orders    None       Note:  This document was prepared using Dragon voice recognition software and may include unintentional dictation errors.   Blake Divine, MD 04/10/19 [phone]

## 2019-04-10 NOTE — ED Notes (Signed)
Nurse just talked with Patient and she said she was just having so much anxiety, due to lots of things, states she has 2 children and her mom has her kids, she started breathing heavy, and crying, nurse ask her to take some slow breaths, and to stay calm, she did become calmer, nurse will continue to monitor.

## 2019-04-10 NOTE — ED Notes (Signed)
Patient ask for something for anxiety, and nurse notified MD.

## 2019-05-23 ENCOUNTER — Emergency Department
Admission: EM | Admit: 2019-05-23 | Discharge: 2019-05-23 | Disposition: A | Discharge status: Home / Self Care | Payer: Self-pay | Attending: Student | Admitting: Student

## 2019-05-23 ENCOUNTER — Other Ambulatory Visit: Payer: Self-pay

## 2019-05-23 ENCOUNTER — Encounter: Payer: Self-pay | Admitting: Emergency Medicine

## 2019-05-23 ENCOUNTER — Emergency Department: Payer: Self-pay

## 2019-05-23 DIAGNOSIS — F1210 Cannabis abuse, uncomplicated: Secondary | ICD-10-CM | POA: Insufficient documentation

## 2019-05-23 DIAGNOSIS — J4520 Mild intermittent asthma, uncomplicated: Secondary | ICD-10-CM | POA: Insufficient documentation

## 2019-05-23 DIAGNOSIS — F419 Anxiety disorder, unspecified: Secondary | ICD-10-CM | POA: Insufficient documentation

## 2019-05-23 DIAGNOSIS — Z79899 Other long term (current) drug therapy: Secondary | ICD-10-CM | POA: Insufficient documentation

## 2019-05-23 DIAGNOSIS — F17210 Nicotine dependence, cigarettes, uncomplicated: Secondary | ICD-10-CM | POA: Insufficient documentation

## 2019-05-23 MED ORDER — HYDROXYZINE HCL 10 MG PO TABS: 10.0000 mg | ORAL_TABLET | Freq: Three times a day (TID) | ORAL | 0 refills | Status: DC | PRN

## 2019-05-23 MED ORDER — LORAZEPAM 0.5 MG PO TABS
0.5000 mg | ORAL_TABLET | Freq: Once | ORAL | Status: AC
  Administered 2019-05-23: 0.5 mg via ORAL
  Filled 2019-05-23: qty 1

## 2019-05-23 MED ORDER — ALBUTEROL SULFATE HFA 108 (90 BASE) MCG/ACT IN AERS
2.0000 | INHALATION_SPRAY | Freq: Once | RESPIRATORY_TRACT | Status: AC
  Administered 2019-05-23: 2 via RESPIRATORY_TRACT
  Filled 2019-05-23: qty 6.7

## 2019-05-23 NOTE — ED Notes (Signed)
ED Provider at bedside. 

## 2019-05-23 NOTE — ED Notes (Signed)
Sister , tiera called at this time per pt request. States she will be here in 11min to pick up pt

## 2019-05-23 NOTE — ED Triage Notes (Signed)
Pt from home, has been out of anxiety meds per Father. Pt grunting and refusing to answer questions. VSS.

## 2019-05-23 NOTE — ED Notes (Signed)
Pt given to aunt at discharge.

## 2019-05-23 NOTE — ED Notes (Signed)
PT waiting in lobby for aunt to pick her up. PT in NAD, ambulatory with steady gait ntoed.

## 2019-05-23 NOTE — Discharge Instructions (Signed)
Thank you for letting us take care of you in the emergency department today.   Please continue to take any regular, prescribed medications. Use your inhaler, 2-4 puffs as needed for shortness of breath or wheezing.  New medications we have prescribed:  - Hydroxyzine - take as needed for anxiety.  Please follow up with: - Your primary care doctor to review your ER visit and follow up on your symptoms. Please make an appointment with them to discuss restarting your anxiety medication.  Please return to the ER for any new or worsening symptoms.

## 2019-05-23 NOTE — ED Notes (Addendum)
Shirley Turner , father [phone], no answer at this time, phone is turned off.

## 2019-05-23 NOTE — ED Notes (Signed)
Aunt called at this time by pt and states she will be here for discharge

## 2019-05-23 NOTE — ED Provider Notes (Signed)
[facility] Emergency Department Provider Note  ____________________________________________   First MD Initiated Contact with Patient 05/23/19 [phone]     (approximate)  I have reviewed the triage vital signs and the nursing notes.  History  Chief Complaint Panic Attack    HPI Shirley Turner is a 28 y.o. female with a history of asthma, anxiety who presents to the emergency department for anxiety.  Patient presents from home via EMS for an anxiety attack.  Patient states she has been out of her medications for several weeks, but cannot particularly clarify why.  She reports this morning she started to feel anxious, felt like her heart was racing and felt short of breath, therefore her father called EMS.  She denies any SI or HI.  She does not identify any particular inciting event for her anxiety.  Past Medical Hx Past Medical History:  Diagnosis Date  . Anxiety   . Asthma   . Depression     Problem List Patient Active Problem List   Diagnosis Date Noted  . Adjustment disorder with mixed anxiety and depressed mood 02/28/2019  . Tobacco use disorder 06/16/2018  . Cocaine use disorder, moderate, dependence ([place]) 06/15/2018  . Cannabis use disorder, moderate, dependence ([place]) 06/15/2018  . 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 Severe recurrent major depression without psychotic features ([place]) 06/14/2018  . Postpartum care following vaginal delivery 01/30/2016    Past Surgical Hx Past Surgical History:  Procedure Laterality Date  . TONSILLECTOMY AND ADENOIDECTOMY      Medications Prior to Admission medications   Medication Sig Start Date End Date Taking? Authorizing Provider  albuterol (VENTOLIN HFA) 108 (90 Base) MCG/ACT inhaler Inhale 2 puffs into the lungs every 6 (six) hours as needed for wheezing or shortness of breath. 11/12/18   Nena Polio, MD  citalopram (CELEXA) 20 MG tablet Take 1  tablet (20 mg total) by mouth daily. 11/12/18 11/12/19  Nena Polio, MD  gabapentin (NEURONTIN) 100 MG capsule Take 1 capsule (100 mg total) by mouth 3 (three) times daily. 03/01/19   Patrecia Pour, NP  traZODone (DESYREL) 100 MG tablet Take 1 tablet (100 mg total) by mouth at bedtime. 11/12/18   Nena Polio, MD    Allergies Patient has no known allergies.  Family Hx No family history on file.  Social Hx Social History   Tobacco Use  . Smoking status: Current Every Day Smoker    Packs/day: 1.00    Types: Cigarettes  . Smokeless tobacco: Never Used  Substance Use Topics  . Alcohol use: Yes    Comment: social   . Drug use: Yes    Types: Marijuana     Review of Systems  Constitutional: Negative for fever, chills. Eyes: Negative for visual changes. ENT: Negative for sore throat. Cardiovascular: Negative for chest pain. Respiratory: Negative for shortness of breath. Gastrointestinal: Negative for nausea, vomiting.  Genitourinary: Negative for dysuria. Musculoskeletal: Negative for leg swelling. Skin: Negative for rash. Neurological: Negative for for headaches.   Physical Exam  Vital Signs: ED Triage Vitals [05/23/19 0930]  Enc Vitals Group     BP (!) 141/98     Pulse Rate 98     Resp (!) 24     Temp 98.4 F (36.9 C)     Temp Source Oral     SpO2 100 %     Weight      Height  Head Circumference      Peak Flow      Pain Score      Pain Loc      Pain Edu?      Excl. in [place]?     Constitutional: Alert and oriented.  Head: Normocephalic. Atraumatic. Eyes: Conjunctivae clear. Sclera anicteric. Nose: No congestion. No rhinorrhea. Mouth/Throat: Mucous membranes are moist.  Neck: No stridor.   Cardiovascular: Normal rate, regular rhythm. Extremities well perfused. Respiratory: Normal respiratory effort.  Mild, scattered expiratory wheezes. Gastrointestinal: Soft. Non-tender. Non-distended.  Musculoskeletal: No lower extremity edema. No deformities.  Neurologic:  Normal speech and language. No gross focal neurologic deficits are appreciated.  Skin: Skin is warm, dry and intact. No rash noted. Psychiatric: Reports anxiety.  Denies SI or HI.  EKG  N/A   Radiology  CXR: IMPRESSION:  No active cardiopulmonary disease.    Procedures  Procedure(s) performed (including critical care):  Procedures   Initial Impression / Assessment and Plan / ED Course  29 y.o. female who presents to the ED for anxiety, as above.  On exam, she is somewhat anxious.  She has some mild, scattered expiratory wheezing consistent with her history of asthma, but no evidence of respiratory distress or hypoxia.  She denies any SI or HI.  Suspect patient's presentation is consistent with her known history of anxiety, in the setting of running out of her medications.  Also likely a mild underlying asthma component contributing to her SOB/wheezing.  We will obtain XR, provide symptomatic management and reassess.  XR negative.  Patient reports feeling improved after a dose of Ativan and albuterol MDI treatment.  She appears less anxious, continues to remain hemodynamically stable without evidence of respiratory distress.  As such, will plan for discharge.  Given prescription for hydroxyzine as needed and advised follow-up with her PCP to reestablish medication regimen for anxiety.  She voices understanding of this.  Given return precautions.   Final Clinical Impression(s) / ED Diagnosis  Final diagnoses:  Anxiety  Mild intermittent asthma in adult without complication       Note:  This document was prepared using Dragon voice recognition software and may include unintentional dictation errors.   Lilia Pro., MD 05/23/19 1414

## 2019-05-23 NOTE — ED Notes (Signed)
Called pt sister back again, states she can not come pick her up anymore.

## 2019-05-29 ENCOUNTER — Other Ambulatory Visit: Payer: Self-pay

## 2019-05-29 DIAGNOSIS — Z20822 Contact with and (suspected) exposure to covid-19: Secondary | ICD-10-CM

## 2019-05-31 LAB — NOVEL CORONAVIRUS, NAA: SARS-CoV-2, NAA: NOT DETECTED

## 2019-06-12 ENCOUNTER — Ambulatory Visit: Payer: Self-pay

## 2019-06-15 ENCOUNTER — Other Ambulatory Visit: Payer: Self-pay

## 2019-06-15 ENCOUNTER — Ambulatory Visit: Payer: Self-pay | Admitting: Advanced Practice Midwife

## 2019-06-15 ENCOUNTER — Encounter: Payer: Self-pay | Admitting: Advanced Practice Midwife

## 2019-06-15 DIAGNOSIS — Z7289 Other problems related to lifestyle: Secondary | ICD-10-CM

## 2019-06-15 DIAGNOSIS — Z113 Encounter for screening for infections with a predominantly sexual mode of transmission: Secondary | ICD-10-CM

## 2019-06-15 DIAGNOSIS — Z6828 Body mass index (BMI) 28.0-28.9, adult: Secondary | ICD-10-CM

## 2019-06-15 DIAGNOSIS — F1610 Hallucinogen abuse, uncomplicated: Secondary | ICD-10-CM

## 2019-06-15 DIAGNOSIS — J45909 Unspecified asthma, uncomplicated: Secondary | ICD-10-CM | POA: Insufficient documentation

## 2019-06-15 DIAGNOSIS — Z789 Other specified health status: Secondary | ICD-10-CM | POA: Insufficient documentation

## 2019-06-15 DIAGNOSIS — F325 Major depressive disorder, single episode, in full remission: Secondary | ICD-10-CM | POA: Insufficient documentation

## 2019-06-15 LAB — WET PREP FOR TRICH, YEAST, CLUE
Trichomonas Exam: NEGATIVE
Yeast Exam: NEGATIVE

## 2019-06-15 NOTE — Progress Notes (Signed)
Wet mount reviewed, pt denies symptoms, no tx per standing order. Provider orders completed.

## 2019-06-15 NOTE — Progress Notes (Signed)
STI clinic/screening visit  Subjective:  Shirley Turner is a 28 y.o.SBF G2P2 smoker female being seen today for an STI screening visit. The patient reports they do not have symptoms.  Patient has the following medical conditions:   Patient Active Problem List   Diagnosis Date Noted  . 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 Adjustment disorder with mixed anxiety and depressed mood 02/28/2019  . Tobacco use disorder 10-12 cpd 06/16/2018  . Cocaine use disorder, moderate, dependence (HCC)--last use 04/09/19 06/15/2018  . Cannabis use disorder, moderate, dependence ([place]) 06/15/2018  . 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 Severe recurrent major depression without psychotic features ([place]) 06/14/2018  . Body mass index 28.0-28.9, adult 08/30/2015     No chief complaint on file.   HPI  Patient reports no menses this month.  Last physical 11/2016.  LMP 05/05/19.  Last sex 05/13/19  See flowsheet for further details and programmatic requirements.    The following portions of the patient's history were reviewed and updated as appropriate: allergies, current medications, past medical history, past social history, past surgical history and problem list.  Objective:  There were no vitals filed for this visit.  Physical Exam Vitals signs and nursing note reviewed.  Constitutional:      Appearance: Normal appearance.  HENT:     Head: Normocephalic and atraumatic.     Mouth/Throat:     Mouth: Mucous membranes are moist.     Pharynx: Oropharynx is clear. No oropharyngeal exudate or posterior oropharyngeal erythema.  Pulmonary:     Effort: Pulmonary effort is normal.  Abdominal:     General: Abdomen is flat.     Palpations: Abdomen is soft. There is no mass.     Tenderness: There is no abdominal tenderness. There is no rebound.     Comments: Soft without tenderness  Genitourinary:    General: Normal vulva.     Exam  position: Lithotomy position.     Pubic Area: No rash or pubic lice.      Labia:        Right: No rash or lesion.        Left: No rash or lesion.      Vagina: Vaginal discharge (white creamy leukorrhea, ph equivocal) present. No erythema, bleeding or lesions.     Cervix: No cervical motion tenderness, discharge, friability, lesion or erythema.     Uterus: Normal.      Adnexa: Right adnexa normal and left adnexa normal.     Rectum: Normal.  Lymphadenopathy:     Head:     Right side of head: No preauricular or posterior auricular adenopathy.     Left side of head: No preauricular or posterior auricular adenopathy.     Cervical: No cervical adenopathy.     Upper Body:     Right upper body: No supraclavicular or axillary adenopathy.     Left upper body: No supraclavicular or axillary adenopathy.     Lower Body: No right inguinal adenopathy. No left inguinal adenopathy.  Skin:    General: Skin is warm and dry.     Findings: No rash.  Neurological:     Mental Status: She is alert and oriented to person, place, and time.       Assessment and Plan:  Shirley Turner is a 28 y.o. female presenting to the [facility] for STI screening  1. Screening examination for venereal disease  Treat wet mount per standing orders Immunization nurse consult - WET PREP FOR [place], YEAST, [place] [facility] - Syphilis Serology, [facility]  2. 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  No future appointments.  Herbie Saxon, CNM

## 2019-06-26 ENCOUNTER — Other Ambulatory Visit: Payer: Self-pay

## 2019-06-26 ENCOUNTER — Emergency Department
Admission: EM | Admit: 2019-06-26 | Discharge: 2019-06-26 | Disposition: A | Discharge status: Home / Self Care | Payer: Self-pay | Attending: Emergency Medicine | Admitting: Emergency Medicine

## 2019-06-26 ENCOUNTER — Emergency Department: Payer: Self-pay

## 2019-06-26 ENCOUNTER — Encounter: Payer: Self-pay | Admitting: Emergency Medicine

## 2019-06-26 DIAGNOSIS — F329 Major depressive disorder, single episode, unspecified: Secondary | ICD-10-CM | POA: Insufficient documentation

## 2019-06-26 DIAGNOSIS — F1410 Cocaine abuse, uncomplicated: Secondary | ICD-10-CM | POA: Insufficient documentation

## 2019-06-26 DIAGNOSIS — S92355A Nondisplaced fracture of fifth metatarsal bone, left foot, initial encounter for closed fracture: Secondary | ICD-10-CM | POA: Insufficient documentation

## 2019-06-26 DIAGNOSIS — F17210 Nicotine dependence, cigarettes, uncomplicated: Secondary | ICD-10-CM | POA: Insufficient documentation

## 2019-06-26 DIAGNOSIS — F1610 Hallucinogen abuse, uncomplicated: Secondary | ICD-10-CM | POA: Insufficient documentation

## 2019-06-26 DIAGNOSIS — Y998 Other external cause status: Secondary | ICD-10-CM | POA: Insufficient documentation

## 2019-06-26 DIAGNOSIS — Y9289 Other specified places as the place of occurrence of the external cause: Secondary | ICD-10-CM | POA: Insufficient documentation

## 2019-06-26 DIAGNOSIS — R5383 Other fatigue: Secondary | ICD-10-CM | POA: Insufficient documentation

## 2019-06-26 DIAGNOSIS — J45909 Unspecified asthma, uncomplicated: Secondary | ICD-10-CM | POA: Insufficient documentation

## 2019-06-26 DIAGNOSIS — Y9389 Activity, other specified: Secondary | ICD-10-CM | POA: Insufficient documentation

## 2019-06-26 DIAGNOSIS — F1210 Cannabis abuse, uncomplicated: Secondary | ICD-10-CM | POA: Insufficient documentation

## 2019-06-26 DIAGNOSIS — Z79899 Other long term (current) drug therapy: Secondary | ICD-10-CM | POA: Insufficient documentation

## 2019-06-26 DIAGNOSIS — X509XXA Other and unspecified overexertion or strenuous movements or postures, initial encounter: Secondary | ICD-10-CM | POA: Insufficient documentation

## 2019-06-26 LAB — BASIC METABOLIC PANEL
Anion gap: 8 (ref 5–15)
BUN: 11 mg/dL (ref 6–20)
CO2: 26 mmol/L (ref 22–32)
Calcium: 8.4 mg/dL — ABNORMAL LOW (ref 8.9–10.3)
Chloride: 106 mmol/L (ref 98–111)
Creatinine, Ser: 0.79 mg/dL (ref 0.44–1.00)
GFR calc Af Amer: >60 mL/min (ref >60)
GFR calc non Af Amer: >60 mL/min (ref >60)
Glucose, Bld: 108 mg/dL — ABNORMAL HIGH (ref 70–99)
Potassium: 3.9 mmol/L (ref 3.5–5.1)
Sodium: 140 mmol/L (ref 135–145)

## 2019-06-26 LAB — CBC WITH DIFFERENTIAL/PLATELET
Abs Immature Granulocytes: 0.01 10*3/uL (ref 0.00–0.07)
Basophils Absolute: 0 10*3/uL (ref 0.0–0.1)
Basophils Relative: 0 %
Eosinophils Absolute: 0.2 10*3/uL (ref 0.0–0.5)
Eosinophils Relative: 3 %
HCT: 34.8 % — ABNORMAL LOW (ref 36.0–46.0)
Hemoglobin: 11.9 g/dL — ABNORMAL LOW (ref 12.0–15.0)
Immature Granulocytes: 0 %
Lymphocytes Relative: 49 %
Lymphs Abs: 2.8 10*3/uL (ref 0.7–4.0)
MCH: 30.4 pg (ref 26.0–34.0)
MCHC: 34.2 g/dL (ref 30.0–36.0)
MCV: 88.8 fL (ref 80.0–100.0)
Monocytes Absolute: 0.3 10*3/uL (ref 0.1–1.0)
Monocytes Relative: 5 %
Neutro Abs: 2.5 10*3/uL (ref 1.7–7.7)
Neutrophils Relative %: 43 %
Platelets: 224 10*3/uL (ref 150–400)
RBC: 3.92 MIL/uL (ref 3.87–5.11)
RDW: 13.8 % (ref 11.5–15.5)
WBC: 5.9 10*3/uL (ref 4.0–10.5)
nRBC: 0 % (ref 0.0–0.2)

## 2019-06-26 LAB — POCT PREGNANCY, URINE: Preg Test, Ur: NEGATIVE

## 2019-06-26 MED ORDER — HYDROCODONE-ACETAMINOPHEN 5-325 MG PO TABS: 1.0000 | ORAL_TABLET | Freq: Four times a day (QID) | ORAL | 0 refills | Status: DC | PRN

## 2019-06-26 MED ORDER — CITALOPRAM HYDROBROMIDE 20 MG PO TABS: 20.0000 mg | ORAL_TABLET | Freq: Every day | ORAL | 2 refills | Status: DC

## 2019-06-26 MED ORDER — TRAZODONE HCL 100 MG PO TABS: 100.0000 mg | ORAL_TABLET | Freq: Every day | ORAL | 2 refills | Status: DC

## 2019-06-26 NOTE — ED Notes (Signed)
See triage note  Presents with left foot/ankle pain   States her foot was asleep  When she stood up her foot rolled  Having pain to lateral left foot/ankle  Min swelling noted   Good pulses

## 2019-06-26 NOTE — ED Provider Notes (Signed)
[facility] Emergency Department Provider Note  ____________________________________________   First MD Initiated Contact with Patient 06/26/19 1011     (approximate)  I have reviewed the triage vital signs and the nursing notes.   HISTORY  Chief Complaint Foot Injury    HPI Shirley Turner is a 28 y.o. female department complaining of left foot and ankle pain.  States she started falling asleep at work; remains on her foot.  Pain for 3 days.  She is also very concerned.  She is married.  States she is currently stable.  Unsure of glucose or some other problem.  Denies any chest pain or shortness of breath.  She came home pregnancy test which was negative.    Past Medical History:  Diagnosis Date  . Anxiety   . Asthma   . Depression     Patient Active Problem List   Diagnosis Date Noted  . 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 Asthma 06/15/2019  . Alcohol use 8 shots Henessey 04/2019 06/15/2019  . Depression, major, single episode, complete remission ([place]) dx'd 2016 06/15/2019  . Adjustment disorder with mixed anxiety and depressed mood 02/28/2019  . Tobacco use disorder 10-12 cpd 06/16/2018  . Cocaine use disorder, moderate, dependence (HCC)--last use 04/09/19 06/15/2018  . Cannabis use disorder, moderate, dependence ([place]) 06/15/2018  . 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 Severe recurrent major depression without psychotic features ([place]) 06/14/2018  . Body mass index 28.0-28.9, adult 08/30/2015    Past Surgical History:  Procedure Laterality Date  . TONSILLECTOMY AND ADENOIDECTOMY      Prior to Admission medications   Medication Sig Start Date End Date Taking? Authorizing Provider  albuterol (VENTOLIN HFA) 108 (90 Base) MCG/ACT inhaler Inhale 2 puffs into the lungs every 6 (six) hours as needed for wheezing or shortness of breath. 11/12/18   Nena Polio, MD  citalopram  (CELEXA) 20 MG tablet Take 1 tablet (20 mg total) by mouth daily. 06/26/19 06/25/20  , Linden Dolin, PA-C  gabapentin (NEURONTIN) 100 MG capsule Take 1 capsule (100 mg total) by mouth 3 (three) times daily. 03/01/19   Patrecia Pour, NP  HYDROcodone-acetaminophen (NORCO/VICODIN) 5-325 MG tablet Take 1 tablet by mouth every 6 (six) hours as needed for moderate pain. 06/26/19   , Linden Dolin, PA-C  hydrOXYzine (ATARAX/VISTARIL) 10 MG tablet Take 1 tablet (10 mg total) by mouth 3 (three) times daily as needed for anxiety. 05/23/19   Lilia Pro., MD  traZODone (DESYREL) 100 MG tablet Take 1 tablet (100 mg total) by mouth at bedtime. 06/26/19   Versie Starks, PA-C    Allergies Patient has no known allergies.  Family History  Problem Relation Age of Onset  . Hypertension Mother   . Depression Sister   . Bipolar disorder Sister   . Diabetes Maternal Grandmother   . Hypertension Maternal Grandmother   . Kidney disease Maternal Grandmother   . Congestive Heart Failure Maternal Grandmother   . Diabetes Paternal Grandmother   . Hypertension Paternal Grandmother   . Arthritis Paternal Grandmother   . ADD / ADHD Half-Sister     Social History Social History   Tobacco Use  . Smoking status: Current Every Day Smoker    Packs/day: 1.00    Types: Cigarettes  . Smokeless tobacco: Never Used  Substance Use Topics  . Alcohol use: Yes    Alcohol/week: 8.0 standard drinks    Types: 8  Shots of liquor per week    Comment: 8 shots of Henessey 04/2019  . Drug use: Yes    Types: Marijuana, Cocaine    Comment: last use 04/09/19 + ecstacy    Review of Systems  Constitutional: No fever/chills, fatigue Eyes: No visual changes. ENT: No sore throat. Respiratory: Denies cough Genitourinary: Negative for dysuria. Musculoskeletal: Negative for back pain.  Left foot pain Skin: Negative for rash.    ____________________________________________   PHYSICAL EXAM:  VITAL SIGNS: ED Triage Vitals   Enc Vitals Group     BP 06/26/19 0942 106/72     Pulse Rate 06/26/19 0942 61     Resp 06/26/19 0942 20     Temp 06/26/19 0942 98.3 F (36.8 C)     Temp Source 06/26/19 0942 Oral     SpO2 --      Weight 06/26/19 0943 154 lb (69.9 kg)     Height 06/26/19 0943 5' 3.25" (1.607 m)     Head Circumference --      Peak Flow --      Pain Score 06/26/19 0943 8     Pain Loc --      Pain Edu? --      Excl. in [place]? --     Constitutional: Alert and oriented. Well appearing and in no acute distress. Eyes: Conjunctivae are normal.  Head: Atraumatic. Nose: No congestion/rhinnorhea. Mouth/Throat: Mucous membranes are moist.   Neck:  supple no lymphadenopathy noted Cardiovascular: Normal rate, regular rhythm. Heart sounds are normal Respiratory: Normal respiratory effort.  No retractions, lungs c t a  GU: deferred Musculoskeletal: FROM all extremities, warm and well perfused, positive for left fifth metatarsal pain, left ankle is tender at the deltoid ligament, neurovascular is intact Neurologic:  Normal speech and language.  Skin:  Skin is warm, dry and intact. No rash noted. Psychiatric: Mood and affect are normal. Speech and behavior are normal.  ____________________________________________   LABS (all labs ordered are listed, but only abnormal results are displayed)  Labs Reviewed  CBC WITH DIFFERENTIAL/PLATELET - Abnormal; Notable for the following components:      Result Value   Hemoglobin 11.9 (*)    HCT 34.8 (*)    All other components within normal limits  BASIC METABOLIC PANEL - Abnormal; Notable for the following components:   Glucose, Bld 108 (*)    Calcium 8.4 (*)    All other components within normal limits  POC URINE PREG, ED   ____________________________________________   ____________________________________________  RADIOLOGY  X-ray of the left ankle shows 5th metatarsal fracture  ____________________________________________   PROCEDURES  Procedure(s)  performed: Ace wrap, wooden shoe  Procedures    ____________________________________________   INITIAL IMPRESSION / ASSESSMENT AND PLAN / ED COURSE  Pertinent labs & imaging results that were available during my care of the patient were reviewed by me and considered in my medical decision making (see chart for details).   Patient is 28 year old female presents emergency department complaining of extreme fatigue and left ankle pain.  See HPI  Physical exam patient appears well.  Vitals normal.  She is drowsy.  Left fifth metatarsal is tender, left deltoid ligament at the ankle is tender  X-ray of the left ankle shows 1/5 metatarsal fracture  Explained the findings to the patient.  That is when she discussed the fatigue with me.  Therefore we will draw a CBC and met B and do a POC pregnancy    ----------------------------------------- 1:01 PM on 06/26/2019 -----------------------------------------  CBC and met B are normal.  POC pregnancy is being obtained at this time.  Explained to the patient her labs are normal.  She admits to not having her depression medication which I told her may be causing all the drowsiness.  She states she has difficulty sleeping and tosses and turns because she is out of her trazodone.  Explained her I could send both of these to her pharmacy.  She will be discharged in stable condition after the POC pregnancy has resulted. poc pregnancy is negative   Shirley Turner was evaluated in Emergency Department on 06/26/2019 for the symptoms described in the history of present illness. She was evaluated in the context of the global COVID-19 pandemic, which necessitated consideration that the patient might be at risk for infection with the SARS-CoV-2 virus that causes COVID-19. Institutional protocols and algorithms that pertain to the evaluation of patients at risk for COVID-19 are in a state of rapid change based on information released by regulatory bodies  including the CDC and federal and state organizations. These policies and algorithms were followed during the patient's care in the ED.   As part of my medical decision making, I reviewed the following data within the [facility] notes reviewed and incorporated, Labs reviewed , Old chart reviewed, Radiograph reviewed , Notes from prior ED visits and [place] Controlled Substance Database  ____________________________________________   FINAL CLINICAL IMPRESSION(S) / ED DIAGNOSES  Final diagnoses:  Nondisplaced fracture of fifth metatarsal bone, left foot, initial encounter for closed fracture  Fatigue due to depression      NEW MEDICATIONS STARTED DURING THIS VISIT:  New Prescriptions   HYDROCODONE-ACETAMINOPHEN (NORCO/VICODIN) 5-325 MG TABLET    Take 1 tablet by mouth every 6 (six) hours as needed for moderate pain.     Note:  This document was prepared using Dragon voice recognition software and may include unintentional dictation errors.    Versie Starks, PA-C 06/26/19 1310    Blake Divine, MD 06/26/19 [phone]

## 2019-06-26 NOTE — ED Triage Notes (Signed)
Patient to ER for c/o injury to left ankle/foot. Patient states she accidentally fell asleep at work (works 3rd shift). Patient states she went to stand up and rolled ankle under her, now having pain to left ankle/left foot.

## 2019-06-26 NOTE — Discharge Instructions (Addendum)
Follow-up with orthopedics.  Please call for an appointment.  Wear the shoe until released by orthopedics.

## 2019-08-18 IMAGING — CR DG CHEST 2V
1 series · 2 of 2 positions shown · non-contrast
Comparison: 09/25/2017.

CLINICAL DATA: Mild chest pain.  Tightness.

EXAM:
CHEST - 2 VIEW

[Series 1: dg chest 2 view · 0.14mm/px · 2 of 2 slices shown]
[im 1/2]
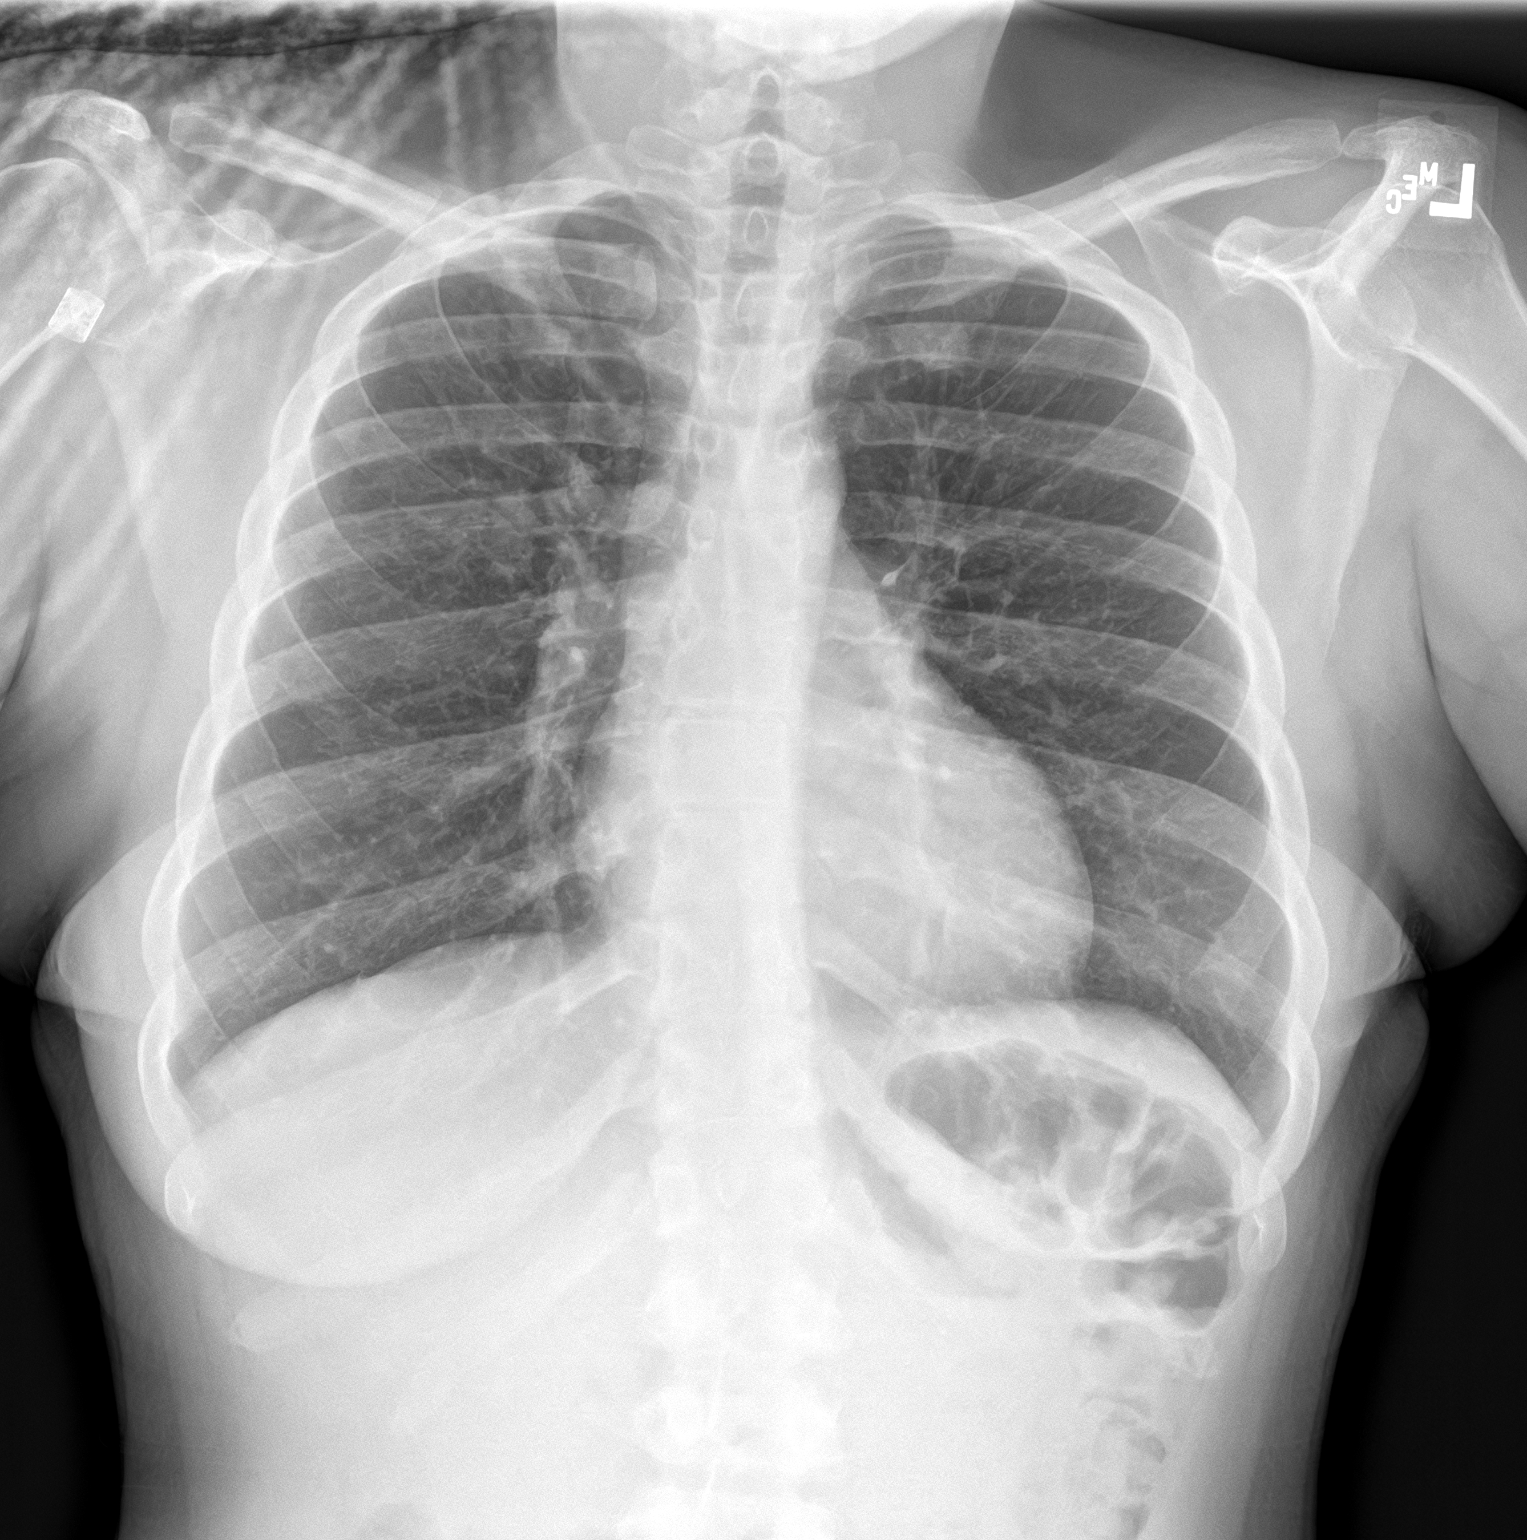
[im 2/2]
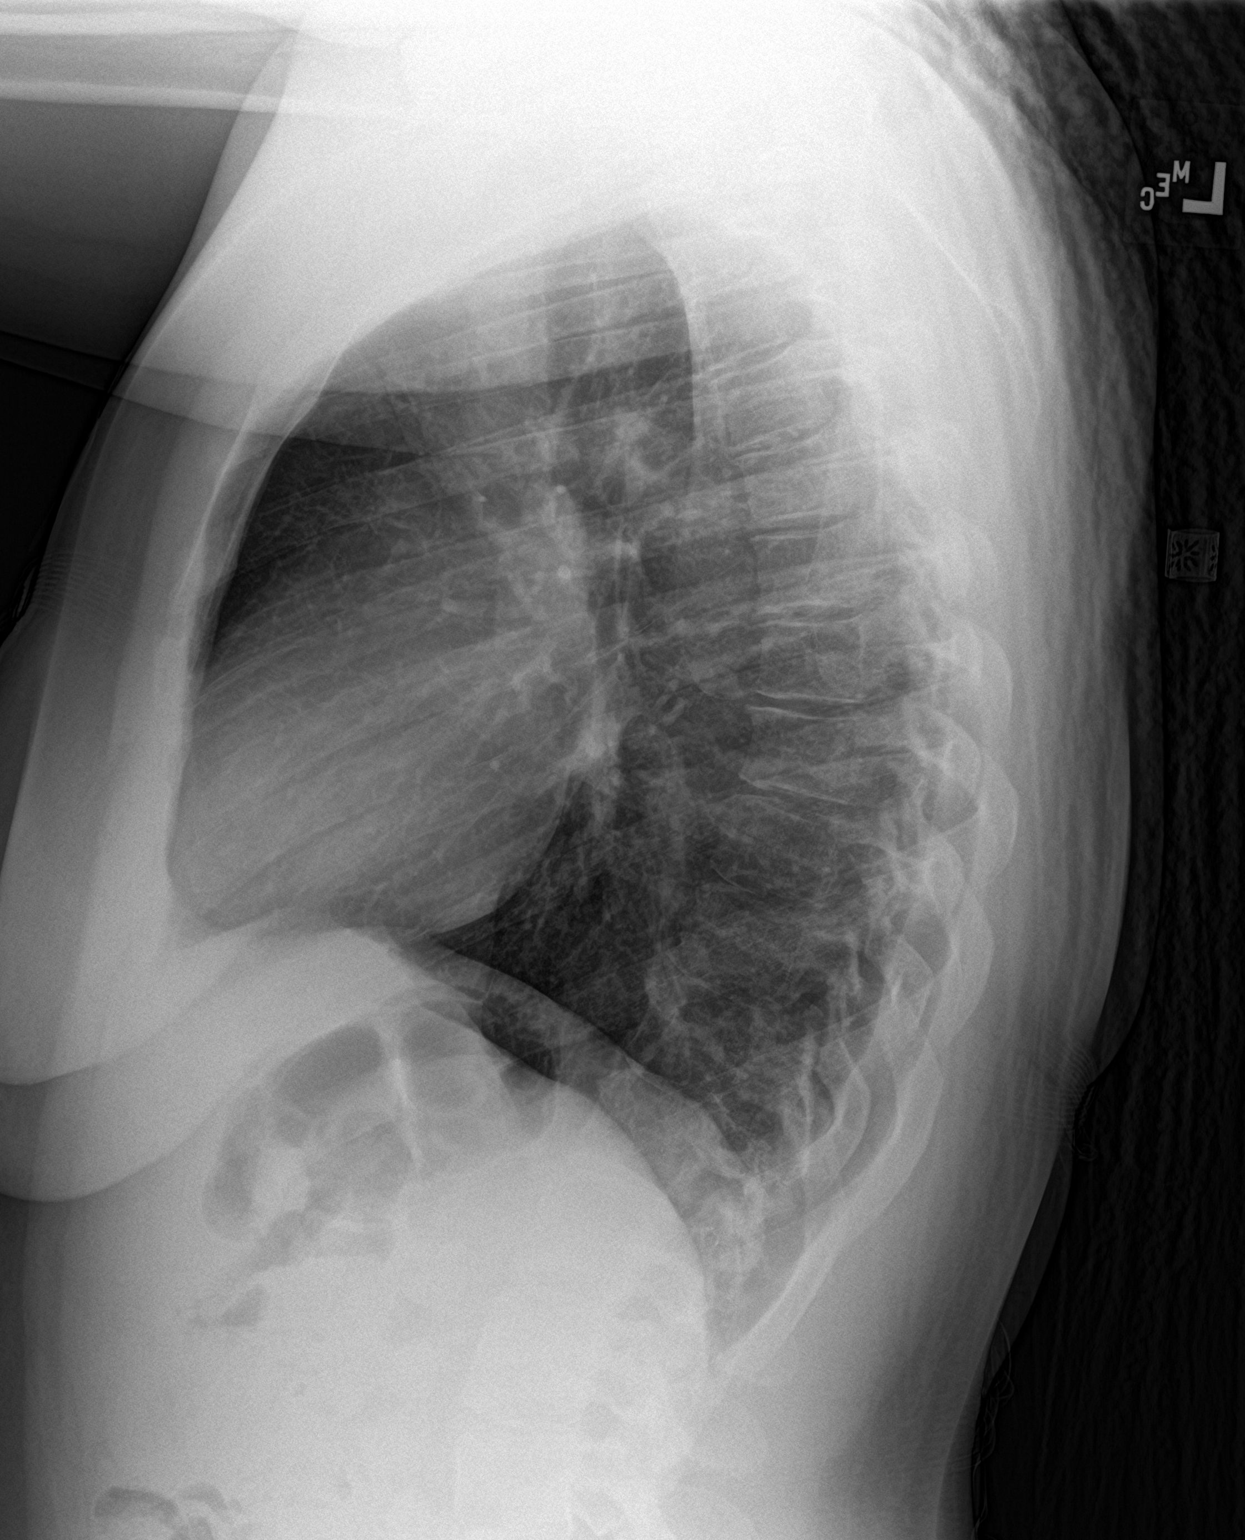

[2 of 2 positions shown; findings below may reference images not displayed]

FINDINGS: Mediastinum and hilar structures are normal. Lungs are clear. No
pleural effusion or pneumothorax. Heart size normal. No acute bony
abnormality.
IMPRESSION: No acute cardiopulmonary disease.

## 2019-12-27 ENCOUNTER — Encounter (HOSPITAL_COMMUNITY): Payer: Self-pay

## 2019-12-27 ENCOUNTER — Emergency Department (HOSPITAL_COMMUNITY)
Admission: EM | Admit: 2019-12-27 | Discharge: 2019-12-27 | Disposition: A | Discharge status: Home / Self Care | Payer: Self-pay | Attending: Emergency Medicine | Admitting: Emergency Medicine

## 2019-12-27 ENCOUNTER — Other Ambulatory Visit: Payer: Self-pay

## 2019-12-27 DIAGNOSIS — F419 Anxiety disorder, unspecified: Secondary | ICD-10-CM | POA: Insufficient documentation

## 2019-12-27 DIAGNOSIS — J4521 Mild intermittent asthma with (acute) exacerbation: Secondary | ICD-10-CM | POA: Insufficient documentation

## 2019-12-27 DIAGNOSIS — Z79899 Other long term (current) drug therapy: Secondary | ICD-10-CM | POA: Insufficient documentation

## 2019-12-27 DIAGNOSIS — F17210 Nicotine dependence, cigarettes, uncomplicated: Secondary | ICD-10-CM | POA: Insufficient documentation

## 2019-12-27 MED ORDER — PREDNISONE 20 MG PO TABS: 40.0000 mg | ORAL_TABLET | Freq: Every day | ORAL | 0 refills | Status: DC

## 2019-12-27 MED ORDER — HYDROXYZINE HCL 25 MG PO TABS: 25.0000 mg | ORAL_TABLET | Freq: Four times a day (QID) | ORAL | 0 refills | Status: DC

## 2019-12-27 MED ORDER — AEROCHAMBER PLUS FLO-VU MEDIUM MISC
1.0000 | Freq: Once | Status: AC
  Administered 2019-12-27: 1
  Filled 2019-12-27: qty 1

## 2019-12-27 MED ORDER — PREDNISONE 20 MG PO TABS
60.0000 mg | ORAL_TABLET | Freq: Once | ORAL | Status: AC
  Administered 2019-12-27: 60 mg via ORAL
  Filled 2019-12-27: qty 3

## 2019-12-27 MED ORDER — ALBUTEROL SULFATE HFA 108 (90 BASE) MCG/ACT IN AERS: 1.0000 | INHALATION_SPRAY | Freq: Four times a day (QID) | RESPIRATORY_TRACT | 0 refills | Status: DC | PRN

## 2019-12-27 MED ORDER — ALBUTEROL SULFATE HFA 108 (90 BASE) MCG/ACT IN AERS
4.0000 | INHALATION_SPRAY | Freq: Once | RESPIRATORY_TRACT | Status: AC
  Administered 2019-12-27: 4 via RESPIRATORY_TRACT
  Filled 2019-12-27: qty 6.7

## 2019-12-27 MED ORDER — LORAZEPAM 1 MG PO TABS
1.0000 mg | ORAL_TABLET | Freq: Once | ORAL | Status: AC
  Administered 2019-12-27: 1 mg via ORAL
  Filled 2019-12-27: qty 1

## 2019-12-27 NOTE — ED Provider Notes (Signed)
[facility] DEPT Provider Note   CSN: [id number] Arrival date & time: 12/27/19  1348     History No chief complaint on file.   Shirley Turner is a 29 y.o. female with a past medical history significant for anxiety, asthma, depression who presents to the ED via EMS due to an anxiety attack.  Patient states she has been having intermittent episodes of full body numbness/tingling, difficulties breathing, and diaphoresis. Right now she states she is "trying not to get worked up". Denies chest pain and lower extremity edema. She typically takes anxiety medication, but has not taken her medication in awhile. Denies any drug and alcohol use today. No treatment prior to arrival. Patient notes she is going through a lot recently which has increased her anxiety.   History obtained from patient and past medical records. No interpreter used during encounter.      Past Medical History:  Diagnosis Date  . Anxiety   . Asthma   . Depression     Patient Active Problem List   Diagnosis Date Noted  . 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 Asthma 06/15/2019  . Alcohol use 8 shots Henessey 04/2019 06/15/2019  . Depression, major, single episode, complete remission ([place]) dx'd 2016 06/15/2019  . Adjustment disorder with mixed anxiety and depressed mood 02/28/2019  . Tobacco use disorder 10-12 cpd 06/16/2018  . Cocaine use disorder, moderate, dependence (HCC)--last use 04/09/19 06/15/2018  . Cannabis use disorder, moderate, dependence ([place]) 06/15/2018  . 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 Severe recurrent major depression without psychotic features ([place]) 06/14/2018  . Body mass index 28.0-28.9, adult 08/30/2015    Past Surgical History:  Procedure Laterality Date  . TONSILLECTOMY AND ADENOIDECTOMY       OB History    Gravida  2   Para  1   Term      Preterm      AB      Living  1     SAB        TAB      Ectopic      Multiple      Live Births  1           Family History  Problem Relation Age of Onset  . Hypertension Mother   . Depression Sister   . Bipolar disorder Sister   . Diabetes Maternal Grandmother   . Hypertension Maternal Grandmother   . Kidney disease Maternal Grandmother   . Congestive Heart Failure Maternal Grandmother   . Diabetes Paternal Grandmother   . Hypertension Paternal Grandmother   . Arthritis Paternal Grandmother   . ADD / ADHD Half-Sister     Social History   Tobacco Use  . Smoking status: Current Every Day Smoker    Packs/day: 1.00    Types: Cigarettes  . Smokeless tobacco: Never Used  Substance Use Topics  . Alcohol use: Yes    Alcohol/week: 8.0 standard drinks    Types: 8 Shots of liquor per week    Comment: 8 shots of Henessey 04/2019  . Drug use: Yes    Types: Marijuana, Cocaine    Comment: last use 04/09/19 + ecstacy    Home Medications Prior to Admission medications   Medication Sig Start Date End Date Taking? Authorizing Provider  albuterol (VENTOLIN HFA) 108 (90 Base) MCG/ACT inhaler Inhale 2 puffs into the lungs every 6 (six) hours as needed for wheezing or shortness of  breath. 11/12/18   Nena Polio, MD  albuterol (VENTOLIN HFA) 108 (90 Base) MCG/ACT inhaler Inhale 1-2 puffs into the lungs every 6 (six) hours as needed for wheezing or shortness of breath. 12/27/19   Suzy Bouchard, PA-C  citalopram (CELEXA) 20 MG tablet Take 1 tablet (20 mg total) by mouth daily. 06/26/19 06/25/20  Fisher, Linden Dolin, PA-C  gabapentin (NEURONTIN) 100 MG capsule Take 1 capsule (100 mg total) by mouth 3 (three) times daily. 03/01/19   Patrecia Pour, NP  HYDROcodone-acetaminophen (NORCO/VICODIN) 5-325 MG tablet Take 1 tablet by mouth every 6 (six) hours as needed for moderate pain. 06/26/19   Fisher, Linden Dolin, PA-C  hydrOXYzine (ATARAX/VISTARIL) 10 MG tablet Take 1 tablet (10 mg total) by mouth 3 (three) times daily as needed for  anxiety. 05/23/19   Lilia Pro., MD  hydrOXYzine (ATARAX/VISTARIL) 25 MG tablet Take 1 tablet (25 mg total) by mouth every 6 (six) hours. 12/27/19   Suzy Bouchard, PA-C  predniSONE (DELTASONE) 20 MG tablet Take 2 tablets (40 mg total) by mouth daily for 4 days. 12/27/19 12/31/19  Suzy Bouchard, PA-C  traZODone (DESYREL) 100 MG tablet Take 1 tablet (100 mg total) by mouth at bedtime. 06/26/19   Versie Starks, PA-C    Allergies    Patient has no known allergies.  Review of Systems   Review of Systems  Constitutional: Positive for diaphoresis.  Respiratory: Positive for shortness of breath.   Cardiovascular: Negative for chest pain.  Gastrointestinal: Negative for abdominal pain, nausea and vomiting.  Neurological: Positive for numbness.  All other systems reviewed and are negative.   Physical Exam Updated Vital Signs BP 111/80 (BP Location: Right Arm)   Pulse 60   Temp 98.5 F (36.9 C) (Oral)   Resp 16   SpO2 100%   Physical Exam Vitals and nursing note reviewed.  Constitutional:      General: She is not in acute distress.    Appearance: She is not ill-appearing.     Comments: Patient resting comfortably in chair.   HENT:     Head: Normocephalic.  Eyes:     Pupils: Pupils are equal, round, and reactive to light.  Cardiovascular:     Rate and Rhythm: Normal rate and regular rhythm.     Pulses: Normal pulses.     Heart sounds: Normal heart sounds. No murmur. No friction rub. No gallop.   Pulmonary:     Effort: Pulmonary effort is normal.     Breath sounds: Normal breath sounds.     Comments: Respirations equal and unlabored, patient able to speak in full sentences, expiratory wheeze heard throughout.   Abdominal:     General: Abdomen is flat. There is no distension.     Palpations: Abdomen is soft.     Tenderness: There is no abdominal tenderness. There is no guarding or rebound.  Musculoskeletal:     Cervical back: Neck supple.     Comments: No lower  extremity edema. Negative homan sign bilaterally.   Skin:    General: Skin is warm and dry.  Neurological:     General: No focal deficit present.     Mental Status: She is alert.  Psychiatric:        Mood and Affect: Mood is anxious.        Behavior: Behavior normal.     ED Results / Procedures / Treatments   Labs (all labs ordered are listed, but only abnormal results are  displayed) Labs Reviewed - No data to display  EKG None  Radiology No results found.  Procedures Procedures (including critical care time)  Medications Ordered in ED Medications  albuterol (VENTOLIN HFA) 108 (90 Base) MCG/ACT inhaler 4 puff (4 puffs Inhalation Given 12/27/19 1430)  AeroChamber Plus Flo-Vu Medium MISC 1 each (1 each Other Given 12/27/19 1430)  LORazepam (ATIVAN) tablet 1 mg (1 mg Oral Given 12/27/19 1430)  predniSONE (DELTASONE) tablet 60 mg (60 mg Oral Given 12/27/19 1632)    ED Course  I have reviewed the triage vital signs and the nursing notes.  Pertinent labs & imaging results that were available during my care of the patient were reviewed by me and considered in my medical decision making (see chart for details).    MDM Rules/Calculators/A&P                     29 year old female presents to the ED due to an anxiety attack.  Patient has a history of anxiety is not currently on any medications.  Stable vitals.  Patient no acute distress and non-ill-appearing.  She is resting comfortably in bed during initial evaluation.  She appears slightly anxious.  Patient speaking in full sentences.  No accessory muscle usage.  Expiratory wheeze heard throughout likely due to her history of asthma.  Suspect symptoms related to an anxiety attack with a mild asthma exacerbation.  Will treat with Ativan and albuterol and reassess patient.  No infectious symptoms to suggest pneumonia or Covid.  4:13 PM reassessed patient at bedside who notes complete resolution in symptoms after medication.  Lungs clear to  auscultation bilaterally with no wheeze.  Patient states she ran out of her inhaler. Will give steroids here in the ED and discharge patient with steroids and albuterol for asthma exacerbation. Will also discharge with Vistaril as needed for anxiety. [facility] number given to patient. Advised patient to call tomorrow to schedule an appointment for further evaluation.   Final Clinical Impression(s) / ED Diagnoses Final diagnoses:  Anxiety  Mild intermittent asthma with exacerbation    Rx / DC Orders ED Discharge Orders         Ordered    predniSONE (DELTASONE) 20 MG tablet  Daily     12/27/19 1616    albuterol (VENTOLIN HFA) 108 (90 Base) MCG/ACT inhaler  Every 6 hours PRN     12/27/19 1616    hydrOXYzine (ATARAX/VISTARIL) 25 MG tablet  Every 6 hours     12/27/19 1616           Suzy Bouchard, PA-C 12/27/19 1648    Lucrezia Starch, MD 12/30/19 [phone]

## 2019-12-27 NOTE — ED Triage Notes (Signed)
Pt BIB EMS from home. Pt reports family issues that are causing her anxiety. Hx of anxiety and is currently not taking medications for anxiety. Initially pt was screaming and flailing around with EMS, but is now calm. Pt denies SI/HI.

## 2019-12-27 NOTE — Discharge Instructions (Addendum)
As discussed, it appears you are having a little bit of an asthma exacerbation. I am sending you home with a refill on your albuterol and steroids. Take as prescribed and start the steroids tomorrow. I am also sending you home with anxiety medication as needed. I have included the [facility] for [facility]. Call tomorrow to schedule an appointment for further evaluation of asthma and anxiety. Return to the ER for new or worsening symptoms.

## 2019-12-28 ENCOUNTER — Telehealth (HOSPITAL_COMMUNITY): Payer: Self-pay | Admitting: Emergency Medicine

## 2019-12-28 MED ORDER — HYDROXYZINE HCL 25 MG PO TABS: 25.0000 mg | ORAL_TABLET | Freq: Four times a day (QID) | ORAL | 0 refills | Status: DC

## 2019-12-28 MED ORDER — PREDNISONE 20 MG PO TABS: 40.0000 mg | ORAL_TABLET | Freq: Every day | ORAL | 0 refills | Status: AC

## 2019-12-28 MED ORDER — ALBUTEROL SULFATE HFA 108 (90 BASE) MCG/ACT IN AERS: 1.0000 | INHALATION_SPRAY | Freq: Four times a day (QID) | RESPIRATORY_TRACT | 0 refills | Status: DC | PRN

## 2019-12-28 NOTE — Telephone Encounter (Signed)
Changing rx to alternate pharmacy.

## 2020-01-20 ENCOUNTER — Other Ambulatory Visit: Payer: Self-pay

## 2020-01-20 ENCOUNTER — Ambulatory Visit: Payer: Self-pay

## 2020-01-20 ENCOUNTER — Ambulatory Visit: Payer: Self-pay | Admitting: Family Medicine

## 2020-01-20 ENCOUNTER — Encounter: Payer: Self-pay | Admitting: Family Medicine

## 2020-01-20 DIAGNOSIS — Z113 Encounter for screening for infections with a predominantly sexual mode of transmission: Secondary | ICD-10-CM

## 2020-01-20 LAB — WET PREP FOR TRICH, YEAST, CLUE
Trichomonas Exam: NEGATIVE
Yeast Exam: NEGATIVE

## 2020-01-20 NOTE — Progress Notes (Signed)
[employer] results reviewed. No treatment indicated per standing orders. Hal Morales, RN

## 2020-01-20 NOTE — Progress Notes (Signed)
[facility] Department STI clinic/screening visit  Subjective:  Shirley Turner is a 29 y.o. female being seen today for an STI screening visit. The patient reports they do have symptoms.  Patient reports that they do desire a pregnancy in the next year.   They reported they are not interested in discussing contraception today.  Patient's last menstrual period was 01/07/2020 (exact date).   Patient has the following medical conditions:   Patient Active Problem List   Diagnosis Date Noted  . 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 Asthma 06/15/2019  . Alcohol use 8 shots Henessey 04/2019 06/15/2019  . Depression, major, single episode, complete remission ([place]) dx'd 2016 06/15/2019  . Adjustment disorder with mixed anxiety and depressed mood 02/28/2019  . Tobacco use disorder 10-12 cpd 06/16/2018  . Cocaine use disorder, moderate, dependence (HCC)--last use 04/09/19 06/15/2018  . Cannabis use disorder, moderate, dependence ([place]) 06/15/2018  . 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 Severe recurrent major depression without psychotic features ([place]) 06/14/2018  . Body mass index 28.0-28.9, adult 08/30/2015    Chief Complaint  Patient presents with  . SEXUALLY TRANSMITTED DISEASE    HPI  Patient reports that she had a clear disch with a strong odor for a couple weeks.  She states that she has had BV in the past and believes this is BV. States  That she and partner aren't using condoms or other BCM.  Last HIV test per patient/review of record was 08/2018 Patient reports last pap was 2018.   See flowsheet for further details and programmatic requirements.    The following portions of the patient's history were reviewed and updated as appropriate: allergies, current medications, past medical history, past social history, past surgical history and problem list.  Objective:  There were no vitals filed for this  visit.  Physical Exam Vitals and nursing note reviewed.  Constitutional:      Appearance: Normal appearance.  HENT:     Head: Normocephalic and atraumatic.     Mouth/Throat:     Mouth: Mucous membranes are moist.     Pharynx: Oropharynx is clear. No oropharyngeal exudate or posterior oropharyngeal erythema.  Pulmonary:     Effort: Pulmonary effort is normal.  Abdominal:     General: Abdomen is flat.     Palpations: There is no mass.     Tenderness: There is no abdominal tenderness. There is no rebound.     Hernia: There is no hernia in the left inguinal area or right inguinal area.  Genitourinary:    General: Normal vulva.     Exam position: Lithotomy position.     Pubic Area: No rash or pubic lice.      Labia:        Right: No rash or lesion.        Left: No rash or lesion.      Vagina: Vaginal discharge present. No erythema, bleeding or lesions.     Cervix: Normal.     Uterus: Normal.      Adnexa: Right adnexa normal and left adnexa normal.     Rectum: Normal.     Comments: White thick disch., pH > 4.5 Lymphadenopathy:     Head:     Right side of head: No preauricular or posterior auricular adenopathy.     Left side of head: No preauricular or posterior auricular adenopathy.     Cervical: No cervical adenopathy.  Upper Body:     Right upper body: No supraclavicular or axillary adenopathy.     Left upper body: No supraclavicular or axillary adenopathy.     Lower Body: No right inguinal adenopathy.  Skin:    General: Skin is warm and dry.     Findings: No rash.  Neurological:     Mental Status: She is alert and oriented to person, place, and time.    Assessment and Plan:  Shirley Turner is a 29 y.o. female presenting to the [facility] for STI screening  1. Screening examination for venereal disease  Treat for BV if + for clues/amine- wet prep negative. - WET PREP FOR [place], YEAST, CLUE - Chlamydia/Gonorrhea [place] -  HIV/HCV [place] - Hepatitis Serology, [facility] - Syphilis Serology, [facility] - Gonococcus culture Co. To use condoms for STD prevention.      No follow-ups on file.  No future appointments.  Hassell Done, FNP

## 2020-01-25 LAB — GONOCOCCUS CULTURE

## 2020-01-27 LAB — HEPATITIS B SURFACE ANTIGEN

## 2020-01-28 ENCOUNTER — Encounter: Payer: Self-pay | Admitting: Family Medicine

## 2020-01-28 LAB — HM HIV SCREENING LAB: HM HIV Screening: NEGATIVE

## 2020-01-28 LAB — HM HEPATITIS C SCREENING LAB: HM Hepatitis Screen: NEGATIVE

## 2020-06-06 ENCOUNTER — Other Ambulatory Visit: Payer: 59

## 2020-06-06 ENCOUNTER — Ambulatory Visit (LOCAL_COMMUNITY_HEALTH_CENTER): Payer: 59

## 2020-06-06 ENCOUNTER — Other Ambulatory Visit: Payer: Self-pay

## 2020-06-06 DIAGNOSIS — Z111 Encounter for screening for respiratory tuberculosis: Secondary | ICD-10-CM

## 2020-06-06 NOTE — Progress Notes (Signed)
Pt to [facility] for PPDR. Pt provided documentation of required placement details: placed by [facility] in left forearm on 06/03/20 - see scanned copy. Read as 0 mm Negative by [facility] on 06/06/20.

## 2021-01-03 ENCOUNTER — Ambulatory Visit (LOCAL_COMMUNITY_HEALTH_CENTER): Payer: Self-pay

## 2021-01-03 ENCOUNTER — Other Ambulatory Visit: Payer: Self-pay

## 2021-01-03 VITALS — BP 114/79 | Ht 63.0 in | Wt 155.5 lb

## 2021-01-03 DIAGNOSIS — Z3201 Encounter for pregnancy test, result positive: Secondary | ICD-10-CM

## 2021-01-03 LAB — PREGNANCY, URINE: Preg Test, Ur: POSITIVE — AB

## 2021-01-03 MED ORDER — PRENATAL 27-0.8 MG PO TABS: 1.0000 | ORAL_TABLET | Freq: Every day | ORAL | 0 refills | Status: AC

## 2021-01-03 NOTE — Progress Notes (Signed)
UPT positive. Plans prenatal care at [facility]. Reports left sided cramping that comes and goes. Advised to seek immediate medical attention if pain does not improve after hydration and rest or if bleeding. Pt states ongoing depression. PHQ 10. FOB supportive. Does not have counselor. Denies thoughts of harming self/others. Accepted Vickey Huger, LCSW contact card today and states plans to contact her.  Declines to meet with provider today. Advised to visit DSS for Medicaid/[employer]. Josie Saunders, RN

## 2021-01-08 DIAGNOSIS — R0789 Other chest pain: Secondary | ICD-10-CM | POA: Diagnosis not present

## 2021-01-08 DIAGNOSIS — R4702 Dysphasia: Secondary | ICD-10-CM | POA: Diagnosis not present

## 2021-01-08 DIAGNOSIS — R0689 Other abnormalities of breathing: Secondary | ICD-10-CM | POA: Diagnosis not present

## 2021-01-12 ENCOUNTER — Other Ambulatory Visit: Payer: Self-pay

## 2021-01-12 ENCOUNTER — Encounter (HOSPITAL_COMMUNITY): Payer: Self-pay | Admitting: Pharmacy Technician

## 2021-01-12 ENCOUNTER — Other Ambulatory Visit: Payer: Self-pay | Admitting: Advanced Practice Midwife

## 2021-01-12 ENCOUNTER — Inpatient Hospital Stay (HOSPITAL_COMMUNITY)
Admission: AD | Admit: 2021-01-12 | Discharge: 2021-01-12 | Disposition: A | Discharge status: Home / Self Care | Payer: Medicaid Other | Attending: Obstetrics and Gynecology | Admitting: Obstetrics and Gynecology

## 2021-01-12 DIAGNOSIS — O219 Vomiting of pregnancy, unspecified: Secondary | ICD-10-CM | POA: Diagnosis not present

## 2021-01-12 DIAGNOSIS — O99321 Drug use complicating pregnancy, first trimester: Secondary | ICD-10-CM | POA: Insufficient documentation

## 2021-01-12 DIAGNOSIS — R42 Dizziness and giddiness: Secondary | ICD-10-CM | POA: Diagnosis not present

## 2021-01-12 DIAGNOSIS — F1290 Cannabis use, unspecified, uncomplicated: Secondary | ICD-10-CM | POA: Diagnosis not present

## 2021-01-12 DIAGNOSIS — F17210 Nicotine dependence, cigarettes, uncomplicated: Secondary | ICD-10-CM | POA: Diagnosis not present

## 2021-01-12 DIAGNOSIS — O99331 Smoking (tobacco) complicating pregnancy, first trimester: Secondary | ICD-10-CM | POA: Insufficient documentation

## 2021-01-12 DIAGNOSIS — O210 Mild hyperemesis gravidarum: Secondary | ICD-10-CM

## 2021-01-12 DIAGNOSIS — O26891 Other specified pregnancy related conditions, first trimester: Secondary | ICD-10-CM | POA: Insufficient documentation

## 2021-01-12 DIAGNOSIS — Z79899 Other long term (current) drug therapy: Secondary | ICD-10-CM | POA: Diagnosis not present

## 2021-01-12 DIAGNOSIS — Z3A01 Less than 8 weeks gestation of pregnancy: Secondary | ICD-10-CM | POA: Diagnosis not present

## 2021-01-12 HISTORY — DX: Anemia, unspecified: D64.9

## 2021-01-12 HISTORY — DX: Headache, unspecified: R51.9

## 2021-01-12 LAB — URINALYSIS, ROUTINE W REFLEX MICROSCOPIC
Glucose, UA: NEGATIVE mg/dL
Hgb urine dipstick: NEGATIVE
Ketones, ur: 80 mg/dL — AB
Leukocytes,Ua: NEGATIVE
Nitrite: NEGATIVE
Protein, ur: 30 mg/dL — AB
Specific Gravity, Urine: 1.03 (ref 1.005–1.030)
pH: 7 (ref 5.0–8.0)

## 2021-01-12 LAB — CBC WITH DIFFERENTIAL/PLATELET
Abs Immature Granulocytes: 0.03 10*3/uL (ref 0.00–0.07)
Basophils Absolute: 0 10*3/uL (ref 0.0–0.1)
Basophils Relative: 0 %
Eosinophils Absolute: 0.1 10*3/uL (ref 0.0–0.5)
Eosinophils Relative: 1 %
HCT: 42.6 % (ref 36.0–46.0)
Hemoglobin: 14.4 g/dL (ref 12.0–15.0)
Immature Granulocytes: 0 %
Lymphocytes Relative: 36 %
Lymphs Abs: 2.9 10*3/uL (ref 0.7–4.0)
MCH: 31.3 pg (ref 26.0–34.0)
MCHC: 33.8 g/dL (ref 30.0–36.0)
MCV: 92.6 fL (ref 80.0–100.0)
Monocytes Absolute: 0.6 10*3/uL (ref 0.1–1.0)
Monocytes Relative: 8 %
Neutro Abs: 4.3 10*3/uL (ref 1.7–7.7)
Neutrophils Relative %: 55 %
Platelets: 294 10*3/uL (ref 150–400)
RBC: 4.6 MIL/uL (ref 3.87–5.11)
RDW: 12.3 % (ref 11.5–15.5)
WBC: 7.9 10*3/uL (ref 4.0–10.5)
nRBC: 0 % (ref 0.0–0.2)

## 2021-01-12 LAB — COMPREHENSIVE METABOLIC PANEL
ALT: 50 U/L — ABNORMAL HIGH (ref 0–44)
AST: 37 U/L (ref 15–41)
Albumin: 4 g/dL (ref 3.5–5.0)
Alkaline Phosphatase: 51 U/L (ref 38–126)
Anion gap: 12 (ref 5–15)
BUN: 7 mg/dL (ref 6–20)
CO2: 22 mmol/L (ref 22–32)
Calcium: 9.4 mg/dL (ref 8.9–10.3)
Chloride: 100 mmol/L (ref 98–111)
Creatinine, Ser: 0.75 mg/dL (ref 0.44–1.00)
GFR, Estimated: >60 mL/min (ref >60)
Glucose, Bld: 85 mg/dL (ref 70–99)
Potassium: 3.3 mmol/L — ABNORMAL LOW (ref 3.5–5.1)
Sodium: 134 mmol/L — ABNORMAL LOW (ref 135–145)
Total Bilirubin: 0.8 mg/dL (ref 0.3–1.2)
Total Protein: 7.7 g/dL (ref 6.5–8.1)

## 2021-01-12 LAB — LIPASE, BLOOD: Lipase: 50 U/L (ref 11–51)

## 2021-01-12 MED ORDER — ONDANSETRON HCL 4 MG/2ML IJ SOLN
4.0000 mg | Freq: Once | INTRAMUSCULAR | Status: AC
  Administered 2021-01-12: 4 mg via INTRAVENOUS
  Filled 2021-01-12: qty 2

## 2021-01-12 MED ORDER — DOXYLAMINE-PYRIDOXINE 10-10 MG PO TBEC: 2.0000 | DELAYED_RELEASE_TABLET | Freq: Every evening | ORAL | 0 refills | Status: DC | PRN

## 2021-01-12 MED ORDER — GLYCOPYRROLATE 0.2 MG/ML IJ SOLN
0.2000 mg | Freq: Once | INTRAMUSCULAR | Status: AC
  Administered 2021-01-12: 0.2 mg via INTRAVENOUS
  Filled 2021-01-12: qty 1

## 2021-01-12 MED ORDER — FAMOTIDINE IN NACL 20-0.9 MG/50ML-% IV SOLN
20.0000 mg | Freq: Once | INTRAVENOUS | Status: AC
  Administered 2021-01-12: 20 mg via INTRAVENOUS
  Filled 2021-01-12: qty 50

## 2021-01-12 MED ORDER — LACTATED RINGERS IV BOLUS
1000.0000 mL | Freq: Once | INTRAVENOUS | Status: AC
  Administered 2021-01-12: 1000 mL via INTRAVENOUS

## 2021-01-12 MED ORDER — ONDANSETRON 4 MG PO TBDP: 4.0000 mg | ORAL_TABLET | Freq: Four times a day (QID) | ORAL | 0 refills | Status: DC | PRN

## 2021-01-12 NOTE — MAU Note (Signed)
Pt has not any emesis since arrival-pt states nausea has improved and over all feels "better" following IV fluid and medication administration.  Ice chips provided for po challenge

## 2021-01-12 NOTE — MAU Provider Note (Signed)
Chief Complaint:  Emesis   Event Date/Time   First Provider Initiated Contact with Patient 01/12/21 1938     HPI: Shirley Turner is a 30 y.o. [id number] at [redacted]w[redacted]d who presents to maternity admissions reporting severe nausea/vomiting for the past two days. Was using marijuana to curb nausea but notes it has not worked this pregnancy. Has not been able to keep anything down for the past two days, also notes hypersalivation. No cramping or vaginal bleeding, no other physical symptoms but reports increased anxiety. Was on celexa "and something else" but is not taking any medications at this time. Has a history of asthma but is not using her inhaler.  Pregnancy Course: Has received care at [facility], plans to establish care at [facility]  Past Medical History:  Diagnosis Date   Anemia    Anxiety    Asthma    Depression    Headache    OB History  Gravida Para Term Preterm AB Living  3 2 0 1 0 2  SAB IAB Ectopic Multiple Live Births  0 0 0 0 2    # Outcome Date GA Lbr Len/2nd Weight Sex Delivery Anes PTL Lv  3 Current           2 Preterm 01/30/16 [redacted]w[redacted]d   F    LIV  1 Para 10/03/14    F Vag-Spont  N LIV   Past Surgical History:  Procedure Laterality Date   TONSILLECTOMY AND ADENOIDECTOMY     Family History  Problem Relation Age of Onset   Hypertension Mother    Depression Sister    Bipolar disorder Sister    Diabetes Maternal Grandmother    Hypertension Maternal Grandmother    Kidney disease Maternal Grandmother    Congestive Heart Failure Maternal Grandmother    Diabetes Paternal Grandmother    Hypertension Paternal Grandmother    Arthritis Paternal Grandmother    ADD / ADHD Half-Sister    Social History   Tobacco Use   Smoking status: Every Day    Packs/day: 0.25    Pack years: 0.00    Types: Cigarettes   Smokeless tobacco: Never  Vaping Use   Vaping Use: Never used  Substance Use Topics   Alcohol use: Yes    Alcohol/week: 8.0 standard drinks    Types: 8 Shots  of liquor per week    Comment: last etoh- last week "cup" of liquor.   8 shots of [place] 04/2019   Drug use: Yes    Types: Marijuana, Cocaine    Comment: smokes marijuana daily, last cocaine 2020, ecstacy   No Known Allergies Medications Prior to Admission  Medication Sig Dispense Refill Last Dose   Prenatal Vit-Fe Fumarate-FA (MULTIVITAMIN-PRENATAL) 27-0.8 MG TABS tablet Take 1 tablet by mouth daily at 12 noon. 100 tablet 0 Past Week   albuterol (VENTOLIN HFA) 108 (90 Base) MCG/ACT inhaler Inhale 2 puffs into the lungs every 6 (six) hours as needed for wheezing or shortness of breath. (Patient not taking: Reported on 01/03/2021) 1 Inhaler 2    albuterol (VENTOLIN HFA) 108 (90 Base) MCG/ACT inhaler Inhale 1-2 puffs into the lungs every 6 (six) hours as needed for wheezing or shortness of breath. (Patient not taking: Reported on 01/03/2021) 18 g 0    citalopram (CELEXA) 20 MG tablet Take 1 tablet (20 mg total) by mouth daily. 30 tablet 2    gabapentin (NEURONTIN) 100 MG capsule Take 1 capsule (100 mg total) by mouth 3 (three) times daily. (  Patient not taking: Reported on 01/03/2021) 90 capsule 2    HYDROcodone-acetaminophen (NORCO/VICODIN) 5-325 MG tablet Take 1 tablet by mouth every 6 (six) hours as needed for moderate pain. (Patient not taking: Reported on 01/03/2021) 10 tablet 0    hydrOXYzine (ATARAX/VISTARIL) 10 MG tablet Take 1 tablet (10 mg total) by mouth 3 (three) times daily as needed for anxiety. (Patient not taking: Reported on 01/03/2021) 30 tablet 0    hydrOXYzine (ATARAX/VISTARIL) 25 MG tablet Take 1 tablet (25 mg total) by mouth every 6 (six) hours. (Patient not taking: Reported on 01/03/2021) 12 tablet 0    traZODone (DESYREL) 100 MG tablet Take 1 tablet (100 mg total) by mouth at bedtime. (Patient not taking: Reported on 01/03/2021) 30 tablet 2    I have reviewed patient's Past Medical Hx, Surgical Hx, Family Hx, Social Hx, medications and allergies.   ROS:  Pertinent items noted in  HPI and remainder of comprehensive ROS otherwise negative.  Physical Exam  Patient Vitals for the past 24 hrs:  BP Temp Temp src Pulse Resp SpO2  01/12/21 1910 133/61 99.1 F (37.3 C) -- 61 16 100 %  01/12/21 1828 120/78 98.6 F (37 C) Oral (!) 102 18 100 %   Constitutional: Well-developed, well-nourished female in no acute distress but does appear somewhat altered Cardiovascular: normal rate & rhythm, no murmur Respiratory: normal effort, lung sounds clear throughout GI: Abd soft, non-tender, gravid appropriate for gestational age. Pos BS x 4 MS: Extremities nontender, no edema, normal ROM Neurologic: Alert and oriented x 4.  GU: no CVA tenderness Pelvic exam deferred   Labs: Results for orders placed or performed during the hospital encounter of 01/12/21 (from the past 24 hour(s))  Urinalysis, Routine w reflex microscopic Urine, Clean Catch     Status: Abnormal   Collection Time: 01/12/21  7:20 PM  Result Value Ref Range   Color, Urine AMBER (A) YELLOW   APPearance HAZY (A) CLEAR   Specific Gravity, Urine 1.030 1.005 - 1.030   pH 7.0 5.0 - 8.0   Glucose, UA NEGATIVE NEGATIVE mg/dL   Hgb urine dipstick NEGATIVE NEGATIVE   Bilirubin Urine SMALL (A) NEGATIVE   Ketones, ur 80 (A) NEGATIVE mg/dL   Protein, ur 30 (A) NEGATIVE mg/dL   Nitrite NEGATIVE NEGATIVE   Leukocytes,Ua NEGATIVE NEGATIVE   RBC / HPF 0-5 0 - 5 RBC/hpf   WBC, UA 0-5 0 - 5 WBC/hpf   Bacteria, UA RARE (A) NONE SEEN   Squamous Epithelial / LPF 11-20 0 - 5   Mucus PRESENT   CBC with Differential/Platelet     Status: None   Collection Time: 01/12/21  7:43 PM  Result Value Ref Range   WBC 7.9 4.0 - 10.5 K/uL   RBC 4.60 3.87 - 5.11 MIL/uL   Hemoglobin 14.4 12.0 - 15.0 g/dL   HCT 42.6 36.0 - 46.0 %   MCV 92.6 80.0 - 100.0 fL   MCH 31.3 26.0 - 34.0 pg   MCHC 33.8 30.0 - 36.0 g/dL   RDW 12.3 11.5 - 15.5 %   Platelets 294 150 - 400 K/uL   nRBC 0.0 0.0 - 0.2 %   Neutrophils Relative % 55 %   Neutro Abs 4.3  1.7 - 7.7 K/uL   Lymphocytes Relative 36 %   Lymphs Abs 2.9 0.7 - 4.0 K/uL   Monocytes Relative 8 %   Monocytes Absolute 0.6 0.1 - 1.0 K/uL   Eosinophils Relative 1 %   Eosinophils Absolute  0.1 0.0 - 0.5 K/uL   Basophils Relative 0 %   Basophils Absolute 0.0 0.0 - 0.1 K/uL   Immature Granulocytes 0 %   Abs Immature Granulocytes 0.03 0.00 - 0.07 K/uL  Comprehensive metabolic panel     Status: Abnormal   Collection Time: 01/12/21  7:43 PM  Result Value Ref Range   Sodium 134 (L) 135 - 145 mmol/L   Potassium 3.3 (L) 3.5 - 5.1 mmol/L   Chloride 100 98 - 111 mmol/L   CO2 22 22 - 32 mmol/L   Glucose, Bld 85 70 - 99 mg/dL   BUN 7 6 - 20 mg/dL   Creatinine, Ser 0.75 0.44 - 1.00 mg/dL   Calcium 9.4 8.9 - 10.3 mg/dL   Total Protein 7.7 6.5 - 8.1 g/dL   Albumin 4.0 3.5 - 5.0 g/dL   AST 37 15 - 41 U/L   ALT 50 (H) 0 - 44 U/L   Alkaline Phosphatase 51 38 - 126 U/L   Total Bilirubin 0.8 0.3 - 1.2 mg/dL   GFR, Estimated >60 >60 mL/min   Anion gap 12 5 - 15  Lipase, blood     Status: None   Collection Time: 01/12/21  7:43 PM  Result Value Ref Range   Lipase 50 11 - 51 U/L   Imaging:  No results found.  MAU Course: Orders Placed This Encounter  Procedures   Urinalysis, Routine w reflex microscopic Urine, Clean Catch   CBC with Differential/Platelet   Comprehensive metabolic panel   Lipase, blood   Urine rapid drug screen (hosp performed)   Discharge patient   Meds ordered this encounter  Medications   lactated ringers bolus 1,000 mL   ondansetron (ZOFRAN) injection 4 mg   famotidine (PEPCID) IVPB 20 mg premix   glycopyrrolate (ROBINUL) injection 0.2 mg   MDM: LR bolus + IV zofran and IVPB pepcid given with good relief of nausea, robinul added for hypersalivation. Successful PO test, pt ready for discharge. Will let [facility] manage anxiety meds.  Assessment: 1. Nausea and vomiting in pregnancy   2. [redacted] weeks gestation of pregnancy     Plan: Discharge home in stable  condition with return precautions   Follow-up [facility]. Go to.   Why: as scheduled for ongoing prenatal care Contact information: [address] [place] [address] [phone]                Allergies as of 01/12/2021   No Known Allergies      Medication List     STOP taking these medications    albuterol 108 (90 Base) MCG/ACT inhaler Commonly known as: VENTOLIN HFA   citalopram 20 MG tablet Commonly known as: CeleXA   gabapentin 100 MG capsule Commonly known as: NEURONTIN   HYDROcodone-acetaminophen 5-325 MG tablet Commonly known as: NORCO/VICODIN   hydrOXYzine 10 MG tablet Commonly known as: ATARAX/VISTARIL   hydrOXYzine 25 MG tablet Commonly known as: ATARAX/VISTARIL   traZODone 100 MG tablet Commonly known as: DESYREL       TAKE these medications    multivitamin-prenatal 27-0.8 MG Tabs tablet Take 1 tablet by mouth daily at 12 noon.        Gaylan Gerold, CNM, MSN, [place] Certified Nurse Midwife, [facility]

## 2021-01-12 NOTE — MAU Note (Signed)
Pt reports no appetite for 2 days. Pt reports not being able to keep anything down for two days. Pt reports that she feels dizzy and has been seeing black spot. Pt reports she has anxiety.

## 2021-01-12 NOTE — ED Provider Notes (Signed)
Emergency Medicine Provider OB Triage Evaluation Note  Nori Winegar is a 30 y.o. female, [id number], at [redacted]w[redacted]d gestation who presents to the emergency department with complaints of nausea, vomiting, intermittent dizziness and fatigue.  No cough, chest pain or shortness of breath.  She has a positive pregnancy test in the system viewable from [facility]..  Review of  Systems  Positive: Dizziness, fatigue, nausea, vomiting Negative: Chest pain, shortness of breath, COVID contacts  Physical Exam  BP 120/78 (BP Location: Right Arm)   Pulse (!) 102   Temp 98.6 F (37 C) (Oral)   Resp 18   LMP 11/24/2020 (Exact Date) Comment: LMP heavier than normal  SpO2 100%  General: Awake, no distress  HEENT: Atraumatic  Resp: Normal effort  Cardiac: Normal rate Abd: Nondistended, nontender  MSK: Moves all extremities without difficulty Neuro: Speech clear  Medical Decision Making  Pt evaluated for pregnancy concern and is stable for transfer to MAU. Pt is in agreement with plan for transfer.  6:41 PM Discussed with MAU APP, Elmyra Ricks, who accepts patient in transfer.  Clinical Impression   1. Non-intractable vomiting with nausea, unspecified vomiting type        Ollen Gross 01/12/21 1841    Wyvonnia Dusky, MD 01/13/21 Benancio Deeds

## 2021-01-12 NOTE — ED Triage Notes (Signed)
Pt reports being [redacted] weeks pregnant and has not been able to tolerate PO intake for 2 days. Pt endorses intermittent dizziness and fatigue.

## 2021-01-12 NOTE — Progress Notes (Signed)
Never got antiemetics prescribed in MAU  Will send Rx to pharmacy

## 2021-01-25 ENCOUNTER — Encounter: Payer: Self-pay | Admitting: Obstetrics and Gynecology

## 2021-02-09 ENCOUNTER — Encounter: Payer: Self-pay | Admitting: Advanced Practice Midwife

## 2021-02-09 ENCOUNTER — Ambulatory Visit (INDEPENDENT_AMBULATORY_CARE_PROVIDER_SITE_OTHER): Payer: Medicaid Other | Admitting: Advanced Practice Midwife

## 2021-02-09 ENCOUNTER — Other Ambulatory Visit (HOSPITAL_COMMUNITY)
Admission: RE | Admit: 2021-02-09 | Discharge: 2021-02-09 | Disposition: A | Discharge status: Home / Self Care | Payer: Medicaid Other | Source: Ambulatory Visit | Attending: Obstetrics and Gynecology | Admitting: Obstetrics and Gynecology

## 2021-02-09 ENCOUNTER — Other Ambulatory Visit: Payer: Self-pay

## 2021-02-09 VITALS — BP 85/54 | HR 102 | Wt 154.0 lb

## 2021-02-09 DIAGNOSIS — Z113 Encounter for screening for infections with a predominantly sexual mode of transmission: Secondary | ICD-10-CM

## 2021-02-09 DIAGNOSIS — F1991 Other psychoactive substance use, unspecified, in remission: Secondary | ICD-10-CM

## 2021-02-09 DIAGNOSIS — Z124 Encounter for screening for malignant neoplasm of cervix: Secondary | ICD-10-CM | POA: Insufficient documentation

## 2021-02-09 DIAGNOSIS — Z87898 Personal history of other specified conditions: Secondary | ICD-10-CM

## 2021-02-09 DIAGNOSIS — Z369 Encounter for antenatal screening, unspecified: Secondary | ICD-10-CM | POA: Insufficient documentation

## 2021-02-09 DIAGNOSIS — Z3481 Encounter for supervision of other normal pregnancy, first trimester: Secondary | ICD-10-CM

## 2021-02-09 DIAGNOSIS — Z3A11 11 weeks gestation of pregnancy: Secondary | ICD-10-CM

## 2021-02-09 DIAGNOSIS — Z1379 Encounter for other screening for genetic and chromosomal anomalies: Secondary | ICD-10-CM

## 2021-02-09 DIAGNOSIS — Z130 Encounter for screening for diseases of the blood and blood-forming organs and certain disorders involving the immune mechanism: Secondary | ICD-10-CM

## 2021-02-09 DIAGNOSIS — Z1159 Encounter for screening for other viral diseases: Secondary | ICD-10-CM

## 2021-02-09 LAB — HEPATITIS C ANTIBODY: HCV Ab: NEGATIVE

## 2021-02-09 NOTE — Progress Notes (Signed)
Confirmed at [facility] Department

## 2021-02-09 NOTE — Patient Instructions (Signed)
[email or web address]  Prenatal Care Prenatal care is health care during pregnancy. It helps you and your unborn baby (fetus) stay as healthy as possible. Prenatal care may be provided by a midwife, a family practice doctor, a IT consultant (nurse practitioner or physician assistant), or a childbirth and pregnancy doctor (obstetrician). How does this affect me? During pregnancy, you will be closely monitored for any new conditions that might develop. To lower your risk of pregnancy complications, you and yourhealth care provider will talk about any underlying conditions you have. How does this affect my baby? Early and consistent prenatal care increases the chance that your baby will be healthy during pregnancy. Prenatal care lowers the risk that your baby will be: Born early (prematurely). Smaller than expected at birth (small for gestational age). What can I expect at the first prenatal care visit? Your first prenatal care visit will likely be the longest. You should schedule your first prenatal care visit as soon as you know that you are pregnant. Your first visit is a good time to talk about any questions or concerns you haveabout pregnancy. Medical history At your visit, you and your health care provider will talk about your medical history, including: Any past pregnancies. Your family's medical history. Medical history of the baby's father. Any long-term (chronic) health conditions you have and how you manage them. Any surgeries or procedures you have had. Any current over-the-counter or prescription medicines, herbs, or supplements that you are taking. Other factors that could pose a risk to your baby, including: Exposure to harmful chemicals or radiation at work or at home. Any substance use, including tobacco, alcohol, and drug use. Your home setting and your stress levels, including: Exposure to abuse or  violence. Household financial strain. Your daily health habits, including diet and exercise. Tests and screenings Your health care provider will: Measure your weight, height, and blood pressure. Do a physical exam, including a pelvic and breast exam. Perform blood tests and urine tests to check for: Urinary tract infection. Sexually transmitted infections (STIs). Low iron levels in your blood (anemia). Blood type and certain proteins on red blood cells (Rh antibodies). Infections and immunity to viruses, such as hepatitis B and rubella. HIV (human immunodeficiency virus). Discuss your options for genetic screening. Tips about staying healthy Your health care provider will also give you information about how to keep yourself and your baby healthy, including: Nutrition and taking vitamins. Physical activity. How to manage pregnancy symptoms such as nausea and vomiting (morning sickness). Infections and substances that may be harmful to your baby and how to avoid them. Food safety. Dental care. Working. Travel. Warning signs to watch for and when to call your health care provider. How often will I have prenatal care visits? After your first prenatal care visit, you will have regular visits throughout your pregnancy. The visit schedule is often as follows: Up to week 28 of pregnancy: once every 4 weeks. 28-36 weeks: once every 2 weeks. After 36 weeks: every week until delivery. Some women may have visits more or less often depending on any underlyinghealth conditions and the health of the baby. Keep all follow-up and prenatal care visits. This is important. What happens during routine prenatal care visits? Your health care provider will: Measure your weight and blood pressure. Check for fetal heart sounds. Measure the height of your uterus in your abdomen (fundal height). This may be measured starting around week 20 of pregnancy. Check the position of your baby inside your  uterus. Ask questions  about your diet, sleeping patterns, and whether you can feel the baby move. Review warning signs to watch for and signs of labor. Ask about any pregnancy symptoms you are having and how you are dealing with them. Symptoms may include: Headaches. Nausea and vomiting. Vaginal discharge. Swelling. Fatigue. Constipation. Changes in your vision. Feeling persistently sad or anxious. Any discomfort, including back or pelvic pain. Bleeding or spotting. Make a list of questions to ask your health care provider at your routinevisits. What tests might I have during prenatal care visits? You may have blood, urine, and imaging tests throughout your pregnancy, such as: Urine tests to check for glucose, protein, or signs of infection. Glucose tests to check for a form of diabetes that can develop during pregnancy (gestational diabetes mellitus). This is usually done around week 24 of pregnancy. Ultrasounds to check your baby's growth and development, to check for birth defects, and to check your baby's well-being. These can also help to decide when you should deliver your baby. A test to check for group B strep (GBS) infection. This is usually done around week 36 of pregnancy. Genetic testing. This may include blood, fluid, or tissue sampling, or imaging tests, such as an ultrasound. Some genetic tests are done during the first trimester and some are done during the second trimester. What else can I expect during prenatal care visits? Your health care provider may recommend getting certain vaccines during pregnancy. These may include: A yearly flu shot (annual influenza vaccine). This is especially important if you will be pregnant during flu season. Tdap (tetanus, diphtheria, pertussis) vaccine. Getting this vaccine during pregnancy can protect your baby from whooping cough (pertussis) after birth. This vaccine may be recommended between weeks 27 and 36 of pregnancy. A COVID-19  vaccine. Later in your pregnancy, your health care provider may give you information about: Childbirth and breastfeeding classes. Choosing a health care provider for your baby. Umbilical cord banking. Breastfeeding. Birth control after your baby is born. The hospital labor and delivery unit and how to set up a tour. Registering at the hospital before you go into labor. Where to find more information Office on Women's Health: LegalWarrants.gl American Pregnancy Association: americanpregnancy.org March of Dimes: marchofdimes.org Summary Prenatal care helps you and your baby stay as healthy as possible during pregnancy. Your first prenatal care visit will most likely be the longest. You will have visits and tests throughout your pregnancy to monitor your health and your baby's health. Bring a list of questions to your visits to ask your health care provider. Make sure to keep all follow-up and prenatal care visits. This information is not intended to replace advice given to you by your health care provider. Make sure you discuss any questions you have with your healthcare provider. Document Revised: 04/21/2020 Document Reviewed: 04/21/2020 Elsevier Patient Education  [place]. Exercise During Pregnancy Exercise is an important part of being healthy for people of all ages. Exercise improves the function of your heart and lungs and helps you maintain strength, flexibility, and a healthy body weight. Exercise also boosts energy levels andelevates mood. Most women should exercise regularly during pregnancy. Exercise routines may need to change as your pregnancy progresses. In rare cases, women with certain medical conditions or complications may be asked to limit or avoid exercise during pregnancy. Your health care provider will give you information on whatwill work for you. How does this affect me? Along with maintaining general strength and flexibility, exercising during pregnancy can  help: Keep  strength in muscles that are used during labor and childbirth. Decrease low back pain or symptoms of depression. Control weight gain during pregnancy. Reduce the risk of needing insulin if you develop diabetes during pregnancy. Decrease the risk of cesarean delivery. Speed up your recovery after giving birth. Relieve constipation. How does this affect my baby? Exercise can help you have a healthy pregnancy. Exercise does not cause early (premature) birth. It will not cause your baby to weigh less at birth. What exercises can I do? Many exercises are safe for you to do during pregnancy. Do a variety of exercises that safely increase your heart and breathing rates and help you build and maintain muscle strength. Do exercises exactly as told by your health care provider. You may do these exercises: Walking. Swimming. Water aerobics. Riding a stationary bike. Modified yoga or Pilates. Tell your instructor that you are pregnant. Avoid overstretching, and avoid lying on your back for long periods of time. Running or jogging. Choose this type of exercise only if: You ran or jogged regularly before your pregnancy. You can run or jog and still talk in complete sentences. What exercises should I avoid? You may be told to limit high-intensity exercise depending on your level of fitness and whether you exercised regularly before you were pregnant. You can tell that you are exercising at a high intensity if you are breathing much harder and faster and cannot hold a conversation while exercising. You must avoid: Contact sports. Activities that put you at risk for falling on or being hit in the belly, such as downhill skiing, waterskiing, surfing, rock climbing, cycling, gymnastics, and horseback riding. Scuba diving. Skydiving. Hot yoga or hot Pilates. These activities take place in a room that is heated to high temperatures. Jogging or running, unless you jogged or ran regularly before you  were pregnant. While jogging or running, you should always be able to talk in full sentences. Do not run or jog so fast that you are unable to have a conversation. Do not exercise at more than 6,000 feet above sea level (high elevation) if you are not used to exercising at high elevation. How do I exercise in a safe way?  Avoid overheating. Do not exercise in very high temperatures. Wear loose-fitting, breathable clothes. Avoid dehydration. Drink enough fluid before, during, and after exercise to keep your urine pale yellow. Avoid overstretching. Because of hormone changes during pregnancy, it is easy to overstretch muscles, tendons, and ligaments. Start slowly and ask your health care provider to recommend the types of exercise that are safe for you. Do not exercise to lose weight. Wear a sports bra to support your breasts. Avoid standing still or lying flat on your back as much as you can. Follow these instructions at home: Exercise on most days or all days of the week. Try to exercise for 30 minutes a day, 5 days a week, unless your health care provider tells you not to. If you actively exercised before your pregnancy and you are healthy, your health care provider may tell you to continue to do moderate-intensity to high-intensity exercise. If you are just starting to exercise or did not exercise much before your pregnancy, your health care provider may tell you to do low-intensity to moderate-intensity exercise. Questions to ask your health care provider Is exercise safe for me? What are signs that I should stop exercising? Does my health condition mean that I should not exercise during pregnancy? When should I avoid exercising during pregnancy? Stop  exercising and contact a health care provider if: You have any unusual symptoms such as: Mild contractions of the uterus or cramps in the abdomen. A dizzy feeling that does not go away when you rest. Stop exercising and get help right away  if: You have any unusual symptoms such as: Sudden, severe pain in your low back or your belly. Regular, painful contractions of your uterus. Chest pain. Bleeding or fluid leaking from your vagina. Shortness of breath. Headache. Pain and swelling of your calves. Summary Most women should exercise regularly throughout pregnancy. In rare cases, women with certain medical conditions or complications may be asked to limit or avoid exercise during pregnancy. Do not exercise to lose weight during pregnancy. Your health care provider will tell you what level of physical activity is right for you. Stop exercising and contact a health care provider if you have unusual symptoms, such as mild contractions or dizziness. This information is not intended to replace advice given to you by your health care provider. Make sure you discuss any questions you have with your healthcare provider. Document Revised: 02/24/2020 Document Reviewed: 02/24/2020 Elsevier Patient Education  [place] for Pregnant Women While you are pregnant, your body requires additional nutrition to help support your growing baby. You also have a higher need for some vitamins and minerals, such as folic acid, calcium, iron, and vitamin D. Eating a healthy, well-balanced diet is very important for your health and your baby's health. Your need for extra calories varies over the course of your pregnancy. Pregnancy is divided into three trimesters, with each trimester lasting 3 months. For most women, it is recommended to consume: 150 extra calories a day during the first trimester. 300 extra calories a day during the second trimester. 300 extra calories a day during the third trimester. What are tips for following this plan? Cooking Practice good food safety and cleanliness. Wash your hands before you eat and after you prepare raw meat. Wash all fruits and vegetables well before peeling or eating. Taking these actions  can help to prevent foodborne illnesses that can be very dangerous to your baby, such as listeriosis. Ask your health care provider for more information about listeriosis. Make sure that all meats, poultry, and eggs are cooked to food-safe temperatures or "well-done." Meal planning  Eat a variety of foods (especially fruits and vegetables) to get a full range of vitamins and minerals. Two or more servings of fish are recommended each week in order to get the most benefits from omega-3 fatty acids that are found in seafood. Choose fish that are lower in mercury, such as salmon and pollock. Limit your overall intake of foods that have "empty calories." These are foods that have little nutritional value, such as sweets, desserts, candies, and sugar-sweetened beverages. Drinks that contain caffeine are okay to drink, but it is better to avoid caffeine. Keep your total caffeine intake to less than 200 mg each day (which is 12 oz or 355 mL of coffee, tea, or soda) or the limit as told by your health care provider.  General information Do not try to lose weight or go on a diet during pregnancy. Take a prenatal vitamin to help meet your additional vitamin and mineral needs during pregnancy, specifically for folic acid, iron, calcium, and vitamin D. Remember to stay active. Ask your health care provider what types of exercise and activities are safe for you. What does 150 extra calories look like? Healthy options that provide 150  extra calories each day could be any of the following: 6-8 oz (170-227 g) plain low-fat yogurt with  cup (70 g) berries. 1 apple with 2 tsp (11 g) peanut butter. Cut-up vegetables with  cup (60 g) hummus. 8 fl oz (237 mL) low-fat chocolate milk. 1 stick of string cheese with 1 medium orange. 1 peanut butter and jelly sandwich that is made with one slice of whole-wheat bread and 1 tsp (5 g) of peanut butter. For 300 extra calories, you could eat two of these healthy options  each day. What is a healthy amount of weight to gain? The right amount of weight gain for you is based on your BMI (body mass index) before you became pregnant. If your BMI was less than 18 (underweight), you should gain 28-40 lb (13-18 kg). If your BMI was 18-24.9 (normal), you should gain 25-35 lb (11-16 kg). If your BMI was 25-29.9 (overweight), you should gain 15-25 lb (7-11 kg). If your BMI was 30 or greater (obese), you should gain 11-20 lb (5-9 kg). What if I am having twins or multiples? Generally, if you are carrying twins or multiples: You may need to eat 300-600 extra calories a day. The recommended range for total weight gain is 25-54 lb (11-25 kg), depending on your BMI before pregnancy. Talk with your health care provider to find out about nutritional needs, weight gain, and exercise that is right for you. What foods should I eat?  Fruits All fruits. Eat a variety of colors and types of fruit. Remember to wash yourfruits well before peeling or eating. Vegetables All vegetables. Eat a variety of colors and types of vegetables. Remember towash your vegetables well before peeling or eating. Grains All grains. Choose whole grains, such as whole-wheat bread, oatmeal, or brownrice. Meats and other protein foods Lean meats, including chicken, [place], and lean cuts of beef, veal, or pork. Fish that is higher in omega-3 fatty acids and lower in mercury, such as salmon, herring, mussels, trout, sardines, pollock, shrimp, crab, and lobster.Tofu. Tempeh. Beans. Eggs. Peanut butter and other nut butters. Dairy Pasteurized milk and milk alternatives, such as almond milk. Pasteurized yogurtand pasteurized cheese. Cottage cheese. Sour cream. Beverages Water. Juices that contain 100% fruit juice or vegetable juice. Caffeine-freeteas and decaffeinated coffee. Fats and oils Fats and oils are okay to include in moderation. Sweets and desserts Sweets and desserts are okay to include in  moderation. Seasoning and other foods All pasteurized condiments. The items listed above may not be a complete list of foods and beverages you can eat. Contact a dietitian for more information. What foods should I avoid? Fruits Raw (unpasteurized) fruit juices. Vegetables Unpasteurized vegetable juices. Meats and other protein foods Precooked or cured meat, such as bologna, hot dogs, sausages, or meat loaves. (If you must eat those meats, reheat them until they are steaming hot.) Refrigerated pate, meat spreads from a meat counter, or smoked seafood that is found in the refrigerated section of a store. Raw or undercooked meats, poultry, and eggs. Raw fish, such as sushi or sashimi. Fish that have highmercury content, such as tilefish, shark, swordfish, and king mackerel. Dairy Unpasteurized milk and any foods that have unpasteurized milk in them. Soft cheeses, such as feta, queso blanco, queso fresco, [place], Camembert, panela, and blue-veined cheeses (unless they are made with pasteurized milk, which must bestated on the label). Beverages Alcohol. Sugar-sweetened beverages, such as sodas, teas, or energy drinks. Seasoning and other foods Homemade fermented foods and drinks, such as  pickles, sauerkraut, or kombuchadrinks. (Store-bought pasteurized versions of these are okay.) Salads that are made in a store or deli, such as ham salad, chicken salad, eggsalad, tuna salad, and seafood salad. The items listed above may not be a complete list of foods and beverages you should avoid. Contact a dietitian for more information. Where to find more information To calculate the number of calories you need based on your height, weight, and activity level, you can use an online calculator such as: https://www.hunter.com/ To calculate how much weight you should gain during pregnancy, you can use an online pregnancy weight gain calculator such as: MassVoice.es To learn more about eating fish during  pregnancy, talk with your health care provider or visit: [place].[place] Summary While you are pregnant, your body requires additional nutrition to help support your growing baby. Eat a variety of foods, especially fruits and vegetables, to get a full range of vitamins and minerals. Practice good food safety and cleanliness. Wash your hands before you eat and after you prepare raw meat. Wash all fruits and vegetables well before peeling or eating. Taking these actions can help to prevent foodborne illnesses, such as listeriosis, that can be very dangerous to your baby. Do not eat raw meat or fish. Do not eat fish that have high mercury content, such as tilefish, shark, swordfish, and king mackerel. Do not eat raw (unpasteurized) dairy. Take a prenatal vitamin to help meet your additional vitamin and mineral needs during pregnancy, specifically for folic acid, iron, calcium, and vitamin D. This information is not intended to replace advice given to you by your health care provider. Make sure you discuss any questions you have with your healthcare provider. Document Revised: 02/04/2020 Document Reviewed: 02/04/2020 Elsevier Patient Education  [place].

## 2021-02-11 ENCOUNTER — Encounter: Payer: Self-pay | Admitting: Advanced Practice Midwife

## 2021-02-11 DIAGNOSIS — Z3490 Encounter for supervision of normal pregnancy, unspecified, unspecified trimester: Secondary | ICD-10-CM | POA: Insufficient documentation

## 2021-02-11 DIAGNOSIS — Z3480 Encounter for supervision of other normal pregnancy, unspecified trimester: Secondary | ICD-10-CM | POA: Insufficient documentation

## 2021-02-11 LAB — URINE CULTURE

## 2021-02-11 NOTE — Progress Notes (Signed)
New Obstetric Patient H&P  Date of Service: 02/09/2021  Chief Complaint: "Desires prenatal care"   History of Present Illness: Patient is a 30 y.o. [id number] Not Hispanic or [place] female, presents with amenorrhea and positive home pregnancy test. Patient's last menstrual period was 11/24/2020 (exact date). and based on her  LMP, her EDD is Estimated Date of Delivery: 08/31/21 and her EGA is [redacted]w[redacted]d Cycles are 5 days, regular, and occur approximately every : 28 days. Her last pap smear was 4 years ago and was no abnormalities.    She had a urine pregnancy test which was positive 6 or 7 week(s)  ago. Her last menstrual period was normal and lasted for  5 day(s). Since her LMP she claims she has experienced breast tenderness, fatigue, nausea, vomiting. She denies vaginal bleeding. Her past medical history is contributory for substance abuse. Her prior pregnancies are notable for  2 prior SVDs; 2016, 2017, females, SGA at term.  Since her LMP, she admits to the use of tobacco products  yes She claims she has lost 2 pounds since the start of her pregnancy.  There are cats in the home in the home  no  She admits close contact with children on a regular basis  yes  She has had chicken pox in the past yes She has had Tuberculosis exposures, symptoms, or previously tested positive for TB   no Current or past history of domestic violence. no  Genetic Screening/Teratology Counseling: (Includes patient, baby's father, or anyone in either family with:)   [facility] 2. Thalassemia ([place] or [place] background): MCV<80  no 3. Neural tube defect (meningomyelocele, spina bifida, anencephaly)  no 4. Congenital heart defect  no  5. Down syndrome  no 6. Tay-Sachs (Jewish, [place]  no 7. Canavan's Disease  no 8. Sickle cell disease or trait (African)  no  9. Hemophilia or other blood disorders  no  10. Muscular dystrophy  no  11. Cystic fibrosis  no   12. Huntington's Chorea  no  13. Mental retardation/autism  no 14. Other inherited genetic or chromosomal disorder  no 15. Maternal metabolic disorder (DM, PKU, etc)  no 16. Patient or FOB with a child with a birth defect not listed above no  16a. Patient or FOB with a birth defect themselves no 17. Recurrent pregnancy loss, or stillbirth  no  18. Any medications since LMP other than prenatal vitamins (include vitamins, supplements, OTC meds, drugs, alcohol)  she admits daily MJ use for appetite, cessation encouraged 19. Any other genetic/environmental exposure to discuss  no  Infection History:   1. Lives with someone with TB or TB exposed  no  2. Patient or partner has history of genital herpes  she has herpes labialis 3. Rash or viral illness since LMP  no 4. History of STI (GC, CT, HPV, syphilis, HIV)  GC/CT tx'ed 5. History of recent travel :  no  Other pertinent information:  no     Review of Systems:10 point review of systems negative unless otherwise noted in HPI  Past Medical History:  Patient Active Problem List   Diagnosis Date Noted   Supervision of normal pregnancy 02/11/2021     Nursing Staff Provider  Office Location  [facility] Dating    Language  English Anatomy [place]   Flu Vaccine   Genetic Screen  NIPS:   TDaP vaccine    Hgb A1C or  GTT Early :  NA Third trimester :   Covid    LAB RESULTS   Rhogam   Blood Type     Feeding Plan Breast Antibody    Contraception  Rubella    Circumcision  RPR     Pediatrician   HBsAg     Support Person Thomas HIV    Prenatal Classes  Varicella     GBS  (For PCN allergy, check sensitivities)   BTL Consent     VBAC Consent  Pap      Hgb Electro    Pelvis Tested 5#14oz CF      SMA            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   Asthma 06/15/2019   Alcohol use 8 shots Henessey 04/2019 06/15/2019   Depression, major, single episode, complete remission ([place]) dx'd 2016 06/15/2019   Adjustment disorder with  mixed anxiety and depressed mood 02/28/2019   Tobacco use disorder 10-12 cpd 06/16/2018   Cocaine use disorder, moderate, dependence (HCC)--last use 04/09/19 06/15/2018   Cannabis use disorder, moderate, dependence ([place]) 06/15/2018   Severe recurrent major depression without psychotic features ([place]) 06/14/2018    Past Surgical History:  Past Surgical History:  Procedure Laterality Date   TONSILLECTOMY AND ADENOIDECTOMY      Gynecologic History: Patient's last menstrual period was 11/24/2020 (exact date).  Obstetric History: [id number]  Family History:  Family History  Problem Relation Age of Onset   Hypertension Mother    Depression Sister    Bipolar disorder Sister    Diabetes Maternal Grandmother    Hypertension Maternal Grandmother    Kidney disease Maternal Grandmother    Congestive Heart Failure Maternal Grandmother    Diabetes Paternal Grandmother    Hypertension Paternal Grandmother    Arthritis Paternal Grandmother    ADD / ADHD Half-Sister     Social History:  Social History   Socioeconomic History   Marital status: Single    Spouse name: Not on file   Number of children: Not on file   Years of education: Not on file   Highest education level: Not on file  Occupational History   Not on file  Tobacco Use   Smoking status: Every Day    Packs/day: 0.25    Types: Cigarettes   Smokeless tobacco: Never  Vaping Use   Vaping Use: Never used  Substance and Sexual Activity   Alcohol use: Not Currently    Alcohol/week: 8.0 standard drinks    Types: 8 Shots of liquor per week    Comment: last etoh- last week "cup" of liquor.   8 shots of Henessey 04/2019   Drug use: Yes    Types: Marijuana    Comment: smokes marijuana daily, last cocaine 2020, ecstacy   Sexual activity: Yes    Partners: Male    Birth control/protection: None    Comment: hx depo- 7 yrs ago, currently no bcm  Other Topics Concern   Not on file  Social History Narrative   Not on file   Social  Determinants of Health   Financial Resource Strain: Not on file  Food Insecurity: Not on file  Transportation Needs: Not on file  Physical Activity: Not on file  Stress: Not on file  Social Connections: Not on file  Intimate Partner Violence: Not At Risk   Fear of Current or Ex-Partner: No   Emotionally Abused: No   Physically Abused: No   Sexually Abused: No    Allergies:  No Known  Allergies  Medications: Prior to Admission medications   Medication Sig Start Date End Date Taking? Authorizing Provider  Doxylamine-Pyridoxine (DICLEGIS) 10-10 MG TBEC Take 2 tablets by mouth at bedtime as needed. 01/12/21  Yes Seabron Spates, CNM  ondansetron (ZOFRAN ODT) 4 MG disintegrating tablet Take 1 tablet (4 mg total) by mouth every 6 (six) hours as needed for nausea. 01/12/21  Yes Seabron Spates, CNM  Prenatal Vit-Fe Fumarate-FA (MULTIVITAMIN-PRENATAL) 27-0.8 MG TABS tablet Take 1 tablet by mouth daily at 12 noon. 01/03/21 04/13/21 Yes Caren Macadam, MD    Physical Exam Vitals: Blood pressure (!) 85/54, pulse (!) 102, weight 154 lb (69.9 kg), last menstrual period 11/24/2020.  General: NAD HEENT: normocephalic, anicteric Thyroid: no enlargement, no palpable nodules Pulmonary: No increased work of breathing, CTAB Cardiovascular: RRR, distal pulses 2+ Abdomen: NABS, soft, non-tender, non-distended.  Umbilicus without lesions.  No hepatomegaly, splenomegaly or masses palpable. No evidence of hernia. FHTs 158 with doppler  Genitourinary:  External: Normal external female genitalia.  Normal urethral meatus, normal Bartholin's and Skene's glands.    Vagina: Normal vaginal mucosa, no evidence of prolapse.    Cervix: Grossly normal in appearance, no bleeding, no CMT  Uterus:  enlarged, mobile, normal contour.    Adnexa: ovaries non-enlarged, no adnexal masses  Rectal: deferred Extremities: no edema, erythema, or tenderness Neurologic: Grossly intact Psychiatric: mood appropriate,  affect full   The following were addressed during this visit:  Breastfeeding Education - Early initiation of breastfeeding    Comments: Keeps milk supply adequate, helps contract uterus and slow bleeding, and early milk is the perfect first food and is easy to digest.   - The importance of exclusive breastfeeding    Comments: Provides antibodies, Lower risk of breast and ovarian cancers, and type-2 diabetes,Helps your body recover, Reduced chance of SIDS.   - Risks of giving your baby anything other than breast milk if you are breastfeeding    Comments: Make the baby less content with breastfeeds, may make my baby more susceptible to illness, and may reduce my milk supply.   - The importance of early skin-to-skin contact    Comments:  Keeps baby warm and secure, helps keep baby's blood sugar up and breathing steady, easier to bond and breastfeed, and helps calm baby.  - Rooming-in on a 24-hour basis    Comments: Easier to learn baby's feeding cues, easier to bond and get to know each other, and encourages milk production.   - Feeding on demand or baby-led feeding    Comments: Helps prevent breastfeeding complications, helps bring in good milk supply, prevents under or overfeeding, and helps baby feel content and satisfied   - Frequent feeding to help assure optimal milk production    Comments: Making a full supply of milk requires frequent removal of milk from breasts, infant will eat 8-12 times in 24 hours, if separated from infant use breast massage, hand expression and/ or pumping to remove milk from breasts.   - Effective positioning and attachment    Comments: Helps my baby to get enough breast milk, helps to produce an adequate milk supply, and helps prevent nipple pain and damage   - Exclusive breastfeeding for the first 6 months    Comments: Builds a healthy milk supply and keeps it up, protects baby from sickness and disease, and breastmilk has everything your baby  needs for the first 6 months.   Assessment: 30 y.o. G3P1002 at 63w0dby LMP presenting to initiate prenatal  care  Plan: 1) Avoid alcoholic beverages. 2) Patient encouraged not to smoke.  3) Discontinue the use of all non-medicinal drugs and chemicals.  4) Take prenatal vitamins daily.  5) Nutrition, food safety (fish, cheese advisories, and high nitrite foods) and exercise discussed. 6) Hospital and practice style discussed with cross coverage system.  7) Genetic Screening, such as with 1st Trimester Screening, cell free fetal DNA, AFP testing, and Ultrasound, as well as with amniocentesis and CVS as appropriate, is discussed with patient. At the conclusion of today's visit patient requested genetic testing 8) Patient is asked about travel to areas at risk for the Zika virus, and counseled to avoid travel and exposure to mosquitoes or sexual partners who may have themselves been exposed to the virus. Testing is discussed, and will be ordered as appropriate.  9) PAPtima, urine culture, UDS, NOB panel, Hep C screen, sickle cell screen, MaterniT 21 today 10) Return to clinic in 1 week for dating scan and rob   Rod Can, [place] 02/11/2021, 2:27 PM

## 2021-02-13 LAB — CYTOLOGY - PAP
Chlamydia: NEGATIVE
Comment: NEGATIVE
Comment: NEGATIVE
Comment: NEGATIVE
Comment: NORMAL
Diagnosis: NEGATIVE
High risk HPV: NEGATIVE
Neisseria Gonorrhea: NEGATIVE
Trichomonas: NEGATIVE

## 2021-02-14 LAB — RPR+RH+ABO+RUB AB+AB SCR+CB...
HIV Screen 4th Generation wRfx: NONREACTIVE
Hematocrit: 37.2 % (ref 34.0–46.6)
Hemoglobin: 12.6 g/dL (ref 11.1–15.9)
Hepatitis B Surface Ag: NEGATIVE
MCH: 31.8 pg (ref 26.6–33.0)
MCHC: 33.9 g/dL (ref 31.5–35.7)
MCV: 94 fL (ref 79–97)
Platelets: 317 10*3/uL (ref 150–450)
RBC: 3.96 x10E6/uL (ref 3.77–5.28)
RDW: 12.4 % (ref 11.7–15.4)
RPR Ser Ql: NONREACTIVE
Rh Factor: POSITIVE
Rubella Antibodies, IGG: 1.07 index (ref >0.99)
Varicella zoster IgG: 198 index (ref >165)
WBC: 6.9 10*3/uL (ref 3.4–10.8)

## 2021-02-14 LAB — MATERNIT21 PLUS CORE+SCA
Fetal Fraction: 10
Monosomy X (Turner Syndrome): NOT DETECTED
Result (T21): NEGATIVE
Trisomy 13 (Patau syndrome): NEGATIVE
Trisomy 18 (Edwards syndrome): NEGATIVE
Trisomy 21 (Down syndrome): NEGATIVE
XXX (Triple X Syndrome): NOT DETECTED
XXY (Klinefelter Syndrome): NOT DETECTED
XYY (Jacobs Syndrome): NOT DETECTED

## 2021-02-14 LAB — HGB FRACTIONATION CASCADE
Hgb A2: 2.5 % (ref 1.8–3.2)
Hgb A: 97.5 % (ref 96.4–98.8)
Hgb F: 0 % (ref 0.0–2.0)
Hgb S: 0 %

## 2021-02-14 LAB — AB SCR+ANTIBODY ID

## 2021-02-14 LAB — HEPATITIS C ANTIBODY: Hep C Virus Ab: 0.2 s/co ratio (ref 0.0–0.9)

## 2021-02-21 LAB — URINE DRUG PANEL 7
Amphetamines, Urine: NEGATIVE ng/mL
Barbiturate Quant, Ur: NEGATIVE ng/mL
Benzodiazepine Quant, Ur: NEGATIVE ng/mL
Cannabinoid Quant, Ur: POSITIVE — AB
Cocaine (Metab.): NEGATIVE ng/mL
Opiate Quant, Ur: NEGATIVE ng/mL
PCP Quant, Ur: NEGATIVE ng/mL

## 2021-03-02 ENCOUNTER — Ambulatory Visit (INDEPENDENT_AMBULATORY_CARE_PROVIDER_SITE_OTHER): Payer: Medicaid Other | Admitting: Obstetrics & Gynecology

## 2021-03-02 ENCOUNTER — Other Ambulatory Visit: Payer: Self-pay

## 2021-03-02 ENCOUNTER — Encounter: Payer: Self-pay | Admitting: Obstetrics & Gynecology

## 2021-03-02 VITALS — BP 100/60 | Wt 158.0 lb

## 2021-03-02 DIAGNOSIS — Z3A14 14 weeks gestation of pregnancy: Secondary | ICD-10-CM

## 2021-03-02 DIAGNOSIS — Z3689 Encounter for other specified antenatal screening: Secondary | ICD-10-CM | POA: Diagnosis not present

## 2021-03-02 DIAGNOSIS — Z3482 Encounter for supervision of other normal pregnancy, second trimester: Secondary | ICD-10-CM

## 2021-03-02 DIAGNOSIS — N926 Irregular menstruation, unspecified: Secondary | ICD-10-CM | POA: Diagnosis not present

## 2021-03-02 DIAGNOSIS — Z3481 Encounter for supervision of other normal pregnancy, first trimester: Secondary | ICD-10-CM

## 2021-03-02 DIAGNOSIS — Z369 Encounter for antenatal screening, unspecified: Secondary | ICD-10-CM

## 2021-03-02 NOTE — Progress Notes (Signed)
  Subjective  Fetal Movement? yes No nausea Vaginal Bleeding? no  Objective  BP 100/60   Wt 158 lb (71.7 kg)   LMP 11/24/2020 (Exact Date) Comment: LMP heavier than normal  BMI 27.99 kg/m  General: NAD Pumonary: no increased work of breathing Abdomen: gravid, non-tender Extremities: no edema Psychiatric: mood appropriate, affect full  Assessment  30 y.o. G3P1002 at 87w0dby  08/31/2021, by Last Menstrual Period presenting for routine prenatal visit  Plan   Problem List Items Addressed This Visit      Other   Supervision of normal pregnancy - Primary  Other Visit Diagnoses    [redacted] weeks gestation of pregnancy       Screening, antenatal, for fetal anatomic survey       Relevant Orders   [place]OB Comp + 14 Wk   Irregular menses          Pregnancy #3 Problems (from 02/09/21 to present)    Problem Noted Resolved   Supervision of normal pregnancy 02/11/2021 by GRod Can CNM No   Overview Signed 02/11/2021  2:15 PM by GRod Can CNM     Nursing Staff Provider  Office Location  [facility] Dating    Language  English Anatomy [place]   Flu Vaccine   Genetic Screen  NIPS:   TDaP vaccine    Hgb A1C or  GTT Early : NA Third trimester :   Covid    LAB RESULTS   Rhogam   Blood Type     Feeding Plan Breast Antibody    Contraception  Rubella    Circumcision  RPR     Pediatrician   HBsAg     Support Person TMarcello MooresHIV    Prenatal Classes  Varicella     GBS  (For PCN allergy, check sensitivities)   BTL Consent     VBAC Consent  Pap      Hgb Electro    Pelvis Tested 5#14oz CF      SMA                 PNV  See [place]  Anat [place]19 weeks  PBarnett Applebaum MD, FLoura PardonOb/Gyn, [facility] 03/02/2021  3:23 PM

## 2021-03-02 NOTE — Progress Notes (Signed)
ULTRASOUND REPORT  Location: [facility] Date of Service: 03/02/2021   Indications: First ultrasound, irreg periods Findings:  Singleton intrauterine pregnancy is visualized with FHR at 150 BPM.   Biometrics give an (U/S) Gestational age of 70w3dand an (U/S) EDD of 08/21/21; this correlates with the clinically established Estimated Date of Delivery: 08/31/21   Fetal presentation is Variable.  EFW: 159g. Placenta: anterior. Grade: 1 AFI: subjectively normal. Gender - female.    Right Ovary: is normal in appearance. Left Ovary: is normal appearance. Survey of the adnexa demonstrates no adnexal masses.  There is no free peritoneal fluid in the cul de sac.  Impression: 1. 116w0diable Singleton Intrauterine pregnancy by U/S. 2. (U/S) EDD is consistent with Clinically established Estimated Date of Delivery: 08/31/21 . 3. Normal Anatomy Scan  Recommendations: 1.Clinical correlation with the patient's History and Physical Exam. 2. Keep EDC 3. Anat scan scheduled for 18-20 weeks  There is a singleton gestation with subjectively normal amniotic fluid volume. The fetal biometry correlates with established dating.  It must be noted that a normal ultrasound is unable to rule out fetal aneuploidy.    PaBarnett ApplebaumMD, FALoura Pardonb/Gyn, [place] 03/02/2021  3:28 PM

## 2021-03-02 NOTE — Patient Instructions (Signed)

## 2021-03-25 ENCOUNTER — Other Ambulatory Visit: Payer: Self-pay

## 2021-03-25 ENCOUNTER — Inpatient Hospital Stay (HOSPITAL_BASED_OUTPATIENT_CLINIC_OR_DEPARTMENT_OTHER): Payer: Medicaid Other

## 2021-03-25 ENCOUNTER — Inpatient Hospital Stay (HOSPITAL_COMMUNITY)
Admission: AD | Admit: 2021-03-25 | Discharge: 2021-03-25 | Disposition: A | Discharge status: Home / Self Care | Payer: Medicaid Other | Attending: Obstetrics and Gynecology | Admitting: Obstetrics and Gynecology

## 2021-03-25 ENCOUNTER — Ambulatory Visit (HOSPITAL_COMMUNITY): Payer: Medicaid Other

## 2021-03-25 ENCOUNTER — Encounter (HOSPITAL_COMMUNITY): Payer: Self-pay | Admitting: Obstetrics and Gynecology

## 2021-03-25 DIAGNOSIS — R109 Unspecified abdominal pain: Secondary | ICD-10-CM

## 2021-03-25 DIAGNOSIS — R110 Nausea: Secondary | ICD-10-CM | POA: Diagnosis not present

## 2021-03-25 DIAGNOSIS — F1290 Cannabis use, unspecified, uncomplicated: Secondary | ICD-10-CM | POA: Diagnosis not present

## 2021-03-25 DIAGNOSIS — O26892 Other specified pregnancy related conditions, second trimester: Secondary | ICD-10-CM | POA: Insufficient documentation

## 2021-03-25 DIAGNOSIS — Z3A17 17 weeks gestation of pregnancy: Secondary | ICD-10-CM | POA: Insufficient documentation

## 2021-03-25 DIAGNOSIS — O26899 Other specified pregnancy related conditions, unspecified trimester: Secondary | ICD-10-CM

## 2021-03-25 DIAGNOSIS — F32A Depression, unspecified: Secondary | ICD-10-CM | POA: Diagnosis not present

## 2021-03-25 DIAGNOSIS — O99891 Other specified diseases and conditions complicating pregnancy: Secondary | ICD-10-CM

## 2021-03-25 DIAGNOSIS — O99342 Other mental disorders complicating pregnancy, second trimester: Secondary | ICD-10-CM | POA: Insufficient documentation

## 2021-03-25 DIAGNOSIS — O99322 Drug use complicating pregnancy, second trimester: Secondary | ICD-10-CM | POA: Insufficient documentation

## 2021-03-25 LAB — CBC WITH DIFFERENTIAL/PLATELET
Abs Immature Granulocytes: 0.02 10*3/uL (ref 0.00–0.07)
Basophils Absolute: 0 10*3/uL (ref 0.0–0.1)
Basophils Relative: 0 %
Eosinophils Absolute: 0.1 10*3/uL (ref 0.0–0.5)
Eosinophils Relative: 2 %
HCT: 30.7 % — ABNORMAL LOW (ref 36.0–46.0)
Hemoglobin: 10.5 g/dL — ABNORMAL LOW (ref 12.0–15.0)
Immature Granulocytes: 0 %
Lymphocytes Relative: 31 %
Lymphs Abs: 2.2 10*3/uL (ref 0.7–4.0)
MCH: 31.9 pg (ref 26.0–34.0)
MCHC: 34.2 g/dL (ref 30.0–36.0)
MCV: 93.3 fL (ref 80.0–100.0)
Monocytes Absolute: 0.5 10*3/uL (ref 0.1–1.0)
Monocytes Relative: 8 %
Neutro Abs: 4.2 10*3/uL (ref 1.7–7.7)
Neutrophils Relative %: 59 %
Platelets: 279 10*3/uL (ref 150–400)
RBC: 3.29 MIL/uL — ABNORMAL LOW (ref 3.87–5.11)
RDW: 12.2 % (ref 11.5–15.5)
WBC: 7.2 10*3/uL (ref 4.0–10.5)
nRBC: 0 % (ref 0.0–0.2)

## 2021-03-25 LAB — URINALYSIS, ROUTINE W REFLEX MICROSCOPIC
Bilirubin Urine: NEGATIVE
Glucose, UA: NEGATIVE mg/dL
Hgb urine dipstick: NEGATIVE
Ketones, ur: 80 mg/dL — AB
Leukocytes,Ua: NEGATIVE
Nitrite: NEGATIVE
Protein, ur: NEGATIVE mg/dL
Specific Gravity, Urine: 1.014 (ref 1.005–1.030)
pH: 6 (ref 5.0–8.0)

## 2021-03-25 LAB — WET PREP, GENITAL
Clue Cells Wet Prep HPF POC: NONE SEEN
Sperm: NONE SEEN
Trich, Wet Prep: NONE SEEN
Yeast Wet Prep HPF POC: NONE SEEN

## 2021-03-25 LAB — OB RESULTS CONSOLE GC/CHLAMYDIA: Gonorrhea: NEGATIVE

## 2021-03-25 MED ORDER — LACTATED RINGERS IV BOLUS
1000.0000 mL | Freq: Once | INTRAVENOUS | Status: AC
  Administered 2021-03-25: 1000 mL via INTRAVENOUS

## 2021-03-25 MED ORDER — ONDANSETRON HCL 4 MG/2ML IJ SOLN
4.0000 mg | Freq: Once | INTRAMUSCULAR | Status: AC
  Administered 2021-03-25: 4 mg via INTRAVENOUS
  Filled 2021-03-25: qty 2

## 2021-03-25 MED ORDER — ALBUTEROL SULFATE HFA 108 (90 BASE) MCG/ACT IN AERS: 1.0000 | INHALATION_SPRAY | Freq: Four times a day (QID) | RESPIRATORY_TRACT | 0 refills | Status: DC | PRN

## 2021-03-25 NOTE — MAU Provider Note (Signed)
Event Date/Time   First Provider Initiated Contact with Patient 03/25/21 1250      Chief Complaint:  Abdominal Pain and Nausea   Shirley Turner is  30 y.o. G3P1002 at 65w2dpresents complaining of Abdominal Pain and Nausea  States that has been having constant lower abdominal pain for a few days. Pain is mainly to the left of her abdomen.  Not eating very well due to decreased appetite. Not tearful anymore (see nurse's note).  Discussed how talking to someone could help her deal with all of this stress.  She is amenable to treatment, said that [facility]    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   Anemia    Anxiety    Asthma    Depression    Headache     Past Surgical History: Past Surgical History:  Procedure Laterality Date   TONSILLECTOMY AND ADENOIDECTOMY      Family History: Family History  Problem Relation Age of Onset   Hypertension Mother    Depression Sister    Bipolar disorder Sister    Diabetes Maternal Grandmother    Hypertension Maternal Grandmother    Kidney disease Maternal Grandmother    Congestive Heart Failure Maternal Grandmother    Diabetes Paternal Grandmother    Hypertension Paternal Grandmother    Arthritis Paternal Grandmother    ADD / ADHD Half-Sister     Social History: Social History   Tobacco Use   Smoking status: Former    Packs/day: 0.25    Types: Cigarettes   Smokeless tobacco: Never  Vaping Use   Vaping Use: Never used  Substance Use Topics   Alcohol use: Not Currently    Alcohol/week: 8.0 standard drinks    Types: 8 Shots of liquor per  week    Comment: as of 03/25/2021 - patient drank 1 glass of wine 03/18/2021   Drug use: Yes    Types: Marijuana    Comment: states she smokes marijuana daily as of 03/25/2021    Allergies: No Known Allergies  Meds:  Medications Prior to Admission  Medication Sig Dispense Refill Last Dose   Prenatal Vit-Fe Fumarate-FA (MULTIVITAMIN-PRENATAL) 27-0.8 MG TABS tablet Take 1 tablet by mouth daily at 12 noon. 100 tablet 0 03/24/2021   Doxylamine-Pyridoxine (DICLEGIS) 10-10 MG TBEC Take 2 tablets by mouth at bedtime as needed. 60 tablet 0    ondansetron (ZOFRAN ODT) 4 MG disintegrating tablet Take 1 tablet (4 mg total) by mouth every 6 (six) hours as needed for nausea. 20 tablet 0     Review of Systems   Constitutional: Negative for fever and chills Eyes: Negative for visual disturbances Respiratory: Negative for shortness of breath, dyspnea Cardiovascular: Negative for chest pain or palpitations  Gastrointestinal: Negative for vomiting, diarrhea and constipation Genitourinary: Negative for dysuria and urgency Musculoskeletal: Negative for back pain, joint pain, myalgias.  Normal ROM  Neurological: Negative for  dizziness and headaches    Physical Exam  Blood pressure 113/69, pulse 86, temperature 97.9 F (36.6 C), temperature source Oral, resp. rate 16, last menstrual period 11/24/2020, SpO2 100 %. GENERAL: Well-developed, well-nourished female in no acute distress.  LUNGS: Normal respiratory effort HEART: Regular rate and rhythm. ABDOMEN: Soft, nondistended, gravid. Some tenderness to light palpation right under umbilicus and to the left.  EXTREMITIES: Nontender, no edema, 2+ distal pulses. DTR's 2+ FHT 150 in triage   Labs: Results for orders placed or performed during the hospital encounter of 03/25/21 (from the past 24 hour(s))  Urinalysis, Routine w reflex microscopic Urine, Clean Catch   Collection Time: 03/25/21 11:04 AM  Result Value Ref Range   Color, Urine YELLOW YELLOW    APPearance HAZY (A) CLEAR   Specific Gravity, Urine 1.014 1.005 - 1.030   pH 6.0 5.0 - 8.0   Glucose, UA NEGATIVE NEGATIVE mg/dL   Hgb urine dipstick NEGATIVE NEGATIVE   Bilirubin Urine NEGATIVE NEGATIVE   Ketones, ur 80 (A) NEGATIVE mg/dL   Protein, ur NEGATIVE NEGATIVE mg/dL   Nitrite NEGATIVE NEGATIVE   Leukocytes,Ua NEGATIVE NEGATIVE  Wet prep, genital   Collection Time: 03/25/21  1:14 PM   Specimen: PATH Cytology Cervicovaginal Ancillary Only  Result Value Ref Range   Yeast Wet Prep HPF POC NONE SEEN NONE SEEN   Trich, Wet Prep NONE SEEN NONE SEEN   Clue Cells Wet Prep HPF POC NONE SEEN NONE SEEN   WBC, Wet Prep HPF POC MODERATE (A) NONE SEEN   Sperm NONE SEEN   CBC with Differential/Platelet   Collection Time: 03/25/21  1:24 PM  Result Value Ref Range   WBC 7.2 4.0 - 10.5 K/uL   RBC 3.29 (L) 3.87 - 5.11 MIL/uL   Hemoglobin 10.5 (L) 12.0 - 15.0 g/dL   HCT 30.7 (L) 36.0 - 46.0 %   MCV 93.3 80.0 - 100.0 fL   MCH 31.9 26.0 - 34.0 pg   MCHC 34.2 30.0 - 36.0 g/dL   RDW 12.2 11.5 - 15.5 %   Platelets 279 150 - 400 K/uL   nRBC 0.0 0.0 - 0.2 %   Neutrophils Relative % 59 %   Neutro Abs 4.2 1.7 - 7.7 K/uL   Lymphocytes Relative 31 %   Lymphs Abs 2.2 0.7 - 4.0 K/uL   Monocytes Relative 8 %   Monocytes Absolute 0.5 0.1 - 1.0 K/uL   Eosinophils Relative 2 %   Eosinophils Absolute 0.1 0.0 - 0.5 K/uL   Basophils Relative 0 %   Basophils Absolute 0.0 0.0 - 0.1 K/uL   Immature Granulocytes 0 %   Abs Immature Granulocytes 0.02 0.00 - 0.07 K/uL   Imaging Studies:  No results found.  Assessment: Shirley Turner is  30 y.o. G3P1002 at 88w2dpresents with abdominal pain, possibly d/t ant fibroid.  Feels less nauseated and "better" after IVF> Depression.  Plan: Discuss [place] w/pt Note sent to OB to refer her to therapy at appt this week Refilled albuterol inhaler  FChristin Fudge9/3/20223:52 PM

## 2021-03-25 NOTE — MAU Note (Signed)
Upon entering room, patient tearful. Patient states her abdominal pain began around two days ago and states it is on both sides and is sharp and constant. She states if she rests and "calms down" the pain will go away. Patient then states "I just have a lot going on." This RN asked the patient if she would like to share and she stated "I feel like I have been depressed lately. I have a lot of things just back to back and a lot going on." Patient then stated, "I am a single mother of two children and my babies father is causing me a lot of stress. I think we are just on different pages and different situations. He is in a space where he is not sure if he wants to be with me so I have just been depressed. We have been together for two years and things were great while he was on the road and now he is home and lives with me and is not working." Patient then stated, "So I just haven't been eating because I don't have a appetite." RN asked patient the last time she ate and patient stated last night when she cooked. Patient stated, "I try to find something once a day, even if it's just some vienna's." RN asked the patient if she had enough food at home and if it has been difficult to buy groceries lately. Patient stated, "Oh no, I have EBT. We have enough food. I just have been so down and don't have an appetite."   RN asked the patient if she would like to speak with someone regarding her comments regarding depression and being down and the patient stated, "No." RN asked the patient her plan moving forward with managing her emotions and patient stated, "The last time I was here I was supposed to go home with something for my anxiety but they never sent it to the pharmacy." Patient declined any suicidal thoughts or intentions to harm herself or others. Patient declines any abuse. RN thanked patient for sharing with her and encouraged the patient to talk with the midwife regarding her depression and anxieties.    Patient endorsed smoking marijuana daily as "It is the only thing that helps me have an appetite." States she has ceased smoking cigarettes.

## 2021-03-25 NOTE — MAU Note (Signed)
Pt reports to mau with c/o constant lower abd pain that started a few days ago.  Pt denies vag bleeding or LOF.  Reports she has been unable to eat due to decrease in appetite.  States she has not taken any medication for pain today.  Pt is tearful in waiting room and during triage.  Reports she has "a lot going on personally."

## 2021-03-28 ENCOUNTER — Other Ambulatory Visit: Payer: Self-pay | Admitting: Advanced Practice Midwife

## 2021-03-28 ENCOUNTER — Other Ambulatory Visit: Payer: Self-pay

## 2021-03-28 LAB — GC/CHLAMYDIA PROBE AMP (~~LOC~~) NOT AT ARMC
Chlamydia: NEGATIVE
Comment: NEGATIVE
Comment: NORMAL
Neisseria Gonorrhea: NEGATIVE

## 2021-03-28 MED ORDER — ONDANSETRON 4 MG PO TBDP: 4.0000 mg | ORAL_TABLET | Freq: Four times a day (QID) | ORAL | 0 refills | Status: DC | PRN

## 2021-03-28 MED ORDER — DOXYLAMINE-PYRIDOXINE 10-10 MG PO TBEC: 2.0000 | DELAYED_RELEASE_TABLET | Freq: Every evening | ORAL | 0 refills | Status: DC | PRN

## 2021-03-31 ENCOUNTER — Encounter: Payer: Medicaid Other | Admitting: Obstetrics and Gynecology

## 2021-03-31 ENCOUNTER — Ambulatory Visit
Admission: RE | Admit: 2021-03-31 | Discharge: 2021-03-31 | Disposition: A | Discharge status: Home / Self Care | Payer: Medicaid Other | Source: Ambulatory Visit | Attending: Obstetrics & Gynecology | Admitting: Obstetrics & Gynecology

## 2021-03-31 ENCOUNTER — Other Ambulatory Visit: Payer: Self-pay

## 2021-03-31 DIAGNOSIS — Z3492 Encounter for supervision of normal pregnancy, unspecified, second trimester: Secondary | ICD-10-CM | POA: Diagnosis not present

## 2021-03-31 DIAGNOSIS — Z3689 Encounter for other specified antenatal screening: Secondary | ICD-10-CM | POA: Diagnosis not present

## 2021-03-31 DIAGNOSIS — Z3A18 18 weeks gestation of pregnancy: Secondary | ICD-10-CM | POA: Diagnosis not present

## 2021-04-03 ENCOUNTER — Encounter: Payer: Medicaid Other | Admitting: Obstetrics and Gynecology

## 2021-04-26 ENCOUNTER — Other Ambulatory Visit: Payer: Self-pay

## 2021-04-26 ENCOUNTER — Ambulatory Visit (INDEPENDENT_AMBULATORY_CARE_PROVIDER_SITE_OTHER): Payer: Medicaid Other | Admitting: Obstetrics & Gynecology

## 2021-04-26 ENCOUNTER — Encounter: Payer: Self-pay | Admitting: Obstetrics & Gynecology

## 2021-04-26 VITALS — BP 120/70 | Wt 165.0 lb

## 2021-04-26 DIAGNOSIS — Z3A21 21 weeks gestation of pregnancy: Secondary | ICD-10-CM

## 2021-04-26 DIAGNOSIS — Z131 Encounter for screening for diabetes mellitus: Secondary | ICD-10-CM

## 2021-04-26 DIAGNOSIS — Z3482 Encounter for supervision of other normal pregnancy, second trimester: Secondary | ICD-10-CM

## 2021-04-26 MED ORDER — ONDANSETRON 4 MG PO TBDP: 4.0000 mg | ORAL_TABLET | Freq: Four times a day (QID) | ORAL | 2 refills | Status: DC | PRN

## 2021-04-26 NOTE — Progress Notes (Signed)
  Subjective  Fetal Movement? yes Contractions? no Leaking Fluid? no Vaginal Bleeding? no Nausea mild Occas GSI Objective  BP 120/70   Wt 165 lb (74.8 kg)   LMP 11/24/2020 (Exact Date) Comment: LMP heavier than normal  BMI 29.23 kg/m  General: NAD Pumonary: no increased work of breathing Abdomen: gravid, non-tender Extremities: no edema Psychiatric: mood appropriate, affect full  Assessment  30 y.o. [id number] at [redacted]w[redacted]d by  08/31/2021, by Last Menstrual Period presenting for routine prenatal visit  Plan   Problem List Items Addressed This Visit      Other   Supervision of normal pregnancy  Other Visit Diagnoses    [redacted] weeks gestation of pregnancy    -  Primary   Screening for diabetes mellitus       Relevant Orders   28 Week RH+Panel    Zofran prn Tums for GERD PNV  Pregnancy #3 Problems (from 02/09/21 to present)    Problem Noted Resolved   Supervision of normal pregnancy 02/11/2021 by Rod Can, CNM No   Overview Addendum 04/26/2021  2:30 PM by Gae Dry, MD     Nursing Staff Provider  Office Location  [facility] Dating  Lmp, [place]  Language  English Anatomy US  [facility] nml, fibroid 4cm  Flu Vaccine   Genetic Screen  NIPS: nml XY  TDaP vaccine    Hgb A1C or  GTT Early : NA Third trimester :   Covid    LAB RESULTS   Rhogam  n/a Blood Type B/Positive/-- (07/21 1033)   Feeding Plan Breast Antibody Comment, See Final Results (07/21 1033)  Contraception  Rubella 1.07 (07/21 1033)  Circumcision  RPR Non Reactive (07/21 1033)   Pediatrician   HBsAg Negative (07/21 1033)   Support Person Marcello Moores HIV Non Reactive (07/21 1033)  Prenatal Classes  Varicella Imm    GBS  (For PCN allergy, check sensitivities)   BTL Consent     VBAC Consent n/a Pap  02/10/21 nml    Hgb Electro  neg  Pelvis Tested 5#14oz                    Barnett Applebaum, MD, [place], [facility] 04/26/2021  2:41 PM

## 2021-04-26 NOTE — Patient Instructions (Signed)

## 2021-05-24 ENCOUNTER — Ambulatory Visit (INDEPENDENT_AMBULATORY_CARE_PROVIDER_SITE_OTHER): Payer: Medicaid Other | Admitting: Obstetrics & Gynecology

## 2021-05-24 ENCOUNTER — Encounter: Payer: Self-pay | Admitting: Obstetrics & Gynecology

## 2021-05-24 ENCOUNTER — Other Ambulatory Visit: Payer: Self-pay

## 2021-05-24 VITALS — BP 120/80 | Wt 162.0 lb

## 2021-05-24 DIAGNOSIS — Z3482 Encounter for supervision of other normal pregnancy, second trimester: Secondary | ICD-10-CM

## 2021-05-24 DIAGNOSIS — Z3A25 25 weeks gestation of pregnancy: Secondary | ICD-10-CM

## 2021-05-24 LAB — POCT URINALYSIS DIPSTICK OB
Bilirubin, UA: NEGATIVE
Blood, UA: NEGATIVE
Glucose, UA: NEGATIVE
Ketones, UA: NEGATIVE
Leukocytes, UA: NEGATIVE
Nitrite, UA: NEGATIVE
POC,PROTEIN,UA: NEGATIVE
Spec Grav, UA: 1.01 (ref 1.010–1.025)
Urobilinogen, UA: 0.2 E.U./dL
pH, UA: 5 (ref 5.0–8.0)

## 2021-05-24 NOTE — Progress Notes (Signed)
  Subjective  Fetal Movement? yes Contractions? no Leaking Fluid? no Vaginal Bleeding? no Suprapubic pain last 2 days    No dysuria, fever, back pain Objective  BP 120/80   Wt 162 lb (73.5 kg)   LMP 11/24/2020 (Exact Date) Comment: LMP heavier than normal  BMI 28.70 kg/m  General: NAD Pumonary: no increased work of breathing Abdomen: gravid, non-tender Extremities: no edema Psychiatric: mood appropriate, affect full  UA NEG  Assessment  30 y.o. [id number] at [redacted]w[redacted]d by  08/31/2021, by Last Menstrual Period presenting for routine prenatal visit  Plan   Problem List Items Addressed This Visit      Other   Supervision of normal pregnancy - Primary  Other Visit Diagnoses    [redacted] weeks gestation of pregnancy        Pelvic pain, no sign of UTI Round lig pain PNV Glucola nv  Pregnancy #3 Problems (from 02/09/21 to present)    Problem     Supervision of normal pregnancy     Overview Addendum 04/26/2021  2:42 PM by Gae Dry, MD     Nursing Staff Provider  Office Location  [facility] Dating  Lmp, [place]  Language  English Anatomy US  [facility] nml, fibroid 4cm  Flu Vaccine  declines Genetic Screen  NIPS: nml XY  TDaP vaccine    Hgb A1C or  GTT Early : NA Third trimester :   Covid    LAB RESULTS   Rhogam  n/a Blood Type B/Positive/-- (07/21 1033)   Feeding Plan Breast Antibody Comment, See Final Results (07/21 1033)  Contraception  Rubella 1.07 (07/21 1033)  Circumcision  RPR Non Reactive (07/21 1033)   Pediatrician   HBsAg Negative (07/21 1033)   Support Person Marcello Moores HIV Non Reactive (07/21 1033)  Prenatal Classes  Varicella Imm    GBS  (For PCN allergy, check sensitivities)   BTL Consent     VBAC Consent n/a Pap  02/10/21 nml    Hgb Electro  neg  Pelvis Tested 5#14oz                    Barnett Applebaum, MD, [place], [facility] 05/24/2021  4:22 PM

## 2021-05-24 NOTE — Patient Instructions (Signed)

## 2021-06-07 ENCOUNTER — Telehealth: Payer: Self-pay

## 2021-06-07 NOTE — Telephone Encounter (Signed)
LMVM to notify can take OTC Claritin/Loratadine, Zyrtec/Cetrizine. If develops cough can take regular robitussin. Will send Safe OTC med in pregnancy list to her my chart for futuer/other reference.

## 2021-06-07 NOTE — Telephone Encounter (Signed)
Patient reports she is coming down with a cold (itchy throat, runny nose) Denies fever.  Inquiring what is safe to take. [phone]

## 2021-06-12 ENCOUNTER — Ambulatory Visit (HOSPITAL_COMMUNITY)
Admission: EM | Admit: 2021-06-12 | Discharge: 2021-06-12 | Disposition: A | Discharge status: Home / Self Care | Payer: Medicaid Other | Attending: Family Medicine | Admitting: Family Medicine

## 2021-06-12 ENCOUNTER — Encounter (HOSPITAL_COMMUNITY): Payer: Self-pay

## 2021-06-12 ENCOUNTER — Other Ambulatory Visit: Payer: Self-pay

## 2021-06-12 DIAGNOSIS — M25571 Pain in right ankle and joints of right foot: Secondary | ICD-10-CM

## 2021-06-12 MED ORDER — PREDNISONE 20 MG PO TABS: 40.0000 mg | ORAL_TABLET | Freq: Every day | ORAL | 0 refills | Status: DC

## 2021-06-12 NOTE — ED Triage Notes (Signed)
Patient complains of R ankle pain x 2 days. Patient is 7 months pregnant.

## 2021-06-12 NOTE — ED Provider Notes (Signed)
[place]   [id number] 06/12/21 Arrival Time: 0903  ASSESSMENT & PLAN:  1. Acute right ankle pain    No indication for imaging. Suspect strain/tendonitis. Fitted with CAM walker. Work note provided.  Begin: Meds ordered this encounter  Medications   predniSONE (DELTASONE) 20 MG tablet    Sig: Take 2 tablets (40 mg total) by mouth daily.    Dispense:  10 tablet    Refill:  0    Orders Placed This Encounter  Procedures   Apply CAM boot    Recommend:  Follow-up Information     Schedule an appointment as soon as possible for a visit  with [facility]).   Why: If worsening or failing to improve as anticipated. Contact information: [address] [place],  [place]  [address]  [phone]                Reviewed expectations re: course of current medical issues. Questions answered. Outlined signs and symptoms indicating need for more acute intervention. Patient verbalized understanding. After Visit Summary given.  SUBJECTIVE: History from: patient. Shirley Turner is a 30 y.o. female who reports R ankle pain; poorly localized. No trauma/injury. Walks/stands at work all day. No swelling/erythema. Wrapping with ACE has helped. No extremity sensation changes or weakness. Tylenol without much help. Currently [redacted] wks pregnant.  Past Surgical History:  Procedure Laterality Date   TONSILLECTOMY AND ADENOIDECTOMY        OBJECTIVE:  Vitals:   06/12/21 1014  BP: 108/71  Pulse: 72  Resp: 19  Temp: 98.5 F (36.9 C)  TempSrc: Oral  SpO2: 98%    General appearance: alert; no distress HEENT: [place]; AT Neck: supple with FROM Resp: unlabored respirations Extremities: RLE: warm with well perfused appearance; poorly localized moderate tenderness over right Achilles tendon; without gross deformities; swelling: none; bruising: none; ankle ROM: normal CV: brisk extremity capillary refill of RLE; 2+ DP pulse of  RLE. 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   Anemia    Anxiety    Asthma    Depression    Headache    Social History   Socioeconomic History   Marital status: Single    Spouse name: Not on file   Number of children: Not on file   Years of education: Not on file   Highest education level: Not on file  Occupational History   Not on file  Tobacco Use   Smoking status: Former    Packs/day: 0.25    Types: Cigarettes   Smokeless tobacco: Never  Vaping Use   Vaping Use: Never used  Substance and Sexual Activity   Alcohol use: Not Currently    Alcohol/week: 8.0 standard drinks    Types: 8 Shots of liquor per week    Comment: as of 03/25/2021 - patient drank 1 glass of wine 03/18/2021   Drug use: Yes    Types: Marijuana    Comment: states she smokes marijuana daily as of 03/25/2021   Sexual activity: Yes    Partners: Male    Birth control/protection: None  Other Topics Concern   Not on file  Social History Narrative   Not on file   Social Determinants of Radio broadcast assistant  Strain: Not on file  Food Insecurity: Not on file  Transportation Needs: Not on file  Physical Activity: Not on file  Stress: Not on file  Social Connections: Not on file   Family History  Problem Relation Age of Onset   Hypertension Mother    Depression Sister    Bipolar disorder Sister    Diabetes Maternal Grandmother    Hypertension Maternal Grandmother    Kidney disease Maternal Grandmother    Congestive Heart Failure Maternal Grandmother    Diabetes Paternal Grandmother    Hypertension Paternal Grandmother    Arthritis Paternal Grandmother    ADD / ADHD Half-Sister    Past Surgical History:  Procedure Laterality Date    TONSILLECTOMY AND ADENOIDECTOMY         Vanessa Kick, MD 06/12/21 1113

## 2021-06-20 ENCOUNTER — Encounter: Payer: Self-pay | Admitting: Obstetrics & Gynecology

## 2021-06-20 ENCOUNTER — Ambulatory Visit (INDEPENDENT_AMBULATORY_CARE_PROVIDER_SITE_OTHER): Payer: Medicaid Other | Admitting: Obstetrics & Gynecology

## 2021-06-20 ENCOUNTER — Other Ambulatory Visit: Payer: Self-pay

## 2021-06-20 ENCOUNTER — Other Ambulatory Visit (HOSPITAL_COMMUNITY)
Admission: RE | Admit: 2021-06-20 | Discharge: 2021-06-20 | Disposition: A | Discharge status: Home / Self Care | Payer: Medicaid Other | Source: Ambulatory Visit | Attending: Obstetrics & Gynecology | Admitting: Obstetrics & Gynecology

## 2021-06-20 VITALS — BP 102/70 | Wt 168.0 lb

## 2021-06-20 DIAGNOSIS — N898 Other specified noninflammatory disorders of vagina: Secondary | ICD-10-CM | POA: Insufficient documentation

## 2021-06-20 DIAGNOSIS — R300 Dysuria: Secondary | ICD-10-CM | POA: Diagnosis not present

## 2021-06-20 DIAGNOSIS — O26893 Other specified pregnancy related conditions, third trimester: Secondary | ICD-10-CM

## 2021-06-20 MED ORDER — FLUCONAZOLE 150 MG PO TABS: 150.0000 mg | ORAL_TABLET | Freq: Once | ORAL | 3 refills | Status: AC

## 2021-06-20 MED ORDER — NITROFURANTOIN MONOHYD MACRO 100 MG PO CAPS: 100.0000 mg | ORAL_CAPSULE | Freq: Two times a day (BID) | ORAL | 0 refills | Status: DC

## 2021-06-20 NOTE — Progress Notes (Signed)
Gynecology Evaluation  History of Present Illness: Patient is a 30 y.o. G3P2002 presents for concern over recent sexual encounter (2 days ago) and concern for STD testing.  The patient does report increased vaginal discharge and pain.  There is a history of prior sexually transmitted infection(s).     The patient is sexually active. She currently is pregnant.  Also, the patient also sometimes relies on condoms to prevent the spread of sexually transmitted infections.  She patient has been previously transfused or tattooed.    PMHx: She  has a past medical history of Adjustment disorder with mixed disturbance of emotions and conduct (06/15/2018), Anemia, Anxiety, Asthma, Depression, and Headache. Also,  has a past surgical history that includes Tonsillectomy and adenoidectomy., family history includes ADD / ADHD in her half-sister; Arthritis in her paternal grandmother; Bipolar disorder in her sister; Congestive Heart Failure in her maternal grandmother; Depression in her sister; Diabetes in her maternal grandmother and paternal grandmother; Hypertension in her maternal grandmother, mother, and paternal grandmother; Kidney disease in her maternal grandmother.,  reports that she has quit smoking. Her smoking use included cigarettes. She smoked an average of .25 packs per day. She has never used smokeless tobacco. She reports that she does not currently use alcohol after a past usage of about 8.0 standard drinks per week. She reports current drug use. Drug: Marijuana.  She has a current medication list which includes the following prescription(s): fluconazole, nitrofurantoin (macrocrystal-monohydrate), albuterol, doxylamine-pyridoxine, ondansetron, and prednisone. Also, has No Known Allergies.  Review of Systems  Constitutional:  Negative for chills, fever and malaise/fatigue.  HENT:  Negative for congestion, sinus pain and sore throat.   Eyes:  Negative for blurred vision and pain.  Respiratory:   Negative for cough and wheezing.   Cardiovascular:  Negative for chest pain and leg swelling.  Gastrointestinal:  Positive for abdominal pain. Negative for constipation, diarrhea, heartburn, nausea and vomiting.  Genitourinary:  Negative for dysuria, frequency, hematuria and urgency.  Musculoskeletal:  Negative for back pain, joint pain, myalgias and neck pain.  Skin:  Negative for itching and rash.  Neurological:  Negative for dizziness, tremors and weakness.  Endo/Heme/Allergies:  Does not bruise/bleed easily.  Psychiatric/Behavioral:  Negative for depression. The patient is not nervous/anxious and does not have insomnia.    Objective: BP 102/70   Wt 168 lb (76.2 kg)   LMP 11/24/2020 (Exact Date) Comment: LMP heavier than normal  BMI 29.76 kg/m  Physical Exam Constitutional:      General: She is not in acute distress.    Appearance: She is well-developed.  Genitourinary:     Right Labia: No rash or tenderness.    Left Labia: No tenderness or rash.    Vaginal discharge and tenderness present.     No vaginal erythema or bleeding.     Cervical discharge present.     No cervical motion tenderness, polyp or nabothian cyst.     Uterus is enlarged.     Pelvic exam was performed with patient in the lithotomy position.  HENT:     Head: Normocephalic and atraumatic.     Nose: Nose normal.  Abdominal:     General: There is no distension.     Palpations: Abdomen is soft.     Tenderness: There is no abdominal tenderness.  Musculoskeletal:        General: Normal range of motion.  Neurological:     Mental Status: She is alert and oriented to person, place, and time.  Cranial Nerves: No cranial nerve deficit.  Skin:    General: Skin is warm and dry.  Psychiatric:        Attention and Perception: Attention normal.        Mood and Affect: Mood and affect normal.        Speech: Speech normal.        Behavior: Behavior normal.        Thought Content: Thought content normal.         Judgment: Judgment normal.    Female chaperone present for pelvic and breast  portions of the physical exam  Results for orders placed or performed in visit on 05/24/21  POC Urinalysis Dipstick OB  Result Value Ref Range   Color, UA     Clarity, UA     Glucose, UA Negative Negative   Bilirubin, UA Negative    Ketones, UA Negative    Spec Grav, UA 1.010 1.010 - 1.025   Blood, UA Negative    pH, UA 5.0 5.0 - 8.0   POC,PROTEIN,UA Negative Negative, Trace, Small (1+), Moderate (2+), Large (3+), 4+   Urobilinogen, UA 0.2 0.2 or 1.0 E.U./dL   Nitrite, UA Negative    Leukocytes, UA Negative Negative   Appearance     Odor      Assessment: 30 y.o. W4O9735 No problem-specific Assessment & Plan notes found for this encounter.   Plan: Problem List Items Addressed This Visit   None Visit Diagnoses     Dysuria during pregnancy in third trimester    -  Primary   Relevant Orders   Urine Culture   Vaginal discharge       Relevant Orders   Cervicovaginal ancillary only      1) Contraception - Education given regarding options for infection prevention including barrier methods, and avoidance .  2) STI screening was offered and done Await cultures to treat  3) STD prevention  4) Macrobid for UTI, Diflucan for yeast infection  Barnett Applebaum, MD, [facility], [facility] 06/20/2021  11:22 AM

## 2021-06-21 ENCOUNTER — Other Ambulatory Visit: Payer: Medicaid Other

## 2021-06-21 ENCOUNTER — Ambulatory Visit (INDEPENDENT_AMBULATORY_CARE_PROVIDER_SITE_OTHER): Payer: Medicaid Other | Admitting: Obstetrics

## 2021-06-21 VITALS — BP 113/70 | Ht 63.0 in | Wt 168.4 lb

## 2021-06-21 DIAGNOSIS — F1991 Other psychoactive substance use, unspecified, in remission: Secondary | ICD-10-CM

## 2021-06-21 DIAGNOSIS — Z3480 Encounter for supervision of other normal pregnancy, unspecified trimester: Secondary | ICD-10-CM

## 2021-06-21 DIAGNOSIS — Z3A29 29 weeks gestation of pregnancy: Secondary | ICD-10-CM

## 2021-06-21 DIAGNOSIS — Z131 Encounter for screening for diabetes mellitus: Secondary | ICD-10-CM | POA: Diagnosis not present

## 2021-06-21 LAB — POCT URINALYSIS DIPSTICK OB
Glucose, UA: NEGATIVE
POC,PROTEIN,UA: NEGATIVE

## 2021-06-21 NOTE — Addendum Note (Signed)
Addended by: Vernia Buff A on: 06/21/2021 04:20 PM   Modules accepted: Orders

## 2021-06-21 NOTE — Progress Notes (Signed)
Routine Prenatal Care Visit  Subjective  Shirley Turner is a 30 y.o. G3P2002 at [redacted]w[redacted]d being seen today for ongoing prenatal care.  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  ----------------------------------------------------------------------------------- Patient reports no complaints.  Having her 28 week labs today. UDS also retrieved. Contractions: Not present. Vag. Bleeding: None.  Movement: Present. Leaking Fluid denies.  ----------------------------------------------------------------------------------- The following portions of the patient's history were reviewed and updated as appropriate: allergies, current medications, past family history, past medical history, past social history, past surgical history and problem list. Problem list updated.  Objective  Blood pressure 113/70, height 5\' 3"  (1.6 m), weight 168 lb 6.4 oz (76.4 kg), last menstrual period 11/24/2020. Pregravid weight 156 lb (70.8 kg) Total Weight Gain 12 lb 6.4 oz (5.625 kg) Urinalysis: Urine Protein    Urine Glucose    Fetal Status:     Movement: Present     General:  Alert, oriented and cooperative. Patient is in no acute distress.  Skin: Skin is warm and dry. No rash noted.   Cardiovascular: Normal heart rate noted  Respiratory: Normal respiratory effort, no problems with respiration noted  Abdomen: Soft, gravid, appropriate for gestational age. Pain/Pressure: Absent      Pelvic:  Cervical exam deferred        Extremities: Normal range of motion.     Mental Status: Normal mood and affect. Normal behavior. Normal judgment and thought content.   Assessment   30 y.o. E0P2330 at [redacted]w[redacted]d by  08/31/2021, by Last Menstrual Period presenting for routine prenatal visit  Plan   Pregnancy #3 Problems (from 02/09/21 to present)    Problem Noted Resolved   Supervision of normal pregnancy 02/11/2021 by Rod Can, CNM No   Overview Addendum 06/21/2021  3:08 PM by Imagene Riches, CNM     Nursing Staff Provider  Office Location  [facility] Dating  Lmp, [place]  Language  English Anatomy US  [facility] nml, fibroid 4cm  Flu Vaccine  declines Genetic Screen  NIPS: nml XY  TDaP vaccine    Hgb A1C or  GTT Early : NA Third trimester : 11/30  Covid    LAB RESULTS   Rhogam  n/a Blood Type B/Positive/-- (07/21 1033)   Feeding Plan Breast Antibody Comment, See Final Results (07/21 1033)  Contraception  Rubella 1.07 (07/21 1033)  Circumcision  RPR Non Reactive (07/21 1033)   Pediatrician   HBsAg Negative (07/21 1033)   Support Person Marcello Moores HIV Non Reactive (07/21 1033)  Prenatal Classes  Varicella Imm    GBS  (For PCN allergy, check sensitivities)   BTL Consent     VBAC Consent n/a Pap  02/10/21 nml    Hgb Electro  neg  Pelvis Tested 5#14oz                    Preterm labor symptoms and general obstetric precautions including but not limited to vaginal bleeding, contractions, leaking of fluid and fetal movement were reviewed in detail with the patient. Please refer to After Visit Summary for other counseling recommendations.  28 week labs drawn and UDS repeated. Her urine is dark, and she admits to drinking only soda. More water intake encouraged.  No follow-ups on file.  Imagene Riches, CNM  06/21/2021 3:27 PM

## 2021-06-22 ENCOUNTER — Other Ambulatory Visit: Payer: Self-pay | Admitting: Obstetrics & Gynecology

## 2021-06-22 DIAGNOSIS — Z419 Encounter for procedure for purposes other than remedying health state, unspecified: Secondary | ICD-10-CM | POA: Diagnosis not present

## 2021-06-22 LAB — 28 WEEK RH+PANEL
Basophils Absolute: 0 10*3/uL (ref 0.0–0.2)
Basos: 0 %
EOS (ABSOLUTE): 0 10*3/uL (ref 0.0–0.4)
Eos: 0 %
Gestational Diabetes Screen: 127 mg/dL (ref 70–139)
HIV Screen 4th Generation wRfx: NONREACTIVE
Hematocrit: 27 % — ABNORMAL LOW (ref 34.0–46.6)
Hemoglobin: 9.5 g/dL — ABNORMAL LOW (ref 11.1–15.9)
Immature Grans (Abs): 0 10*3/uL (ref 0.0–0.1)
Immature Granulocytes: 0 %
Lymphocytes Absolute: 3.2 10*3/uL — ABNORMAL HIGH (ref 0.7–3.1)
Lymphs: 34 %
MCH: 31.6 pg (ref 26.6–33.0)
MCHC: 35.2 g/dL (ref 31.5–35.7)
MCV: 90 fL (ref 79–97)
Monocytes Absolute: 0.5 10*3/uL (ref 0.1–0.9)
Monocytes: 6 %
Neutrophils Absolute: 5.5 10*3/uL (ref 1.4–7.0)
Neutrophils: 60 %
Platelets: 303 10*3/uL (ref 150–450)
RBC: 3.01 x10E6/uL — ABNORMAL LOW (ref 3.77–5.28)
RDW: 11.8 % (ref 11.7–15.4)
RPR Ser Ql: NONREACTIVE
WBC: 9.4 10*3/uL (ref 3.4–10.8)

## 2021-06-22 LAB — CERVICOVAGINAL ANCILLARY ONLY
Bacterial Vaginitis (gardnerella): POSITIVE — AB
Candida Glabrata: NEGATIVE
Candida Vaginitis: POSITIVE — AB
Chlamydia: NEGATIVE
Comment: NEGATIVE
Comment: NEGATIVE
Comment: NEGATIVE
Comment: NEGATIVE
Comment: NEGATIVE
Comment: NORMAL
Neisseria Gonorrhea: NEGATIVE
Trichomonas: NEGATIVE

## 2021-06-22 LAB — URINE CULTURE

## 2021-06-22 MED ORDER — METRONIDAZOLE 500 MG PO TABS: 500.0000 mg | ORAL_TABLET | Freq: Two times a day (BID) | ORAL | 0 refills | Status: AC

## 2021-07-03 ENCOUNTER — Encounter: Payer: Self-pay | Admitting: Obstetrics

## 2021-07-03 DIAGNOSIS — O99320 Drug use complicating pregnancy, unspecified trimester: Secondary | ICD-10-CM | POA: Insufficient documentation

## 2021-07-03 DIAGNOSIS — F1290 Cannabis use, unspecified, uncomplicated: Secondary | ICD-10-CM | POA: Insufficient documentation

## 2021-07-03 LAB — URINE DRUG PANEL 7
Amphetamines, Urine: NEGATIVE ng/mL
Barbiturate Quant, Ur: NEGATIVE ng/mL
Benzodiazepine Quant, Ur: NEGATIVE ng/mL
Cannabinoid Quant, Ur: POSITIVE — AB
Cocaine (Metab.): NEGATIVE ng/mL
Opiate Quant, Ur: NEGATIVE ng/mL
PCP Quant, Ur: NEGATIVE ng/mL

## 2021-07-05 ENCOUNTER — Other Ambulatory Visit: Payer: Self-pay

## 2021-07-05 ENCOUNTER — Ambulatory Visit (INDEPENDENT_AMBULATORY_CARE_PROVIDER_SITE_OTHER): Payer: Medicaid Other | Admitting: Obstetrics and Gynecology

## 2021-07-05 ENCOUNTER — Encounter: Payer: Medicaid Other | Admitting: Obstetrics and Gynecology

## 2021-07-05 VITALS — BP 100/70 | Wt 165.0 lb

## 2021-07-05 DIAGNOSIS — Z23 Encounter for immunization: Secondary | ICD-10-CM

## 2021-07-05 DIAGNOSIS — F1991 Other psychoactive substance use, unspecified, in remission: Secondary | ICD-10-CM

## 2021-07-05 DIAGNOSIS — Z3A31 31 weeks gestation of pregnancy: Secondary | ICD-10-CM

## 2021-07-05 DIAGNOSIS — Z3480 Encounter for supervision of other normal pregnancy, unspecified trimester: Secondary | ICD-10-CM

## 2021-07-05 NOTE — Progress Notes (Signed)
°  Subjective  Fetal Movement? yes Contractions? no Leaking Fluid? no Vaginal Bleeding? no PNVs? Yes; pt doesn't think they have Fe in them. Recommended adding Fe supp QD due to recent H/H.   Still doing marijuana occas, recommended cessation. Increase water/d/c sodas and tea--caffeine affecting sleep.  Has some pubic bone/groin pain, reassurance. TdAP and BT today. Breast/abstinence  Objective  BP 100/70    Wt 165 lb (74.8 kg)    LMP 11/24/2020 (Exact Date) Comment: LMP heavier than normal   BMI 29.23 kg/m  General: NAD Pulmonary: no increased work of breathing Abdomen: gravid, non-tender Extremities: no edema Psychiatric: mood appropriate, affect full  Assessment  30 y.o. Y5O5929 at [redacted]w[redacted]d by  08/31/2021, by Last Menstrual Period presenting for routine prenatal visit  Plan   Problem List Items Addressed This Visit      Other   Supervision of normal pregnancy - Primary  Other Visit Diagnoses    [redacted] weeks gestation of pregnancy       History of recreational drug use       Need for Tdap vaccination       Relevant Orders   Tdap vaccine greater than or equal to 7yo IM (Completed)     Has f/u ROB appt sched  Hartley Wyke B. Delaine Hernandez, PA-C [facility],  07/05/2021  9:58 AM

## 2021-07-05 NOTE — Progress Notes (Signed)
ROB- TDAP and BT consent signed today, no concerns

## 2021-07-06 ENCOUNTER — Telehealth: Payer: Self-pay

## 2021-07-06 NOTE — Telephone Encounter (Signed)
Pt left msg on triage saying she has had a headache for the past 3 days, been taking tylenol and its not helping. She's asking if something can be sent please. Please advise.

## 2021-07-06 NOTE — Telephone Encounter (Signed)
Sch for BP check prior to Rx medicine for headache

## 2021-07-07 NOTE — Telephone Encounter (Signed)
Called and left voicemail for patient to call back to be scheduled. 

## 2021-07-13 NOTE — Telephone Encounter (Signed)
Attempt to reach patient via phone. Phone rang twice then disconnected. Unable to leave message.

## 2021-07-19 ENCOUNTER — Encounter: Payer: Medicaid Other | Admitting: Advanced Practice Midwife

## 2021-07-23 DIAGNOSIS — Z419 Encounter for procedure for purposes other than remedying health state, unspecified: Secondary | ICD-10-CM | POA: Diagnosis not present

## 2021-07-23 NOTE — L&D Delivery Note (Signed)
Delivery Note Pt have recurrent variables and bradycardia to 90-110's after epidural. Fluid bolus, phenylephrine position changes and Terbutaline given. FHR improved. Mod variability throughout entire episode. Dr. Kennon Rounds notified, at [facility] in case vacuum delivery indicated. Delivery expedited by reduction of cervical lip. NICU at [facility]. At 6:51 PM a vigorous, viable and healthy female was delivered via  (Presentation: Left Occiput Anterior). Brief delay from delivery of head to delivery of body due to compound right arm. Arm delivered by hooking axilla. Shoulders delivered easily. Infant placed skin-to-skin w/ mom. Mod gush of blood. Placenta delivered w/ gentle cord traction. Pitocin bolus and TXA given simultaneously. Bleeding slowed quickly but continued to trickle. Vagina inspected. Cervical anterior lip visible and intact. Trailing membranes noted and removed w/ ring forceps. (Did consider Jada, but not needed after training membranes removed.) Delayed cord clamping x 1 minute. Cord clamped x 2 and cut by pt. Placenta status: Spontaneous, Intact. Trailing membranes removed w/ ring forceps. Cord: with the following complications: Loose nuchal .  Cord pH: NA. APGAR: 8, 9; weight  pending.    Anesthesia: Epidural Episiotomy: None  Lacerations: None Suture Repair:  NA Est. Blood Loss (mL):  350 ml  Mom to postpartum.  Baby to Couplet care / Skin to Skin. Placenta to: LD  Feeding: Breast Circ: IP Contraception: LAM/NFP Plans to F/U at [facility] 08/13/2021, 7:13 PM

## 2021-08-02 ENCOUNTER — Encounter: Payer: Medicaid Other | Admitting: Obstetrics and Gynecology

## 2021-08-10 ENCOUNTER — Other Ambulatory Visit: Payer: Self-pay

## 2021-08-10 ENCOUNTER — Ambulatory Visit (INDEPENDENT_AMBULATORY_CARE_PROVIDER_SITE_OTHER): Payer: Medicaid Other | Admitting: Licensed Practical Nurse

## 2021-08-10 VITALS — BP 122/74 | Wt 166.0 lb

## 2021-08-10 DIAGNOSIS — Z3685 Encounter for antenatal screening for Streptococcus B: Secondary | ICD-10-CM | POA: Diagnosis not present

## 2021-08-10 DIAGNOSIS — D259 Leiomyoma of uterus, unspecified: Secondary | ICD-10-CM

## 2021-08-10 DIAGNOSIS — Z3A37 37 weeks gestation of pregnancy: Secondary | ICD-10-CM

## 2021-08-10 DIAGNOSIS — Z113 Encounter for screening for infections with a predominantly sexual mode of transmission: Secondary | ICD-10-CM

## 2021-08-10 DIAGNOSIS — Z3483 Encounter for supervision of other normal pregnancy, third trimester: Secondary | ICD-10-CM

## 2021-08-10 DIAGNOSIS — O99013 Anemia complicating pregnancy, third trimester: Secondary | ICD-10-CM

## 2021-08-10 NOTE — Progress Notes (Signed)
Routine Prenatal Care Visit  Subjective  Shirley Turner is a 31 y.o. G3P2002 at [redacted]w[redacted]d being seen today for ongoing prenatal care.  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  ----------------------------------------------------------------------------------- Patient reports  numbness in legs .  Works on her feet, rec support belt and compression stockings.  -Reports she has been dealing with Depression therefore she has not been able to keep appointments. Reports that when she is depressed she tends to sit by herself and close herself off to everybody, she has been able to work during this time-working with people improves her mood.  Currently is not being treated for the depression, seems uninterested in medication, may be ope to therapy in the future. She and the FOB have had a falling out, PHQ 7 today. She has limited support, her parents live in [place], she is in [place].  She currently lives with her 2 kids, she will be able to get to the hospital once in labor.  -Smokes cigarettes and MJ to help mood, aware of recommendation to quit.  -has a uterine fibroid, was told she was to have a repeat US to check on fibroid, [place] ordered -reports she is anemic, has does take Iron supplements   Contractions: Not present. Vag. Bleeding: None.  Movement: Present. Leaking Fluid denies.   ----------------------------------------------------------------------------------- The following portions of the patient's history were reviewed and updated as appropriate: allergies, current medications, past family history, past medical history, past social history, past surgical history and problem list. Problem list updated.  Objective  Blood pressure 122/74, weight 166 lb (75.3 kg), last menstrual period 11/24/2020. Pregravid weight 156 lb (70.8 kg) Total Weight Gain 10 lb (4.536 kg) Urinalysis: Urine Protein    Urine Glucose    Fetal Status: Fetal Heart Rate (bpm): 135 Fundal Height: 37 cm Movement: Present     General:  Alert, oriented and cooperative. Patient is in no acute distress.  Skin: Skin is warm and dry. No rash noted.   Cardiovascular: Normal heart rate noted  Respiratory: Normal respiratory effort, no problems with respiration noted  Abdomen: Soft, gravid, appropriate for gestational age. Pain/Pressure: Present     Pelvic:  Cervical exam deferred        Extremities: Normal range of motion.  Edema: Trace  Mental Status: Normal mood and affect. Normal behavior. Normal judgment and thought content.   Assessment   31 y.o. [id number] at [redacted]w[redacted]d by  08/31/2021, by Last Menstrual Period presenting for routine prenatal visit GBS screening   Plan   Pregnancy #3 Problems (from 02/09/21 to present)     Problem Noted Resolved   Marijuana use during pregnancy 07/03/2021 by Imagene Riches, CNM No   Overview Signed 07/03/2021  5:12 PM by Imagene Riches, CNM    Tested + in first and third trimester. Advised to stop and counseled.      Supervision of normal pregnancy 02/11/2021 by Rod Can, CNM No   Overview Addendum 06/21/2021  3:08 PM by Imagene Riches, [place]     Nursing Staff Provider  Office Location  [facility] Dating  Lmp, [place]  Language  English Anatomy US  [facility] nml, fibroid 4cm  Flu Vaccine  declines Genetic Screen  NIPS: nml XY  TDaP vaccine    Hgb A1C or  GTT  Early : NA Third trimester : 11/30  Covid    LAB RESULTS   Rhogam  n/a Blood Type B/Positive/-- (07/21 1033)   Feeding Plan Breast Antibody Comment, See Final Results (07/21 1033)  Contraception Uses an APP  Rubella 1.07 (07/21 1033)  Circumcision Yes RPR Non Reactive (07/21 1033)   Pediatrician  Shirley Turner  HBsAg Negative (07/21 1033)   Support Person Shirley Turner HIV Non Reactive (07/21 1033)  Prenatal Classes  Varicella Imm    GBS  (For PCN allergy, check sensitivities)   BTL Consent     VBAC Consent n/a Pap  02/10/21 nml    Hgb Electro  neg  Pelvis Tested 5#14oz          Depression: PHQ 7 on 1/19, has missed ROB's d/t mood. Watch mood PP, plan 2 week PP visit            Term labor symptoms and general obstetric precautions including but not limited to vaginal bleeding, contractions, leaking of fluid and fetal movement were reviewed in detail with the patient. Please refer to After Visit Summary for other counseling recommendations.   Return in about 1 week (around 08/17/2021) for ROB.  36 week labs including CBC collected   [place] to fu on fibroid ordered   Roberto Scales, [place], [facility]  08/10/21  3:17 PM

## 2021-08-10 NOTE — Progress Notes (Signed)
No vb. No lof. Pt wondering about getting another u/s to check on "fibroid"

## 2021-08-10 NOTE — Patient Instructions (Signed)
34 Week Obstetrics Instructions  PRE-REGISTRATION  Please plan to pre-register at the hospital during the next week.  You can complete the form only by visiting [email or web address]   If you have any questions concerning this please contact the main number at [facility] [phone]  SIGNS OF LABOR  There is never an absolute way for an individual to determine if they ar truly in labor unless they are seen in the office or at the hospital.  However, please follow these guidelines in order to determine if you may potentially be in labor:  A.  Contractions 5 minutes apart for an hour (first baby)  B. Contractions 5 minutes apart for 20-30 minutes (if you have had a baby)  C. If you have any doubts   -If A, B, or C occur, please go to the hospital to be seen in Labor & Delivery [facility] [phone]-the nurse will check you and contact us. Please report to the hospital through the Emergency Room entrance and they will transport you to labor and delivery.  SPONTANEOUS RUPTURE OF MEMBRANES ("Water Breaks")  This is usually evidenced by a gush of fluid from the vagina.  If this occurs during office hours (and you are uncertain if your water has just broken), schedule ana appointment to come into the office to be checked.  After office hours and on weekends, go to the labor and delivery unit where the nurse will check you and contact us.  VAGINAL BLEEDING  A.  "Bloody Show" - this is a thick blood streaked vaginal discharge.  It is normal and should cause no alarm.  B. Heavy vaginal bleeding (similar to that of a period or heavier).  If this occurs, you should go IMMEDIATELY to the labor and delivery unit.  WHEN TO CALL  1. Routine Questions: please make a list of questions you may have and merely ask them at your next visit in the office.  2. Urgent or Worrisome Questions:  During office hours, please  call us at [phone] to leave a message with the nurse and/or provider for assistance.  After hours, the answering system will direct you as to which provider is on call and if your concern requires that they be paged.  If so, follow the instructions on the voicemail and the provider will contact you back at their earliest convenience.  3. Labor or Rupture of Membranes:  This will require an examination.  Please contact the office to schedule an appointment for exam.  After office hours and on the weekends, go to the Labor and Delivery unit at the hospital where the nurse will check you and call us.  You do not need to notify us at night that you are coming.  INSURANCE REGULATIONS/HOSPITAL DISCHARGE It is very important that you understand that not all delivers are the same.  Typically, a vaginal delivery requires a 2 day stay at the hospital and a cesarean delivery requires 4 days in the hospital for recovery.  It is your responsibility to know what time frame your insurance suggests you stay in the hospital for coverage purposes.  However, one must keep in mind that providers use their own judgment and discretion when determining when an individual is able to be discharged.    

## 2021-08-11 ENCOUNTER — Encounter: Payer: Self-pay | Admitting: Licensed Practical Nurse

## 2021-08-11 LAB — CBC WITH DIFFERENTIAL/PLATELET
Basophils Absolute: 0 10*3/uL (ref 0.0–0.2)
Basos: 0 %
EOS (ABSOLUTE): 0 10*3/uL (ref 0.0–0.4)
Eos: 0 %
Hematocrit: 35.9 % (ref 34.0–46.6)
Hemoglobin: 12.1 g/dL (ref 11.1–15.9)
Immature Grans (Abs): 0 10*3/uL (ref 0.0–0.1)
Immature Granulocytes: 0 %
Lymphocytes Absolute: 2 10*3/uL (ref 0.7–3.1)
Lymphs: 37 %
MCH: 30.9 pg (ref 26.6–33.0)
MCHC: 33.7 g/dL (ref 31.5–35.7)
MCV: 92 fL (ref 79–97)
Monocytes Absolute: 0.4 10*3/uL (ref 0.1–0.9)
Monocytes: 7 %
Neutrophils Absolute: 2.9 10*3/uL (ref 1.4–7.0)
Neutrophils: 56 %
Platelets: 331 10*3/uL (ref 150–450)
RBC: 3.91 x10E6/uL (ref 3.77–5.28)
RDW: 12.5 % (ref 11.7–15.4)
WBC: 5.3 10*3/uL (ref 3.4–10.8)

## 2021-08-12 LAB — GC/CHLAMYDIA PROBE AMP
Chlamydia trachomatis, NAA: NEGATIVE
Neisseria Gonorrhoeae by PCR: NEGATIVE

## 2021-08-13 ENCOUNTER — Inpatient Hospital Stay (HOSPITAL_COMMUNITY): Payer: Medicaid Other | Admitting: Anesthesiology

## 2021-08-13 ENCOUNTER — Inpatient Hospital Stay (HOSPITAL_COMMUNITY)
Admission: AD | Admit: 2021-08-13 | Discharge: 2021-08-15 | DRG: 806 | Disposition: A | Discharge status: Home / Self Care | Payer: Medicaid Other | Attending: Family Medicine | Admitting: Family Medicine

## 2021-08-13 ENCOUNTER — Encounter (HOSPITAL_COMMUNITY): Payer: Self-pay | Admitting: Family Medicine

## 2021-08-13 DIAGNOSIS — O99320 Drug use complicating pregnancy, unspecified trimester: Secondary | ICD-10-CM

## 2021-08-13 DIAGNOSIS — D259 Leiomyoma of uterus, unspecified: Secondary | ICD-10-CM | POA: Diagnosis not present

## 2021-08-13 DIAGNOSIS — Z20822 Contact with and (suspected) exposure to covid-19: Secondary | ICD-10-CM | POA: Diagnosis not present

## 2021-08-13 DIAGNOSIS — O4292 Full-term premature rupture of membranes, unspecified as to length of time between rupture and onset of labor: Principal | ICD-10-CM | POA: Diagnosis present

## 2021-08-13 DIAGNOSIS — Z3A37 37 weeks gestation of pregnancy: Secondary | ICD-10-CM | POA: Diagnosis not present

## 2021-08-13 DIAGNOSIS — O99324 Drug use complicating childbirth: Secondary | ICD-10-CM | POA: Diagnosis present

## 2021-08-13 DIAGNOSIS — O4202 Full-term premature rupture of membranes, onset of labor within 24 hours of rupture: Secondary | ICD-10-CM | POA: Diagnosis not present

## 2021-08-13 DIAGNOSIS — O26893 Other specified pregnancy related conditions, third trimester: Secondary | ICD-10-CM | POA: Diagnosis not present

## 2021-08-13 DIAGNOSIS — R52 Pain, unspecified: Secondary | ICD-10-CM | POA: Diagnosis not present

## 2021-08-13 DIAGNOSIS — Z3483 Encounter for supervision of other normal pregnancy, third trimester: Secondary | ICD-10-CM

## 2021-08-13 DIAGNOSIS — O9952 Diseases of the respiratory system complicating childbirth: Secondary | ICD-10-CM | POA: Diagnosis not present

## 2021-08-13 DIAGNOSIS — F1220 Cannabis dependence, uncomplicated: Secondary | ICD-10-CM | POA: Diagnosis present

## 2021-08-13 DIAGNOSIS — Z743 Need for continuous supervision: Secondary | ICD-10-CM | POA: Diagnosis not present

## 2021-08-13 DIAGNOSIS — O4290 Premature rupture of membranes, unspecified as to length of time between rupture and onset of labor, unspecified weeks of gestation: Secondary | ICD-10-CM | POA: Diagnosis present

## 2021-08-13 DIAGNOSIS — O3413 Maternal care for benign tumor of corpus uteri, third trimester: Secondary | ICD-10-CM | POA: Diagnosis present

## 2021-08-13 DIAGNOSIS — Z87891 Personal history of nicotine dependence: Secondary | ICD-10-CM | POA: Diagnosis not present

## 2021-08-13 DIAGNOSIS — J45909 Unspecified asthma, uncomplicated: Secondary | ICD-10-CM | POA: Diagnosis not present

## 2021-08-13 LAB — CBC
HCT: 35.9 % — ABNORMAL LOW (ref 36.0–46.0)
Hemoglobin: 12.5 g/dL (ref 12.0–15.0)
MCH: 32.4 pg (ref 26.0–34.0)
MCHC: 34.8 g/dL (ref 30.0–36.0)
MCV: 93 fL (ref 80.0–100.0)
Platelets: 302 10*3/uL (ref 150–400)
RBC: 3.86 MIL/uL — ABNORMAL LOW (ref 3.87–5.11)
RDW: 13.2 % (ref 11.5–15.5)
WBC: 5.9 10*3/uL (ref 4.0–10.5)
nRBC: 0 % (ref 0.0–0.2)

## 2021-08-13 LAB — RAPID URINE DRUG SCREEN, HOSP PERFORMED
Amphetamines: NOT DETECTED
Barbiturates: NOT DETECTED
Benzodiazepines: NOT DETECTED
Cocaine: NOT DETECTED
Opiates: NOT DETECTED
Tetrahydrocannabinol: POSITIVE — AB

## 2021-08-13 LAB — TYPE AND SCREEN
ABO/RH(D): B POS
Antibody Screen: NEGATIVE

## 2021-08-13 LAB — RESP PANEL BY RT-PCR (FLU A&B, COVID) ARPGX2
Influenza A by PCR: NEGATIVE
Influenza B by PCR: NEGATIVE
SARS Coronavirus 2 by RT PCR: NEGATIVE

## 2021-08-13 LAB — POCT FERN TEST: POCT Fern Test: POSITIVE

## 2021-08-13 LAB — GROUP B STREP BY PCR: Group B strep by PCR: NEGATIVE

## 2021-08-13 MED ORDER — PHENYLEPHRINE 40 MCG/ML (10ML) SYRINGE FOR IV PUSH (FOR BLOOD PRESSURE SUPPORT)
80.0000 ug | PREFILLED_SYRINGE | INTRAVENOUS | Status: DC | PRN
  Administered 2021-08-13: 80 ug via INTRAVENOUS

## 2021-08-13 MED ORDER — TETANUS-DIPHTH-ACELL PERTUSSIS 5-2.5-18.5 LF-MCG/0.5 IM SUSY: 0.5000 mL | PREFILLED_SYRINGE | Freq: Once | INTRAMUSCULAR | Status: DC

## 2021-08-13 MED ORDER — ACETAMINOPHEN 325 MG PO TABS
650.0000 mg | ORAL_TABLET | ORAL | Status: DC | PRN
  Administered 2021-08-15 (×2): 650 mg via ORAL
  Filled 2021-08-13 (×2): qty 2

## 2021-08-13 MED ORDER — DIBUCAINE (PERIANAL) 1 % EX OINT: 1.0000 "application " | TOPICAL_OINTMENT | CUTANEOUS | Status: DC | PRN

## 2021-08-13 MED ORDER — OXYCODONE-ACETAMINOPHEN 5-325 MG PO TABS: 1.0000 | ORAL_TABLET | ORAL | Status: DC | PRN

## 2021-08-13 MED ORDER — PRENATAL MULTIVITAMIN CH
1.0000 | ORAL_TABLET | Freq: Every day | ORAL | Status: DC
  Administered 2021-08-14 – 2021-08-15 (×2): 1 via ORAL
  Filled 2021-08-13 (×2): qty 1

## 2021-08-13 MED ORDER — MEASLES, MUMPS & RUBELLA VAC IJ SOLR: 0.5000 mL | Freq: Once | INTRAMUSCULAR | Status: DC

## 2021-08-13 MED ORDER — DIPHENHYDRAMINE HCL 50 MG/ML IJ SOLN: 12.5000 mg | INTRAMUSCULAR | Status: DC | PRN

## 2021-08-13 MED ORDER — PHENYLEPHRINE 40 MCG/ML (10ML) SYRINGE FOR IV PUSH (FOR BLOOD PRESSURE SUPPORT): 80.0000 ug | PREFILLED_SYRINGE | INTRAVENOUS | Status: DC | PRN

## 2021-08-13 MED ORDER — LIDOCAINE HCL (PF) 1 % IJ SOLN: 30.0000 mL | INTRAMUSCULAR | Status: DC | PRN

## 2021-08-13 MED ORDER — PHENYLEPHRINE 40 MCG/ML (10ML) SYRINGE FOR IV PUSH (FOR BLOOD PRESSURE SUPPORT)
PREFILLED_SYRINGE | INTRAVENOUS | Status: AC
  Filled 2021-08-13: qty 10

## 2021-08-13 MED ORDER — LACTATED RINGERS IV SOLN: INTRAVENOUS | Status: DC

## 2021-08-13 MED ORDER — EPHEDRINE 5 MG/ML INJ: 10.0000 mg | INTRAVENOUS | Status: DC | PRN

## 2021-08-13 MED ORDER — OXYCODONE-ACETAMINOPHEN 5-325 MG PO TABS
1.0000 | ORAL_TABLET | ORAL | Status: DC | PRN
  Administered 2021-08-13: 1 via ORAL
  Filled 2021-08-13: qty 1

## 2021-08-13 MED ORDER — ACETAMINOPHEN 325 MG PO TABS: 650.0000 mg | ORAL_TABLET | ORAL | Status: DC | PRN

## 2021-08-13 MED ORDER — LIDOCAINE HCL (PF) 1 % IJ SOLN
INTRAMUSCULAR | Status: DC | PRN
  Administered 2021-08-13: 11 mL via EPIDURAL

## 2021-08-13 MED ORDER — SODIUM CHLORIDE 0.9 % IV SOLN
25.0000 mg | Freq: Once | INTRAVENOUS | Status: AC
  Administered 2021-08-13: 25 mg via INTRAVENOUS
  Filled 2021-08-13: qty 1

## 2021-08-13 MED ORDER — ONDANSETRON HCL 4 MG PO TABS: 4.0000 mg | ORAL_TABLET | ORAL | Status: DC | PRN

## 2021-08-13 MED ORDER — SIMETHICONE 80 MG PO CHEW: 80.0000 mg | CHEWABLE_TABLET | ORAL | Status: DC | PRN

## 2021-08-13 MED ORDER — OXYTOCIN BOLUS FROM INFUSION
333.0000 mL | Freq: Once | INTRAVENOUS | Status: AC
  Administered 2021-08-13: 333 mL via INTRAVENOUS

## 2021-08-13 MED ORDER — TRANEXAMIC ACID-NACL 1000-0.7 MG/100ML-% IV SOLN
1000.0000 mg | INTRAVENOUS | Status: AC
  Administered 2021-08-13: 1000 mg via INTRAVENOUS

## 2021-08-13 MED ORDER — FENTANYL-BUPIVACAINE-NACL 0.5-0.125-0.9 MG/250ML-% EP SOLN
EPIDURAL | Status: AC
  Filled 2021-08-13: qty 250

## 2021-08-13 MED ORDER — DIPHENHYDRAMINE HCL 25 MG PO CAPS: 25.0000 mg | ORAL_CAPSULE | Freq: Four times a day (QID) | ORAL | Status: DC | PRN

## 2021-08-13 MED ORDER — COCONUT OIL OIL
1.0000 "application " | TOPICAL_OIL | Status: DC | PRN
  Administered 2021-08-13: 1 via TOPICAL

## 2021-08-13 MED ORDER — MAGNESIUM HYDROXIDE 400 MG/5ML PO SUSP: 30.0000 mL | ORAL | Status: DC | PRN

## 2021-08-13 MED ORDER — OXYCODONE-ACETAMINOPHEN 5-325 MG PO TABS: 2.0000 | ORAL_TABLET | ORAL | Status: DC | PRN

## 2021-08-13 MED ORDER — FENTANYL CITRATE (PF) 100 MCG/2ML IJ SOLN
50.0000 ug | INTRAMUSCULAR | Status: DC | PRN
  Administered 2021-08-13: 50 ug via INTRAVENOUS
  Administered 2021-08-13: 100 ug via INTRAVENOUS
  Filled 2021-08-13 (×2): qty 2

## 2021-08-13 MED ORDER — FENTANYL-BUPIVACAINE-NACL 0.5-0.125-0.9 MG/250ML-% EP SOLN
12.0000 mL/h | EPIDURAL | Status: DC | PRN
  Administered 2021-08-13: 12 mL/h via EPIDURAL

## 2021-08-13 MED ORDER — WITCH HAZEL-GLYCERIN EX PADS
1.0000 "application " | MEDICATED_PAD | CUTANEOUS | Status: DC | PRN
  Administered 2021-08-13: 1 via TOPICAL

## 2021-08-13 MED ORDER — ONDANSETRON HCL 4 MG/2ML IJ SOLN: 4.0000 mg | INTRAMUSCULAR | Status: DC | PRN

## 2021-08-13 MED ORDER — ONDANSETRON HCL 4 MG/2ML IJ SOLN
4.0000 mg | Freq: Four times a day (QID) | INTRAMUSCULAR | Status: DC | PRN
  Administered 2021-08-13: 4 mg via INTRAVENOUS
  Filled 2021-08-13: qty 2

## 2021-08-13 MED ORDER — LACTATED RINGERS IV SOLN: 500.0000 mL | INTRAVENOUS | Status: DC | PRN

## 2021-08-13 MED ORDER — IBUPROFEN 600 MG PO TABS
600.0000 mg | ORAL_TABLET | Freq: Four times a day (QID) | ORAL | Status: DC
  Administered 2021-08-13 – 2021-08-15 (×7): 600 mg via ORAL
  Filled 2021-08-13 (×6): qty 1

## 2021-08-13 MED ORDER — TERBUTALINE SULFATE 1 MG/ML IJ SOLN
0.2500 mg | Freq: Once | INTRAMUSCULAR | Status: AC
  Administered 2021-08-13: 0.25 mg via SUBCUTANEOUS

## 2021-08-13 MED ORDER — FLEET ENEMA 7-19 GM/118ML RE ENEM: 1.0000 | ENEMA | RECTAL | Status: DC | PRN

## 2021-08-13 MED ORDER — OXYTOCIN-SODIUM CHLORIDE 30-0.9 UT/500ML-% IV SOLN
2.5000 [IU]/h | INTRAVENOUS | Status: DC
  Administered 2021-08-13: 2.5 [IU]/h via INTRAVENOUS
  Filled 2021-08-13: qty 500

## 2021-08-13 MED ORDER — SOD CITRATE-CITRIC ACID 500-334 MG/5ML PO SOLN: 30.0000 mL | ORAL | Status: DC | PRN

## 2021-08-13 MED ORDER — BENZOCAINE-MENTHOL 20-0.5 % EX AERO
1.0000 "application " | INHALATION_SPRAY | CUTANEOUS | Status: DC | PRN
  Administered 2021-08-13: 1 via TOPICAL
  Filled 2021-08-13: qty 56

## 2021-08-13 MED ORDER — LACTATED RINGERS IV SOLN: 500.0000 mL | Freq: Once | INTRAVENOUS | Status: DC

## 2021-08-13 NOTE — MAU Provider Note (Signed)
Vertex presentation verified by bedside ultrasound. Gaylan Gerold, CNM, MSN, [place] Certified Nurse Midwife, [facility]

## 2021-08-13 NOTE — MAU Note (Signed)
...  Shirley Turner is a 31 y.o. at [redacted]w[redacted]d here in MAU via EMS reporting: SROM around 1000. She is having occasional vaginal pressure that she rates 3/10. +FM. No VB.   Clear fluids. GBS not resulted.

## 2021-08-13 NOTE — Anesthesia Procedure Notes (Signed)
Epidural Patient location during procedure: OB Start time: 08/13/2021 6:08 PM End time: 08/13/2021 6:22 PM  Staffing Anesthesiologist: Lynda Rainwater, MD Performed: anesthesiologist   Preanesthetic Checklist Completed: patient identified, IV checked, site marked, risks and benefits discussed, surgical consent, monitors and equipment checked, pre-op evaluation and timeout performed  Epidural Patient position: sitting Prep: ChloraPrep Patient monitoring: heart rate, cardiac monitor, continuous pulse ox and blood pressure Approach: midline Location: L2-L3 Injection technique: LOR saline  Needle:  Needle type: Tuohy  Needle gauge: 17 G Needle length: 9 cm Needle insertion depth: 6 cm Catheter type: closed end flexible Catheter size: 20 Guage Catheter at skin depth: 10 cm Test dose: negative  Assessment Events: blood not aspirated, injection not painful, no injection resistance, no paresthesia and negative IV test  Additional Notes Reason for block:procedure for pain

## 2021-08-13 NOTE — Discharge Summary (Signed)
Postpartum Discharge Summary  Date of Service updated     Patient Name: Shirley Turner DOB: May 06, 1991 MRN: [id number]  Date of admission: 08/13/2021 Delivery date:08/13/2021  Delivering provider: Manya Silvas  Date of discharge: 08/15/2021  Admitting diagnosis: Leakage of amniotic fluid [O42.90] Intrauterine pregnancy: [redacted]w[redacted]d    Secondary diagnosis:  Principal Problem:   Leakage of amniotic fluid Active Problems:   Cannabis use disorder, moderate, dependence (HIsabela   Asthma   Vaginal delivery Marijuana use in pregnancy  Additional problems: Fetal Heart rate decelerations.    Discharge diagnosis: Term Pregnancy Delivered                                              Post partum procedures: None Augmentation: N/A Complications: None  Hospital course: Onset of Labor With Vaginal Delivery      31y.o. yo [id number] admitted in Latent Labor on 08/13/2021. Patient had an uncomplicated labor course as follows:  Membrane Rupture Time/Date: 10:00 AM ,08/13/2021   Delivery Method:Vaginal, Spontaneous  Episiotomy: None  Lacerations:  None  Patient had an uncomplicated postpartum course.  She is ambulating, tolerating a regular diet, passing flatus, and urinating well. Patient is discharged home in stable condition on 08/15/21.  Newborn Data: Birth date:08/13/2021  Birth time:6:51 PM  Gender:Female  Living status:Living  Apgars:8 ,9  Weight:2685 g   Magnesium Sulfate received: No BMZ received: No Rhophylac:N/A MMR:N/A T-DaP:Given prenatally Flu: No Transfusion:No  Physical exam  Vitals:   08/14/21 0857 08/14/21 1418 08/14/21 2019 08/15/21 0514  BP: 109/68 109/75 107/76 (!) 101/58  Pulse:  62 78 (!) 59  Resp:  20 18 18   Temp:  98.8 F (37.1 C) 98.8 F (37.1 C) 98.3 F (36.8 C)  TempSrc:  Oral Oral Oral  SpO2:   100% 100%  Weight:      Height:       General: alert, cooperative, and no distress Lochia: appropriate Uterine Fundus: firm Incision:  N/A DVT Evaluation: No significant calf/ankle edema. Labs: Lab Results  Component Value Date   WBC 5.9 08/13/2021   HGB 12.5 08/13/2021   HCT 35.9 (L) 08/13/2021   MCV 93.0 08/13/2021   PLT 302 08/13/2021   CMP Latest Ref Rng & Units 01/12/2021  Glucose 70 - 99 mg/dL 85  BUN 6 - 20 mg/dL 7  Creatinine 0.44 - 1.00 mg/dL 0.75  Sodium 135 - 145 mmol/L 134(L)  Potassium 3.5 - 5.1 mmol/L 3.3(L)  Chloride 98 - 111 mmol/L 100  CO2 22 - 32 mmol/L 22  Calcium 8.9 - 10.3 mg/dL 9.4  Total Protein 6.5 - 8.1 g/dL 7.7  Total Bilirubin 0.3 - 1.2 mg/dL 0.8  Alkaline Phos 38 - 126 U/L 51  AST 15 - 41 U/L 37  ALT 0 - 44 U/L 50(H)   EdFlavia Shippercore: Edinburgh Postnatal Depression Scale Screening Tool 08/14/2021  I have been able to laugh and see the funny side of things. 0  I have looked forward with enjoyment to things. 0  I have blamed myself unnecessarily when things went wrong. 2  I have been anxious or worried for no good reason. 2  I have felt scared or panicky for no good reason. 2  Things have been getting on top of me. 2  I have been so unhappy that I have had difficulty  sleeping. 2  I have felt sad or miserable. 2  I have been so unhappy that I have been crying. 1  The thought of harming myself has occurred to me. 0  Edinburgh Postnatal Depression Scale Total 13     After visit meds:  Allergies as of 08/15/2021   No Known Allergies      Medication List     STOP taking these medications    Doxylamine-Pyridoxine 10-10 MG Tbec Commonly known as: Diclegis   ondansetron 4 MG disintegrating tablet Commonly known as: Zofran ODT       TAKE these medications    acetaminophen 325 MG tablet Commonly known as: Tylenol Take 2 tablets (650 mg total) by mouth every 4 (four) hours as needed (for pain scale < 4).   albuterol 108 (90 Base) MCG/ACT inhaler Commonly known as: VENTOLIN HFA Inhale 1-2 puffs into the lungs every 6 (six) hours as needed for wheezing or shortness of  breath.   ibuprofen 600 MG tablet Commonly known as: ADVIL Take 1 tablet (600 mg total) by mouth every 6 (six) hours as needed.         Discharge home in stable condition Infant Feeding: Breast Infant Disposition:Hopefully home with mother Discharge instruction: per After Visit Summary and Postpartum booklet. Activity: Advance as tolerated. Pelvic rest for 6 weeks.  Diet: routine diet Future Appointments: Future Appointments  Date Time Provider [place]  08/17/2021  2:35 PM Imagene Riches, CNM [facility] None   Follow up Visit:  Elizabeth. Schedule an appointment as soon as possible for a visit.   Contact information: [place] [address] [phone]                  Please schedule this patient for a In person postpartum visit in 4 weeks with the following provider: Any provider. Additional Postpartum F/U: None   Low risk pregnancy complicated by:  Nothing Delivery mode:  Vaginal, Spontaneous  Anticipated Birth Control:   NFP/LAM   08/15/2021 Patriciaann Clan, DO

## 2021-08-13 NOTE — Progress Notes (Signed)
Shirley Turner is a 31 y.o. G3P2002 at [redacted]w[redacted]d.  Subjective: Moderately uncomfortable w/ UC's. Coping well. Declines meds.   Objective: BP 114/69    Pulse 61    Temp 98 F (36.7 C)    Resp 17    Ht 5\' 3"  (1.6 m)    Wt 76.6 kg    LMP 11/24/2020 (Exact Date) Comment: LMP heavier than normal   SpO2 100%    BMI 29.92 kg/m    FHT:  FHR: 135 bpm, variability: mod,  accelerations:  15x15,  decelerations:  rare mild variable. Tracing MHR ww/ maternal position change.  UC:   Q 3-5 minutes, mod Dilation: 2 Effacement (%): Thick Station: -3 Presentation: Vertex Exam by:: Cisco: Results for orders placed or performed during the hospital encounter of 08/13/21 (from the past 24 hour(s))  Resp Panel by RT-PCR (Flu A&B, Covid) Nasopharyngeal Swab     Status: None   Collection Time: 08/13/21 10:53 AM   Specimen: Nasopharyngeal Swab; Nasopharyngeal(NP) swabs in vial transport medium  Result Value Ref Range   SARS Coronavirus 2 by RT PCR NEGATIVE NEGATIVE   Influenza A by PCR NEGATIVE NEGATIVE   Influenza B by PCR NEGATIVE NEGATIVE  Urine rapid drug screen (hosp performed)     Status: Abnormal   Collection Time: 08/13/21 10:59 AM  Result Value Ref Range   Opiates NONE DETECTED NONE DETECTED   Cocaine NONE DETECTED NONE DETECTED   Benzodiazepines NONE DETECTED NONE DETECTED   Amphetamines NONE DETECTED NONE DETECTED   Tetrahydrocannabinol POSITIVE (A) NONE DETECTED   Barbiturates NONE DETECTED NONE DETECTED  Type and screen [place]     Status: None   Collection Time: 08/13/21 11:20 AM  Result Value Ref Range   ABO/RH(D) B POS    Antibody Screen NEG    Sample Expiration      08/16/2021,2359 Performed at [facility], [address], [place], [place] [address]   CBC     Status: Abnormal   Collection Time: 08/13/21 11:22 AM  Result Value Ref Range   WBC 5.9 4.0 - 10.5 K/uL   RBC 3.86 (L) 3.87 - 5.11 MIL/uL   Hemoglobin 12.5 12.0 - 15.0 g/dL    HCT 35.9 (L) 36.0 - 46.0 %   MCV 93.0 80.0 - 100.0 fL   MCH 32.4 26.0 - 34.0 pg   MCHC 34.8 30.0 - 36.0 g/dL   RDW 13.2 11.5 - 15.5 %   Platelets 302 150 - 400 K/uL   nRBC 0.0 0.0 - 0.2 %  Fern Test     Status: None   Collection Time: 08/13/21 11:34 AM  Result Value Ref Range   POCT Fern Test Positive = ruptured amniotic membanes     Assessment / Plan: [redacted]w[redacted]d week IUP Labor: Early, ROM x 3.5 hours. No evidence of Triple I.  Fetal Wellbeing:  Category I Pain Control:  Comfort measures. Hopes to avoid epidural.  Anticipated MOD:  SVD Hx SUD: Reports Marijuana use in pregnancy and Cocaine prior. UDS + Marijuana. SW consult PP. Cautioned pt not to use cocaine especially while breastfeeding. May cause neonatal death. Pt verbalizes understanding.   Tamala Julian, [place], [place] 08/13/2021 1:33 PM

## 2021-08-13 NOTE — Progress Notes (Signed)
Shirley Turner is a 31 y.o. G3P2002 at [redacted]w[redacted]d.  Subjective: Very uncomfortable w/ UC's. Not coping well. Asking for more Fentanyl. Feeling more pack pain and pelvic pressure. Declines epidural. Worries about back pain.   Objective: BP 105/64    Pulse (!) 102    Temp 97.6 F (36.4 C)    Resp 18    Ht 5\' 3"  (1.6 m)    Wt 76.6 kg    LMP 11/24/2020 (Exact Date) Comment: LMP heavier than normal   SpO2 100%    BMI 29.92 kg/m    FHT:  FHR: 125 bpm, variability: mod,  accelerations:  15x15,  decelerations:  possible variables. Very difficult to trace due to maternal positions and mvmt. RN and CNM adjusting and holding monitor. May need FSE.  UC:   Q  2-5 minutes, strong Dilation: 3.5 Effacement (%): 90 Station: -2 Presentation: Vertex Exam by:: Cisco: None  Assessment / Plan: [redacted]w[redacted]d week IUP Labor: Early. Contractions closely and stronger w/out augmentation. Small amount of labor progress. ROM x 7 hours. No evidence of Triple I.  Fetal Wellbeing:  Category I-II, Reassured by mod variability and accels. Will continue to adjust monitor and discussed possible need for FSE to assess [facility].  Pain Control:  Fentanyl.  Anticipated MOD:  SVD  Manya Silvas, [place] 08/13/2021 4:47 PM

## 2021-08-13 NOTE — H&P (Signed)
HPI: Shirley Turner is a 31 y.o. year old G40P2002 female at [redacted]w[redacted]d weeks gestation who presents to MAU reporting Spontaneous rupture of membranes, clear fluid at 1000. Grossly ruptured per MAU provider. Fern +. Rare contractions. No VB, Gets prenatal care at [facility].   Hx MJ during pregnancy. Reports cocaine, Ecstasy, ETOH prior to pregnancy. UDS only pos MJ during pregnancy.   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   Asthma 06/15/2019   Alcohol use 8 shots [place] 04/2019 06/15/2019   Depression, major, single episode, complete remission ([place]) dx'd 2016 06/15/2019   Adjustment disorder with mixed anxiety and depressed mood 02/28/2019   Tobacco use disorder 10-12 cpd 06/16/2018   Cocaine use disorder, moderate, dependence (HCC)--last use 04/09/19 06/15/2018   Cannabis use disorder, moderate, dependence ([place]) 06/15/2018   Severe recurrent major depression without psychotic features (HCC)--No meds currently 06/14/2018    Nursing Staff Provider  Office Location  [facility] Dating  Lmp, [place]  Language  English Anatomy US  [facility] nml, fibroid 4cm  Flu Vaccine  declines Genetic Screen  NIPS: nml XY  TDaP vaccine   07/05/21 Hgb A1C or  GTT Early : NA Third trimester : 127  Covid    LAB RESULTS   Rhogam  n/a Blood Type B/Positive/-- (07/21 1033)   Feeding Plan Breast Antibody Neg  Contraception  Rubella 1.07 (07/21 1033)  Circumcision  RPR Non Reactive (07/21 1033)   Pediatrician   HBsAg Negative (07/21 1033)   Support Person Marcello Moores HIV Non Reactive (07/21 1033)  Prenatal Classes  Varicella Imm    GBS  (For PCN allergy, check sensitivities)   BTL Consent     VBAC Consent n/a Pap  02/10/21 nml    Hgb Electro  neg  Pelvis Tested 5#14oz          OB History     Gravida  3   Para  2    Term  2   Preterm  0   AB  0   Living  2      SAB  0   IAB  0   Ectopic  0   Multiple  0   Live Births  2          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   Anemia    Anxiety    Asthma    Depression    Headache    Past Surgical History:  Procedure Laterality Date   TONSILLECTOMY AND ADENOIDECTOMY     Family History: family history includes ADD / ADHD in her half-sister; Arthritis in her paternal grandmother; Bipolar disorder in her sister; Congestive Heart Failure in her maternal grandmother; Depression in her sister; Diabetes in her maternal grandmother and paternal grandmother; Hypertension in her maternal grandmother, mother, and paternal grandmother; Kidney disease in her maternal grandmother. Social History:  reports that she has quit smoking. Her smoking use included cigarettes. She smoked an average of .25 packs per day. She has never used smokeless tobacco. She reports that she does not currently use alcohol after a past usage of about 8.0 standard drinks per week. She reports current drug use. Drug: Marijuana.     Maternal Diabetes: No Genetic Screening: Normal Maternal Ultrasounds/Referrals: Absent  nasal bone Fetal Ultrasounds or other Referrals:  Other: F/U US ordered 2/2 fibroid. Not completed. Maternal Substance Abuse:  Yes:  Type: Marijuana, Cocaine, Other:  Significant Maternal Medications:  Meds include: Other:  Significant Maternal Lab Results:  Other:  Other Comments:   GBS pending. Hx MJ during pregnancy.  Reports cocaine, Ecstasy, ETOH prior to pregnancy. UDS only pos MJ during pregnancy. UDS pending.   Review of Systems Maternal Medical History:  Reason for admission: Rupture of membranes and contractions.   Contractions: Onset was 1-2 hours ago.   Frequency: irregular.   Perceived severity is moderate.   Fetal activity: Perceived fetal activity is normal.    Prenatal complications: Substance abuse.   No bleeding or PIH.   Prenatal Complications - Diabetes: none.  Dilation: 1 Effacement (%): 50 Exam by:: J walker CNM Blood pressure 119/71, pulse 78, temperature 98.3 F (36.8 C), temperature source Oral, resp. rate 18, last menstrual period 11/24/2020, SpO2 100 %. Maternal Exam:  Uterine Assessment: Contraction strength is moderate.  Contraction frequency is irregular.  Abdomen: Patient reports no abdominal tenderness. Estimated fetal weight is 6lb.   Fetal presentation: vertex Introitus: Normal vulva. Normal vagina.  Ferning test: positive.  Amniotic fluid character: clear. Pelvis: adequate for delivery.   Cervix: Cervix evaluated by digital exam.     Fetal Exam Fetal Monitor Review: Baseline rate: 135.  Variability: moderate (6-25 bpm).   Pattern: accelerations present and no decelerations.   Fetal State Assessment: Category I - tracings are normal.  Physical Exam Vitals and nursing note reviewed. Exam conducted with a chaperone present.  Constitutional:      General: She is in acute distress.     Appearance: Normal appearance. She is normal weight. She is not ill-appearing or toxic-appearing.  HENT:     Head: Normocephalic.     Mouth/Throat:     Mouth: Mucous membranes are moist.  Eyes:     General: No scleral icterus. Cardiovascular:     Rate and Rhythm: Normal rate.  Pulmonary:     Effort: Pulmonary effort is normal. No respiratory distress.  Abdominal:     Palpations: Abdomen is soft.     Tenderness: There is no abdominal tenderness.  Genitourinary:    General: Normal vulva.  Musculoskeletal:     Right lower leg: No edema.     Left lower leg: No edema.  Skin:    General: Skin is warm and dry.     Findings: No lesion.  Neurological:     General: No focal deficit present.     Mental Status: She is alert and oriented to person, place, and time.  Psychiatric:        Mood and Affect: Mood normal.    Prenatal  labs: ABO, Rh: B/Positive/-- (07/21 1033) Antibody: Comment, See Final Results (07/21 1033) Rubella: 1.07 (07/21 1033) RPR: Non Reactive (11/30 1600)  HBsAg: Negative (07/21 1033)  HIV: Non Reactive (11/30 1600)  GBS: Pending. PCR collected    Assessment: 1. Labor: PROM, now contracting more.  2. Fetal Wellbeing: Category I  3. Pain Control: Comfort measures 4. GBS: Pending 5. 37.3 week IUP 6. Hx SUD  Plan:  1. Admit to BS per consult with MD 2. Routine L&D orders 3. Analgesia/anesthesia PRN  4. 5 P's, UDS 5. PCN if GBS PCR pos.  6. Augment PRN 7. Intermittent auscultation. May ambulate, shower, saline lock.   Manya Silvas 08/13/2021, 11:37 AM

## 2021-08-13 NOTE — Anesthesia Preprocedure Evaluation (Signed)
Anesthesia Evaluation  Patient identified by MRN, date of birth, ID band Patient awake    Reviewed: Allergy & Precautions, H&P , NPO status , Patient's Chart, lab work & pertinent test results  History of Anesthesia Complications Negative for: history of anesthetic complications  Airway Mallampati: II  TM Distance: >3 FB Neck ROM: Full    Dental no notable dental hx.    Pulmonary asthma , former smoker,    Pulmonary exam normal breath sounds clear to auscultation       Cardiovascular Normal cardiovascular exam Rhythm:Regular Rate:Normal     Neuro/Psych  Headaches, Anxiety Depression    GI/Hepatic   Endo/Other    Renal/GU      Musculoskeletal   Abdominal   Peds  Hematology   Anesthesia Other Findings   Reproductive/Obstetrics (+) Pregnancy                             Anesthesia Physical  Anesthesia Plan  ASA: II  Anesthesia Plan: Epidural   Post-op Pain Management:    Induction:   PONV Risk Score and Plan:   Airway Management Planned:   Additional Equipment:   Intra-op Plan:   Post-operative Plan:   Informed Consent: I have reviewed the patients History and Physical, chart, labs and discussed the procedure including the risks, benefits and alternatives for the proposed anesthesia with the patient or authorized representative who has indicated his/her understanding and acceptance.       Plan Discussed with: Anesthesiologist  Anesthesia Plan Comments:         Anesthesia Quick Evaluation

## 2021-08-13 NOTE — Progress Notes (Signed)
Shirley Turner is a 31 y.o. G3P2002 at [redacted]w[redacted]d.  Subjective: Increased pelvic pressure, N/V.  Objective: BP 115/66    Pulse 61    Temp 98 F (36.7 C)    Resp 18    Ht 5\' 3"  (1.6 m)    Wt 76.6 kg    LMP 11/24/2020 (Exact Date) Comment: LMP heavier than normal   SpO2 100%    BMI 29.92 kg/m    FHT:  FHR: 125 bpm, variability: mod by intermittent monitoring.  UC:   Q 2-3 minutes, strong Dilation: 2 Effacement (%): 80 Station: -2 Presentation: Vertex Exam by:: Cisco: None  Assessment / Plan: [redacted]w[redacted]d week IUP Labor: Early, contractions much closer and stronger.  GBS PCR neg.  Fetal Wellbeing:  Category I Pain Control:  Comfort measures. Requesting IV pain meds.  Anticipated MOD:  SVD N/V: [place], [place], [place] 08/13/2021 3:52 PM

## 2021-08-13 NOTE — Progress Notes (Signed)
Pt says she has smoked marijuana but denies any other drugs

## 2021-08-14 LAB — RPR: RPR Ser Ql: NONREACTIVE

## 2021-08-14 LAB — CULTURE, BETA STREP (GROUP B ONLY): Strep Gp B Culture: NEGATIVE

## 2021-08-14 NOTE — Anesthesia Postprocedure Evaluation (Signed)
Anesthesia Post Note  Patient: Shirley Turner  Procedure(s) Performed: AN AD HOC LABOR EPIDURAL     Patient location during evaluation: Mother Baby Anesthesia Type: Epidural Level of consciousness: awake and alert and oriented Pain management: satisfactory to patient Vital Signs Assessment: post-procedure vital signs reviewed and stable Respiratory status: respiratory function stable Cardiovascular status: stable Postop Assessment: no headache, no backache, epidural receding, patient able to bend at knees, no signs of nausea or vomiting, adequate PO intake and able to ambulate Anesthetic complications: no   No notable events documented.  Last Vitals:  Vitals:   08/14/21 0200 08/14/21 0625  BP: 101/66 106/71  Pulse: 80   Resp: 16 16  Temp: 37.1 C 36.9 C  SpO2: 100% 100%    Last Pain:  Vitals:   08/14/21 0738  TempSrc:   PainSc: 0-No pain   Pain Goal:                   Gigi Onstad

## 2021-08-14 NOTE — Progress Notes (Signed)
Post Partum Day 1 Subjective: no complaints, up ad lib, voiding, tolerating PO, and + flatus  Objective: Blood pressure 101/66, pulse 80, temperature 98.8 F (37.1 C), temperature source Oral, resp. rate 16, height 5\' 3"  (1.6 m), weight 76.6 kg, last menstrual period 11/24/2020, SpO2 100 %, unknown if currently breastfeeding.  Physical Exam:  General: alert, cooperative, and no distress Lochia: appropriate Uterine Fundus: firm Incision: n/a DVT Evaluation: No evidence of DVT seen on physical exam.  Recent Labs    08/13/21 1122  HGB 12.5  HCT 35.9*    Assessment/Plan: Patient doing well without complaint. Reports infant is feeding well overnight.   SW consult pending give hx drug use in pregnancy. Plans to follow up with [facility] for postpartum care   LOS: 1 day   [place] 08/14/2021, 6:09 AM

## 2021-08-14 NOTE — Clinical Social Work Maternal (Signed)
CLINICAL SOCIAL WORK MATERNAL/CHILD NOTE  Patient Details  Name: Shirley Turner MRN: [id number] Date of Birth: 05/03/1991  Date:  08-Feb-2022  Clinical Social Worker Initiating Note:  Darra Lis, [place] Date/Time: Initiated:  08/14/21/0930     Child's Name:  Shirley Turner   Biological Parents:  Mother, Father Vearl Allbaugh 07/21/1985)   Need for Interpreter:  None   Reason for Referral:  Current Substance Use/Substance Use During Pregnancy     Address:  [address] [place] [address]    Phone number:  [phone] (home)     Additional phone number:   Household Members/Support Persons (HM/SP):       HM/SP Name Relationship DOB or Age  HM/SP -1        HM/SP -2        HM/SP -3        HM/SP -4        HM/SP -5        HM/SP -6        HM/SP -7        HM/SP -8          Natural Supports (not living in the home):  Parent   Professional Supports: None   Employment: Full-time   Type of Work: Editor, commissioning at [employer]:  [place] arranged:    Museum/gallery curator Resources:  Medicaid   Other Resources:  Physicist, medical  , [place] Considerations Which May Impact Care:    Strengths:  Ability to meet basic needs  , Home prepared for child  , Pediatrician chosen   Psychotropic Medications:         Pediatrician:    [employer]  Pediatrician List:   [place] Other ([place])  [facility]      Pediatrician Fax Number:    Risk Factors/Current Problems:  Substance Use     Cognitive State:  Alert  , Insightful  , Linear Thinking     Mood/Affect:  Interested  , Comfortable     CSW Assessment: CSW consulted for THC, anxiety and [place] of 12. CSW met with MOB to assess and offer support. CSW introduced self and role. CSW observed MOB holding infant. CSW informed MOB of the reason for consult. MOB was  understanding and easily engaged. MOB reported she is currently doing well and had a good pregnancy. MOB shared that her labor experience was not as expected. MOB stated she resides with her two daughters. MOB is employed full time, receiving WIC and food stamp resources. MOB disclosed she has a history of anxiety and depression dating back to 2017. MOB expressed she did not have any symptoms until the final month of pregnancy. MOB stated it was "nothing major" and she copes by smoking and talking to God. MOB shared she is not interested in medications or therapy. MOB reported she naturally isolates herself and likes to be in her own space. CSW expressed understanding and discussed additional coping mechanisms. MOB originally stated she has no supports, however after further conversation MOB identified her parents as being supportive when needed. MOB expressed she has no local supports due to FOB being on the road for work. MOB denies any current SI, HI or DV.   MOB reported she smoked THC throughout the pregnancy. MOB stated she last smoked a  ago. MOB reported that in addition to helping with coping, it assisted with her nausea. MOB denies any additional substance use. CSW informed MOB of the hospital drug screen policy. MOB aware infant UDS is positive and the CDS will be followed. CSW informed MOB that a CPS report will be filed with [place]. MOB was understanding and stated she has a CPS case in 2020 which consisted of false allegations. MOB stated the case was closed.   CSW provided education regarding the baby blues period versus PPD and provided mental health resources. CSW provided the New Mom Checklist and encouraged MOB to self evaluate. CSW provided review of Sudden Infant Death Syndrome (SIDS) precautions. MOB stated she has all infant essentials, including a car seat and crib. MOB denies any barriers to infant follow-up care. MOB declined any additional community referrals.   CSW  filed report with [facility] CPS. CSW will continue to follow CDS and make an additional report if warranted. CSW identifies no further need for intervention and no barriers to discharge at this time.  CSW Plan/Description:  No Further Intervention Required/No Barriers to Discharge, CSW Will Continue to Monitor Umbilical Cord Tissue Drug Screen Results and Make Report if Warranted, Child Protective Service Report  , [place], Perinatal Mood and Anxiety Disorder (PMADs) Education, Sudden Infant Death Syndrome (SIDS) Education, Other Information/Referral to [employer], Other Patient/Family Education    Waylan Boga, [place] 08/14/2021, 11:35 AM

## 2021-08-15 LAB — CULTURE, BETA STREP (GROUP B ONLY)

## 2021-08-15 MED ORDER — ACETAMINOPHEN 325 MG PO TABS: 650.0000 mg | ORAL_TABLET | ORAL | Status: DC | PRN

## 2021-08-15 MED ORDER — IBUPROFEN 600 MG PO TABS: 600.0000 mg | ORAL_TABLET | Freq: Four times a day (QID) | ORAL | 0 refills | Status: DC | PRN

## 2021-08-17 ENCOUNTER — Encounter: Payer: Medicaid Other | Admitting: Obstetrics

## 2021-08-23 DIAGNOSIS — Z419 Encounter for procedure for purposes other than remedying health state, unspecified: Secondary | ICD-10-CM | POA: Diagnosis not present

## 2021-08-28 ENCOUNTER — Telehealth (HOSPITAL_COMMUNITY): Payer: Self-pay | Admitting: *Deleted

## 2021-08-28 NOTE — Telephone Encounter (Signed)
Phone # is not working. No message could be left. Odis Hollingshead, RN 08-28-2021 2:00pm

## 2021-09-20 DIAGNOSIS — Z419 Encounter for procedure for purposes other than remedying health state, unspecified: Secondary | ICD-10-CM | POA: Diagnosis not present

## 2021-09-26 ENCOUNTER — Ambulatory Visit: Payer: Medicaid Other | Admitting: Obstetrics

## 2021-09-27 ENCOUNTER — Ambulatory Visit: Payer: Medicaid Other | Admitting: Obstetrics

## 2021-10-02 ENCOUNTER — Ambulatory Visit: Payer: Medicaid Other | Admitting: Obstetrics

## 2021-10-21 DIAGNOSIS — Z419 Encounter for procedure for purposes other than remedying health state, unspecified: Secondary | ICD-10-CM | POA: Diagnosis not present

## 2021-11-20 DIAGNOSIS — Z419 Encounter for procedure for purposes other than remedying health state, unspecified: Secondary | ICD-10-CM | POA: Diagnosis not present

## 2021-12-21 DIAGNOSIS — Z419 Encounter for procedure for purposes other than remedying health state, unspecified: Secondary | ICD-10-CM | POA: Diagnosis not present

## 2022-01-20 DIAGNOSIS — Z419 Encounter for procedure for purposes other than remedying health state, unspecified: Secondary | ICD-10-CM | POA: Diagnosis not present

## 2022-01-22 ENCOUNTER — Encounter (HOSPITAL_COMMUNITY): Payer: Self-pay | Admitting: Emergency Medicine

## 2022-01-22 ENCOUNTER — Ambulatory Visit (HOSPITAL_COMMUNITY)
Admission: EM | Admit: 2022-01-22 | Discharge: 2022-01-22 | Disposition: A | Discharge status: Home / Self Care | Payer: Medicaid Other | Attending: Emergency Medicine | Admitting: Emergency Medicine

## 2022-01-22 DIAGNOSIS — M7918 Myalgia, other site: Secondary | ICD-10-CM

## 2022-01-22 MED ORDER — CYCLOBENZAPRINE HCL 10 MG PO TABS: 10.0000 mg | ORAL_TABLET | Freq: Every day | ORAL | 0 refills | Status: DC

## 2022-01-22 MED ORDER — NAPROXEN 250 MG PO TABS: 500.0000 mg | ORAL_TABLET | Freq: Two times a day (BID) | ORAL | 0 refills | Status: DC

## 2022-01-22 MED ORDER — KETOROLAC TROMETHAMINE 30 MG/ML IJ SOLN
INTRAMUSCULAR | Status: AC
  Filled 2022-01-22: qty 1

## 2022-01-22 MED ORDER — KETOROLAC TROMETHAMINE 30 MG/ML IJ SOLN
30.0000 mg | Freq: Once | INTRAMUSCULAR | Status: AC
  Administered 2022-01-22: 30 mg via INTRAMUSCULAR
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
## 2022-01-22 NOTE — Discharge Instructions (Signed)
Your pain is most likely caused by irritation to the muscles, I do not believe any were to be broken at this time  You have been given an injection of Toradol here in the office to help reduce the inflammatory process that caused from muscle injury  Starting tomorrow take 2 naproxen tablets every morning and every evening for the next 5 days then you may use it as needed, this is to help the process noted above  You may use muscle relaxer at bedtime for additional comfort, be mindful this medication may make you drowsy You may use heating pad in 15 minute intervals as needed for additional comfort, within the first 2-3 days you may find comfort in using ice in 10-15 minutes over affected area  Begin stretching affected area daily for 10 minutes as tolerated to further loosen muscles   When lying down place pillow underneath and between knees for support  Can try sleeping without pillow on firm mattress   Practice good posture: head back, shoulders back, chest forward, pelvis back and weight distributed evenly on both legs  If pain persist after recommended treatment or reoccurs if may be beneficial to follow up with orthopedic specialist for evaluation, this doctor specializes in the bones and can manage your symptoms long-term with options such as but not limited to imaging, medications or physical therapy
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
## 2022-02-20 DIAGNOSIS — Z419 Encounter for procedure for purposes other than remedying health state, unspecified: Secondary | ICD-10-CM | POA: Diagnosis not present

## 2022-03-23 DIAGNOSIS — Z419 Encounter for procedure for purposes other than remedying health state, unspecified: Secondary | ICD-10-CM | POA: Diagnosis not present

## 2022-04-22 DIAGNOSIS — Z419 Encounter for procedure for purposes other than remedying health state, unspecified: Secondary | ICD-10-CM | POA: Diagnosis not present

## 2022-05-23 DIAGNOSIS — Z419 Encounter for procedure for purposes other than remedying health state, unspecified: Secondary | ICD-10-CM | POA: Diagnosis not present

## 2022-05-29 IMAGING — US US OB COMP +14 WK
2 series · 15 of 28 positions shown · non-contrast
Comparison: none

CLINICAL DATA: Second trimester pregnancy for fetal anatomy survey.

EXAM:
OBSTETRICAL ULTRASOUND >14 WKS

[Series 1: us ob comp + 14 wk · 14 of 86 slices shown (1 of 2)]
[im 1/86]
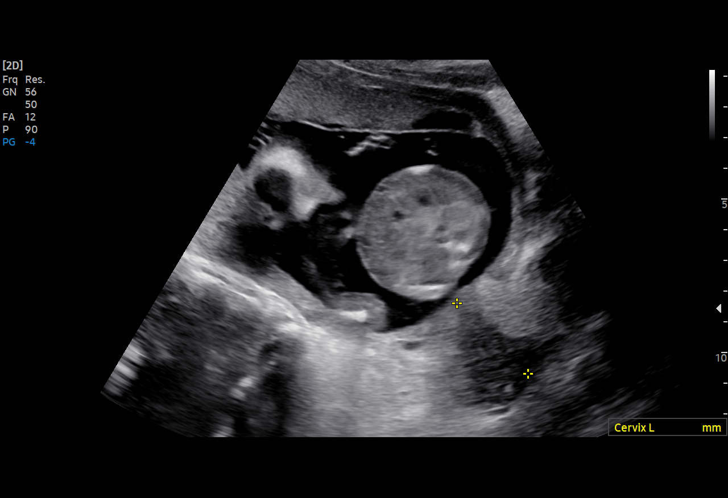
[im 7/86]
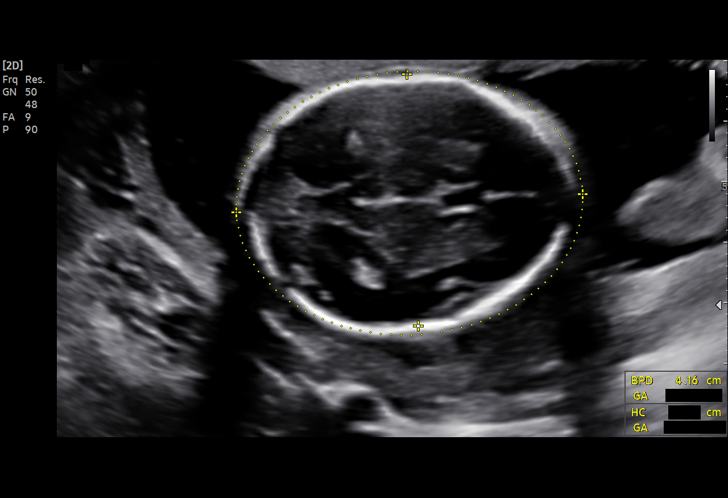
[im 14/86]
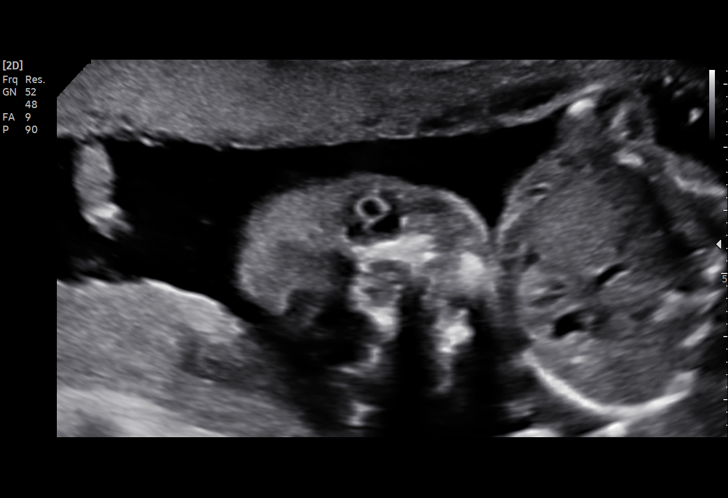
[im 20/86]
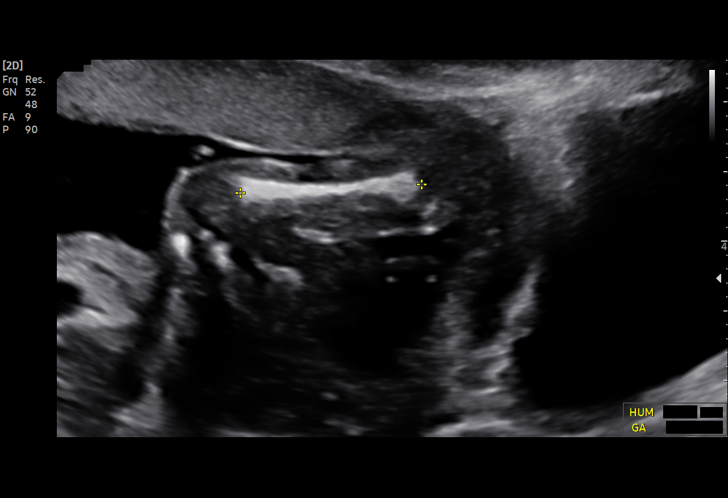
[im 27/86]
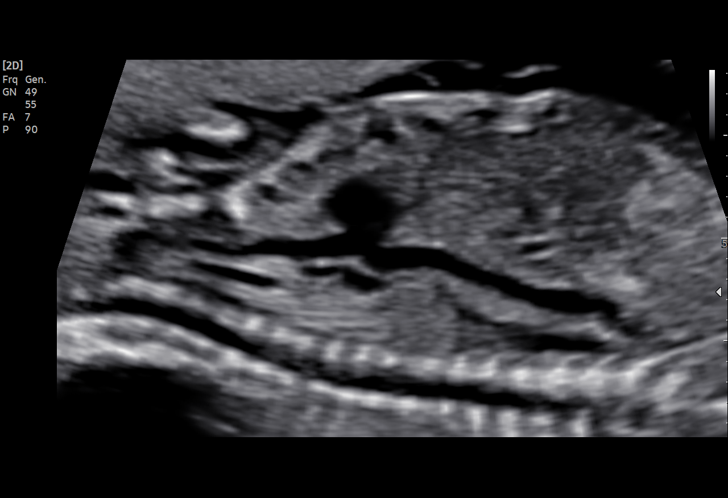
[im 33/86]
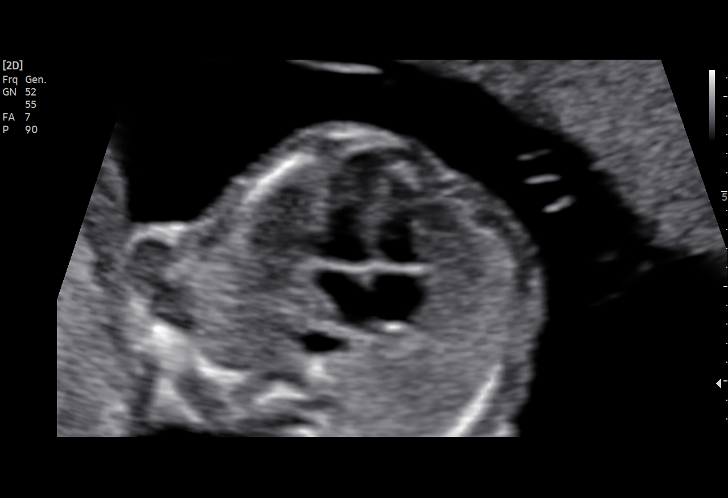
[im 40/86]
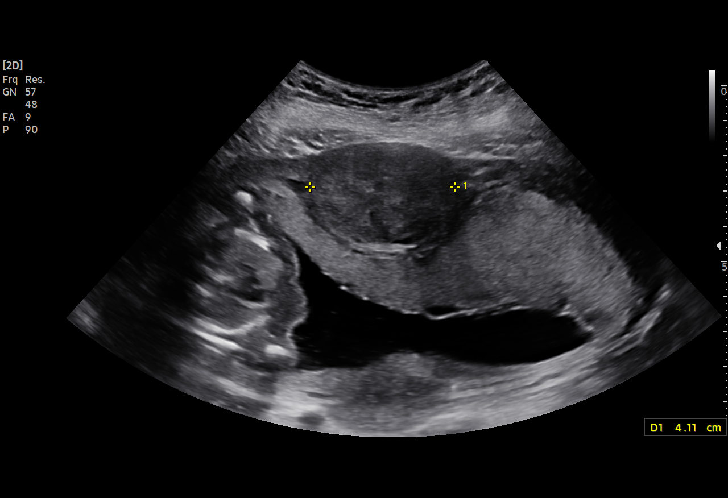
[im 46/86]
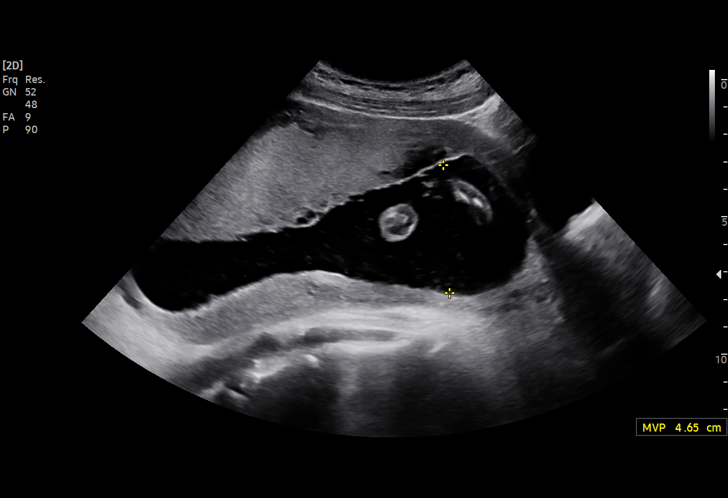
[im 50/86]
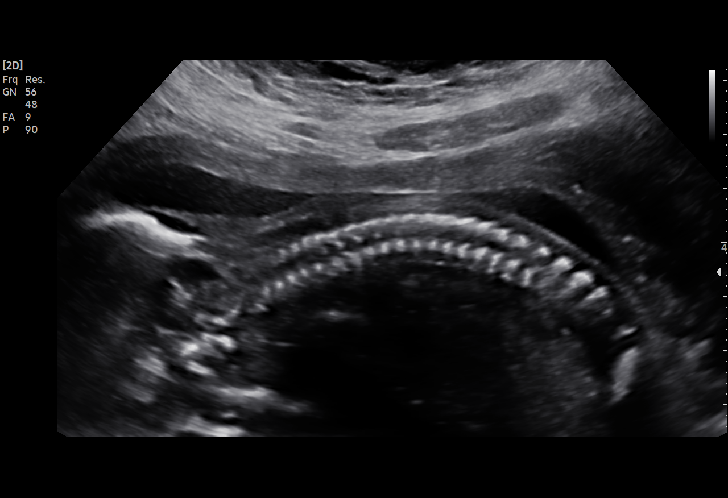
[im 56/86]
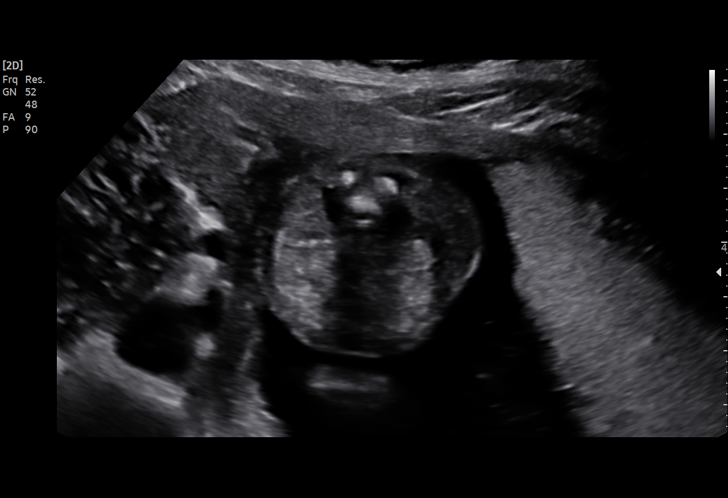
[im 63/86]
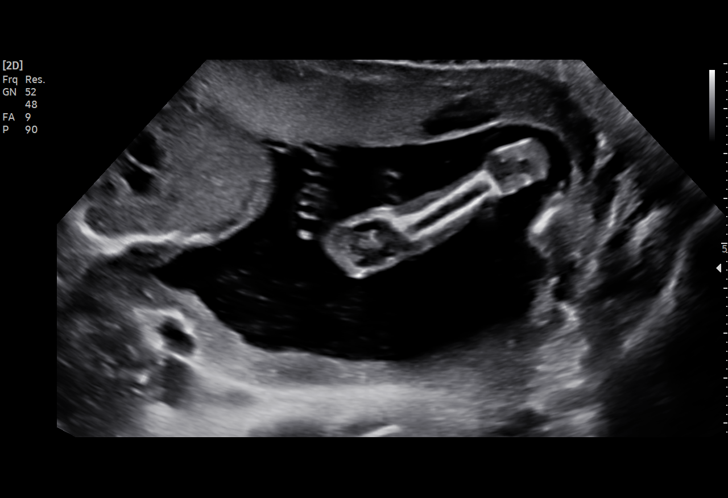
[im 69/86]
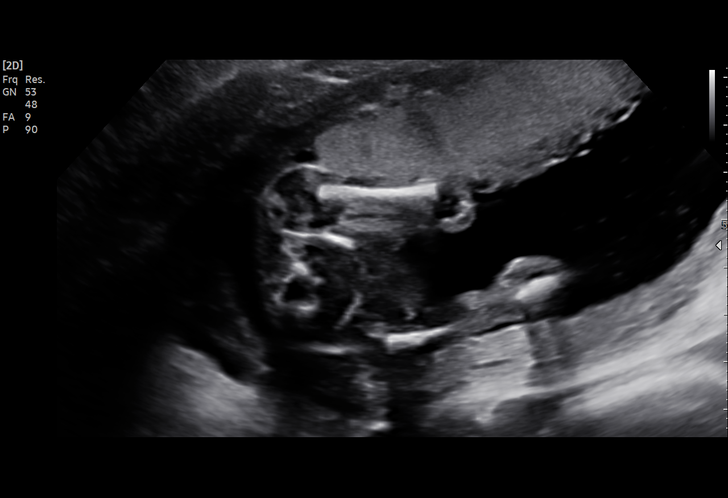
[im 76/86]
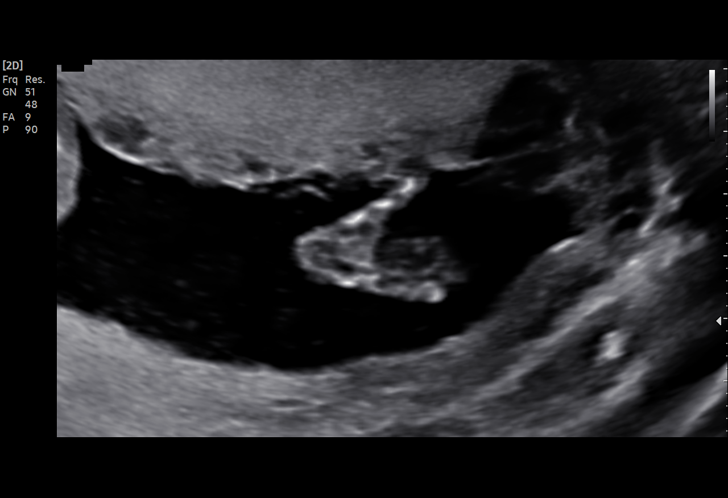
[im 82/86]
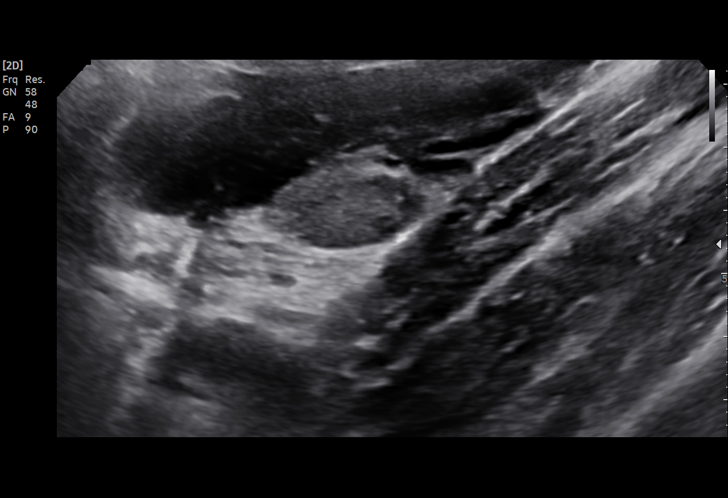

[Series 3: us ob comp + 14 wk · 1 of 1 slices shown (2 of 2)]
[im 1/1]
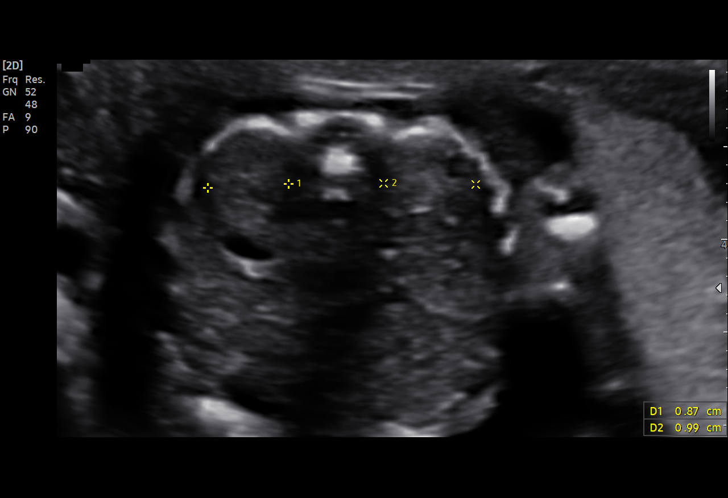

[15 of 28 positions shown; findings below may reference images not displayed]

FINDINGS: Number of Fetuses: 1

Heart Rate:  158 bpm

Movement: Yes

Presentation: Breech

Previa: No

Placental Location: Anterior

Amniotic Fluid (Subjective): Within normal limits

Amniotic Fluid (Objective):

Vertical pocket = 4.7cm

FETAL BIOMETRY

BPD: 4.2cm 18w 4 d

HC:   15.8cm 18w 5d

AC:   14.2cm 19w 4d

FL:   2.8cm 18w 3d

Current Mean GA: 18w 6d US EDC: 08/26/2021

Assigned GA:  18w 1d Assigned EDC: 08/31/2021

FETAL ANATOMY

Lateral Ventricles: Appears normal

Thalami/CSP: Appears normal

Posterior Fossa:  Appears normal

Nuchal Region: Appears normal   NFT= 3.9 mm

Upper Lip: Appears normal

Spine: Appears normal

4 Chamber Heart on Left: Appears normal

LVOT: Appears normal

RVOT: Appears normal

Stomach on Left: Appears normal

3 Vessel Cord: Appears normal

Cord Insertion site: Appears normal

Kidneys: Appears normal

Bladder: Appears normal

Extremities: Appears normal

Sex: Male

Other: No nasal bone visualized.

Maternal Findings:

Cervix:  4.0 cm TA

A retroplacental fibroid is seen in the anterior uterine wall which
measures 4.3 x 4.1 x 2.8 cm.
IMPRESSION: Assigned GA currently 18 weeks 1 day.  Appropriate fetal growth.

No fetal anomalies identified.

No nasal bone visualized. Absent nasal bone is considered a soft
sonographic marker for aneuploidy. Recommend correlation with
genetic screening test results if performed, and consider genetic
counseling.

4.3 cm retroplacental uterine fibroid.

## 2022-06-22 DIAGNOSIS — Z419 Encounter for procedure for purposes other than remedying health state, unspecified: Secondary | ICD-10-CM | POA: Diagnosis not present

## 2022-07-23 DIAGNOSIS — Z419 Encounter for procedure for purposes other than remedying health state, unspecified: Secondary | ICD-10-CM | POA: Diagnosis not present

## 2022-08-23 DIAGNOSIS — Z419 Encounter for procedure for purposes other than remedying health state, unspecified: Secondary | ICD-10-CM | POA: Diagnosis not present

## 2022-09-21 DIAGNOSIS — Z419 Encounter for procedure for purposes other than remedying health state, unspecified: Secondary | ICD-10-CM | POA: Diagnosis not present

## 2022-10-22 DIAGNOSIS — Z419 Encounter for procedure for purposes other than remedying health state, unspecified: Secondary | ICD-10-CM | POA: Diagnosis not present

## 2022-11-21 DIAGNOSIS — Z419 Encounter for procedure for purposes other than remedying health state, unspecified: Secondary | ICD-10-CM | POA: Diagnosis not present

## 2022-12-22 DIAGNOSIS — Z419 Encounter for procedure for purposes other than remedying health state, unspecified: Secondary | ICD-10-CM | POA: Diagnosis not present

## 2023-01-21 DIAGNOSIS — Z419 Encounter for procedure for purposes other than remedying health state, unspecified: Secondary | ICD-10-CM | POA: Diagnosis not present

## 2023-02-21 DIAGNOSIS — Z419 Encounter for procedure for purposes other than remedying health state, unspecified: Secondary | ICD-10-CM | POA: Diagnosis not present

## 2023-02-23 DIAGNOSIS — R062 Wheezing: Secondary | ICD-10-CM | POA: Diagnosis not present

## 2023-02-23 DIAGNOSIS — R4589 Other symptoms and signs involving emotional state: Secondary | ICD-10-CM | POA: Diagnosis not present

## 2023-02-23 DIAGNOSIS — R069 Unspecified abnormalities of breathing: Secondary | ICD-10-CM | POA: Diagnosis not present

## 2023-02-23 DIAGNOSIS — R110 Nausea: Secondary | ICD-10-CM | POA: Diagnosis not present

## 2023-02-23 DIAGNOSIS — R1111 Vomiting without nausea: Secondary | ICD-10-CM | POA: Diagnosis not present

## 2023-03-24 DIAGNOSIS — Z419 Encounter for procedure for purposes other than remedying health state, unspecified: Secondary | ICD-10-CM | POA: Diagnosis not present

## 2023-04-23 DIAGNOSIS — Z419 Encounter for procedure for purposes other than remedying health state, unspecified: Secondary | ICD-10-CM | POA: Diagnosis not present

## 2023-05-24 DIAGNOSIS — Z419 Encounter for procedure for purposes other than remedying health state, unspecified: Secondary | ICD-10-CM | POA: Diagnosis not present

## 2023-06-23 DIAGNOSIS — Z419 Encounter for procedure for purposes other than remedying health state, unspecified: Secondary | ICD-10-CM | POA: Diagnosis not present

## 2023-07-10 ENCOUNTER — Other Ambulatory Visit: Payer: Self-pay | Admitting: Family Medicine

## 2023-07-10 ENCOUNTER — Inpatient Hospital Stay (HOSPITAL_COMMUNITY): Payer: 59

## 2023-07-10 ENCOUNTER — Encounter (HOSPITAL_COMMUNITY): Payer: Self-pay | Admitting: Emergency Medicine

## 2023-07-10 ENCOUNTER — Inpatient Hospital Stay (HOSPITAL_COMMUNITY)
Admission: AD | Admit: 2023-07-10 | Discharge: 2023-07-10 | Disposition: A | Discharge status: Home / Self Care | Payer: 59 | Attending: Obstetrics & Gynecology | Admitting: Obstetrics & Gynecology

## 2023-07-10 ENCOUNTER — Ambulatory Visit (HOSPITAL_COMMUNITY)
Admission: EM | Admit: 2023-07-10 | Discharge: 2023-07-10 | Disposition: A | Discharge status: Home / Self Care | Payer: 59 | Attending: Internal Medicine | Admitting: Internal Medicine

## 2023-07-10 DIAGNOSIS — O418X10 Other specified disorders of amniotic fluid and membranes, first trimester, not applicable or unspecified: Secondary | ICD-10-CM

## 2023-07-10 DIAGNOSIS — O23591 Infection of other part of genital tract in pregnancy, first trimester: Secondary | ICD-10-CM | POA: Diagnosis not present

## 2023-07-10 DIAGNOSIS — B9689 Other specified bacterial agents as the cause of diseases classified elsewhere: Secondary | ICD-10-CM | POA: Diagnosis not present

## 2023-07-10 DIAGNOSIS — Z3A08 8 weeks gestation of pregnancy: Secondary | ICD-10-CM | POA: Diagnosis not present

## 2023-07-10 DIAGNOSIS — O23592 Infection of other part of genital tract in pregnancy, second trimester: Secondary | ICD-10-CM | POA: Diagnosis not present

## 2023-07-10 DIAGNOSIS — O26891 Other specified pregnancy related conditions, first trimester: Secondary | ICD-10-CM | POA: Insufficient documentation

## 2023-07-10 DIAGNOSIS — R109 Unspecified abdominal pain: Secondary | ICD-10-CM | POA: Diagnosis not present

## 2023-07-10 DIAGNOSIS — N760 Acute vaginitis: Secondary | ICD-10-CM

## 2023-07-10 DIAGNOSIS — Z3201 Encounter for pregnancy test, result positive: Secondary | ICD-10-CM | POA: Diagnosis not present

## 2023-07-10 DIAGNOSIS — Z3491 Encounter for supervision of normal pregnancy, unspecified, first trimester: Secondary | ICD-10-CM

## 2023-07-10 DIAGNOSIS — O208 Other hemorrhage in early pregnancy: Secondary | ICD-10-CM | POA: Diagnosis not present

## 2023-07-10 HISTORY — DX: Dermatitis, unspecified: L30.9

## 2023-07-10 HISTORY — DX: Benign neoplasm of connective and other soft tissue, unspecified: D21.9

## 2023-07-10 LAB — POCT URINALYSIS DIP (MANUAL ENTRY)
Bilirubin, UA: NEGATIVE
Blood, UA: NEGATIVE
Glucose, UA: NEGATIVE mg/dL
Leukocytes, UA: NEGATIVE
Nitrite, UA: NEGATIVE
Protein Ur, POC: 30 mg/dL — AB
Spec Grav, UA: 1.025
Urobilinogen, UA: 0.2 U/dL
pH, UA: 7

## 2023-07-10 LAB — COMPREHENSIVE METABOLIC PANEL
ALT: 15 U/L (ref 0–44)
AST: 18 U/L (ref 15–41)
Albumin: 3.4 g/dL — ABNORMAL LOW (ref 3.5–5.0)
Alkaline Phosphatase: 52 U/L (ref 38–126)
Anion gap: 9 (ref 5–15)
BUN: 6 mg/dL (ref 6–20)
CO2: 22 mmol/L (ref 22–32)
Calcium: 8.9 mg/dL (ref 8.9–10.3)
Chloride: 105 mmol/L (ref 98–111)
Creatinine, Ser: 0.71 mg/dL (ref 0.44–1.00)
GFR, Estimated: >60 mL/min (ref >60)
Glucose, Bld: 87 mg/dL (ref 70–99)
Potassium: 3.4 mmol/L — ABNORMAL LOW (ref 3.5–5.1)
Sodium: 136 mmol/L (ref 135–145)
Total Bilirubin: 0.6 mg/dL (ref <1.2)
Total Protein: 6.9 g/dL (ref 6.5–8.1)

## 2023-07-10 LAB — WET PREP, GENITAL
Sperm: NONE SEEN
Trich, Wet Prep: NONE SEEN
WBC, Wet Prep HPF POC: <10 (ref <10)
Yeast Wet Prep HPF POC: NONE SEEN

## 2023-07-10 LAB — CBC
HCT: 33.8 % — ABNORMAL LOW (ref 36.0–46.0)
Hemoglobin: 11.5 g/dL — ABNORMAL LOW (ref 12.0–15.0)
MCH: 29.7 pg (ref 26.0–34.0)
MCHC: 34 g/dL (ref 30.0–36.0)
MCV: 87.3 fL (ref 80.0–100.0)
Platelets: 331 10*3/uL (ref 150–400)
RBC: 3.87 MIL/uL (ref 3.87–5.11)
RDW: 14.7 % (ref 11.5–15.5)
WBC: 6.7 10*3/uL (ref 4.0–10.5)
nRBC: 0 % (ref 0.0–0.2)

## 2023-07-10 LAB — POCT URINE PREGNANCY: Preg Test, Ur: POSITIVE — AB

## 2023-07-10 LAB — ABO/RH: ABO/RH(D): B POS

## 2023-07-10 LAB — HCG, QUANTITATIVE, PREGNANCY: hCG, Beta Chain, Quant, S: 150693 m[IU]/mL — ABNORMAL HIGH (ref <5)

## 2023-07-10 MED ORDER — ALBUTEROL SULFATE HFA 108 (90 BASE) MCG/ACT IN AERS: 1.0000 | INHALATION_SPRAY | Freq: Four times a day (QID) | RESPIRATORY_TRACT | 0 refills | Status: DC | PRN

## 2023-07-10 MED ORDER — ONDANSETRON 4 MG PO TBDP
4.0000 mg | ORAL_TABLET | Freq: Three times a day (TID) | ORAL | 2 refills | Status: DC | PRN
Start: 2023-07-10 — End: 2023-08-13

## 2023-07-10 MED ORDER — METRONIDAZOLE 0.75 % VA GEL: 1.0000 | Freq: Every day | VAGINAL | 0 refills | Status: DC

## 2023-07-10 NOTE — Discharge Instructions (Signed)
Safe Medications in Pregnancy   Acne: Benzoyl Peroxide Salicylic Acid  Backache/Headache: Tylenol: 2 regular strength every 4 hours OR              2 Extra strength every 6 hours  Colds/Coughs/Allergies: Benadryl (alcohol free) 25 mg every 6 hours as needed Breath right strips Claritin Cepacol throat lozenges Chloraseptic throat spray Cold-Eeze- up to three times per day Cough drops, alcohol free Flonase (by prescription only) Guaifenesin Mucinex Robitussin DM (plain only, alcohol free) Saline nasal spray/drops Sudafed (pseudoephedrine) & Actifed ** use only after [redacted] weeks gestation and if you do not have high blood pressure Tylenol Vicks Vaporub Zinc lozenges Zyrtec   Constipation: Colace Ducolax suppositories Fleet enema Glycerin suppositories Metamucil Milk of magnesia Miralax Senokot Smooth move tea  Diarrhea: Kaopectate Imodium A-D  *NO pepto Bismol  Hemorrhoids: Anusol Anusol HC Preparation H Tucks  Indigestion: Tums Maalox Mylanta Zantac  Pepcid  Insomnia: Benadryl (alcohol free) 25mg  every 6 hours as needed Tylenol PM Unisom, no Gelcaps  Leg Cramps: Tums MagGel  Nausea/Vomiting:  Bonine Dramamine Emetrol Ginger extract Sea bands Meclizine  Nausea medication to take during pregnancy:  Unisom (doxylamine succinate 25 mg tablets) Take one tablet daily at bedtime. If symptoms are not adequately controlled, the dose can be increased to a maximum recommended dose of two tablets daily (1/2 tablet in the morning, 1/2 tablet mid-afternoon and one at bedtime). Vitamin B6 100mg  tablets. Take one tablet twice a day (up to 200 mg per day).  Skin Rashes: Aveeno products Benadryl cream or 25mg  every 6 hours as needed Calamine Lotion 1% cortisone cream  Yeast infection: Gyne-lotrimin 7 Monistat 7   **If taking multiple medications, please check labels to avoid duplicating the same active ingredients **take medication as directed on  the label ** Do not exceed 3000 mg of tylenol in 24 hours **Do not take medications that contain aspirin or ibuprofen

## 2023-07-10 NOTE — Discharge Instructions (Addendum)
Head to the Maternity Assessment Unit for further evaluation of your abdominal pain and discharge.

## 2023-07-10 NOTE — ED Notes (Signed)
Patient is being discharged from the Urgent Care and sent to the Emergency Department via POV . Per [place] Garrison, NP, patient is in need of higher level of care due to abdominal pain with pregnancy. Patient is aware and verbalizes understanding of plan of care.  Vitals:   07/10/23 1027  BP: 122/82  Pulse: 61  Resp: 16  Temp: 98.6 F (37 C)  SpO2: 99%

## 2023-07-10 NOTE — ED Provider Notes (Signed)
[facility]    CSN: [id number] Arrival date & time: 07/10/23  4098      History   Chief Complaint Chief Complaint  Patient presents with   Possible Pregnancy   Back Pain   Abdominal Pain    HPI Shirley Turner is a 32 y.o. female.   Patient presents to clinic for fatigue, headache, lower back pain, left-sided abdominal pain, anxiety and shortness of breath.  She is concerned that she may be pregnant.  Her last menstrual cycle was on 05/18/23.   She is not having any vaginal bleeding.  She describes an increase of a substance when she wipes that is clear, like water. No odor to this. Cramping abdominal pain.   She has recently had a headache, she has taken naproxen for this.  The history is provided by the patient and medical records.  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   Asthma 06/15/2019   Alcohol use 8 shots Henessey 04/2019 06/15/2019   Depression, major, single episode, complete remission (HCC) dx'd 2016 06/15/2019   Adjustment disorder with mixed anxiety and depressed mood 02/28/2019   Tobacco use disorder 10-12 cpd 06/16/2018   Cocaine use disorder, moderate, dependence (HCC)--last use 04/09/19 06/15/2018   Cannabis use disorder, moderate, dependence (HCC) 06/15/2018   Severe recurrent major depression without psychotic features (HCC) 06/14/2018    Past Surgical History:  Procedure Laterality Date   TONSILLECTOMY AND ADENOIDECTOMY      OB History     Gravida  4   Para  3   Term   3   Preterm  0   AB  0   Living  3      SAB  0   IAB  0   Ectopic  0   Multiple  0   Live Births  3            Home Medications    Prior to Admission medications   Medication Sig Start Date End Date Taking? Authorizing Provider  acetaminophen (TYLENOL) 325 MG tablet Take 2 tablets (650 mg total) by mouth every 4 (four) hours as needed (for pain scale < 4). 08/15/21   Allayne Stack, DO  albuterol (VENTOLIN HFA) 108 (90 Base) MCG/ACT inhaler Inhale 1-2 puffs into the lungs every 6 (six) hours as needed for wheezing or shortness of breath. 03/25/21   Cresenzo-Dishmon, Scarlette Calico, CNM  cyclobenzaprine (FLEXERIL) 10 MG tablet Take 1 tablet (10 mg total) by mouth at bedtime. 01/22/22   White, Elita Boone, NP  ibuprofen (ADVIL) 600 MG tablet Take 1 tablet (600 mg total) by mouth every 6 (six) hours as needed. 08/15/21   Allayne Stack, DO  naproxen (NAPROSYN) 250 MG tablet Take 2 tablets (500 mg total) by mouth 2 (two) times daily with a meal. 01/22/22   White, Elita Boone, NP  Prenatal Vit-Fe Fumarate-FA (PRENATAL MULTIVITAMIN) TABS tablet Take 1 tablet by mouth daily at 12 noon.    [provider]  Family History Family History  Problem Relation Age of Onset   Hypertension Mother    Depression Sister    Bipolar disorder Sister    Diabetes Maternal Grandmother    Hypertension Maternal Grandmother    Kidney disease Maternal Grandmother    Congestive Heart Failure Maternal Grandmother    Diabetes Paternal Grandmother    Hypertension Paternal Grandmother    Arthritis Paternal Grandmother    ADD / ADHD Half-Sister     Social History Social History   Tobacco Use   Smoking status: Former    Current packs/day: 0.25    Types: Cigarettes   Smokeless tobacco: Never  Vaping Use   Vaping status: Never Used  Substance Use Topics   Alcohol use: Not Currently    Alcohol/week: 8.0 standard drinks of alcohol    Types: 8 Shots of liquor per week    Comment: as of  03/25/2021 - patient drank 1 glass of wine 03/18/2021   Drug use: Yes    Types: Marijuana    Comment: states she smokes marijuana daily as of 03/25/2021     Allergies   Patient has no known allergies.   Review of Systems Review of Systems  Per HPI   Physical Exam Triage Vital Signs ED Triage Vitals [07/10/23 1027]  Encounter Vitals Group     BP 122/82     Systolic BP Percentile      Diastolic BP Percentile      Pulse Rate 61     Resp 16     Temp 98.6 F (37 C)     Temp Source Oral     SpO2 99 %     Weight      Height      Head Circumference      Peak Flow      Pain Score 10     Pain Loc      Pain Education      Exclude from Growth Chart    No data found.  Updated Vital Signs BP 122/82 (BP Location: Right Arm)   Pulse 61   Temp 98.6 F (37 C) (Oral)   Resp 16   LMP 05/18/2023 (Exact Date)   SpO2 99%   Visual Acuity Right Eye Distance:   Left Eye Distance:   Bilateral Distance:    Right Eye Near:   Left Eye Near:    Bilateral Near:     Physical Exam Vitals and nursing note reviewed.  Constitutional:      Appearance: Normal appearance. She is well-developed.  HENT:     Head: Normocephalic and atraumatic.     Right Ear: External ear normal.     Left Ear: External ear normal.     Nose: Nose normal.     Mouth/Throat:     Mouth: Mucous membranes are moist.  Eyes:     General: No scleral icterus. Cardiovascular:     Rate and Rhythm: Normal rate.  Pulmonary:     Effort: Pulmonary effort is normal. No respiratory distress.  Abdominal:     General: Abdomen is flat.     Palpations: Abdomen is soft.     Tenderness: There is abdominal tenderness in the left upper quadrant and left lower quadrant.  Skin:    General: Skin is warm and dry.  Neurological:     General: No focal deficit present.     Mental Status: She is alert and oriented to person, place, and time.  Psychiatric:  Mood and Affect: Mood is anxious.      UC Treatments / Results   Labs (all labs ordered are listed, but only abnormal results are displayed) Labs Reviewed  POCT URINE PREGNANCY - Abnormal; Notable for the following components:      Result Value   Preg Test, Ur Positive (*)    All other components within normal limits  POCT URINALYSIS DIP (MANUAL ENTRY) - Abnormal; Notable for the following components:   Ketones, POC UA moderate (40) (*)    Protein Ur, POC =30 (*)    All other components within normal limits    EKG   Radiology No results found.  Procedures Procedures (including critical care time)  Medications Ordered in UC Medications - No data to display  Initial Impression / Assessment and Plan / UC Course  I have reviewed the triage vital signs and the nursing notes.  Pertinent labs & imaging results that were available during my care of the patient were reviewed by me and considered in my medical decision making (see chart for details).  Vitals and triage reviewed, patient is hemodynamically stable.  She is anxious.  Abdomen with left-sided tenderness to palpation, patient grimacing with palpation.  Urine pregnancy test is positive.  UA does not reveal any obvious infection.  Bilateral lower back pain and nausea, associate this with pregnancy.  Patient reports she is been very anxious over the fact that she may be pregnant and does not want to go into a panic attack.  RBA with patient reviewed, discussed MAU would be the most beneficial due to the strange fluid with wiping and the abdominal pain.  Patient verbalized understanding, will head to MAU via POV.     Final Clinical Impressions(s) / UC Diagnoses   Final diagnoses:  Positive urine pregnancy test  Abdominal pain, unspecified abdominal location     Discharge Instructions      Head to the Maternity Assessment Unit for further evaluation of your abdominal pain and discharge.     ED Prescriptions   None    PDMP not reviewed this encounter.   Jenkins Risdon, [place] N,  [place] 07/10/23 1057

## 2023-07-10 NOTE — MAU Provider Note (Signed)
Chief Complaint: Vaginal Discharge and Back Pain   Provider seen 1424    SUBJECTIVE HPI: Shirley Turner is a 32 y.o. G4P3003 at [redacted]w[redacted]d by LMP who presents to maternity admissions reporting left back pain, increased vaginal discharge.  Patient notes clear discharge for past week. No itching/odor. No vaginal bleeding. Started to have mild left-sided back pain today and came in for evaluation. Just found out she was pregnant within the past few days and very anxious. Denies urinary symptoms, cramping, VB, passage of clots/tissue. Hasn't started PNV. Has OB appointment scheduled.  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   Anemia    Anxiety    Asthma    Depression    Eczema    Fibroid    Headache    Past Surgical History:  Procedure Laterality Date   TONSILLECTOMY AND ADENOIDECTOMY     Social History   Socioeconomic History   Marital status: Single    Spouse name: Not on file   Number of children: 2   Years of education: Not on file   Highest education level: Not on file  Occupational History   Not on file  Tobacco Use   Smoking status: Some Days    Current packs/day: 0.25    Types: Cigarettes   Smokeless tobacco: Never  Vaping Use   Vaping status: Never Used  Substance and Sexual Activity   Alcohol use: Not Currently    Alcohol/week: 8.0 standard drinks of alcohol    Types: 8 Shots of liquor per week   Drug use: Yes    Frequency: 4.0 times per week    Types: Marijuana   Sexual activity: Yes    Partners: Male    Birth control/protection: None  Other Topics Concern   Not on file  Social History Narrative   Not on file   Social Drivers of Health   Financial Resource Strain: Not on file  Food Insecurity: Not on file  Transportation Needs: Not on file  Physical Activity: Not on file  Stress: Not on file  Social Connections: Not on file  Intimate Partner Violence: Not At  Risk (01/03/2021)   Humiliation, Afraid, Rape, and Kick questionnaire    Fear of Current or Ex-Partner: No    Emotionally Abused: No    Physically Abused: No    Sexually Abused: No   No current facility-administered medications on file prior to encounter.   Current Outpatient Medications on File Prior to Encounter  Medication Sig Dispense Refill   acetaminophen (TYLENOL) 325 MG tablet Take 2 tablets (650 mg total) by mouth every 4 (four) hours as needed (for pain scale < 4).     albuterol (VENTOLIN HFA) 108 (90 Base) MCG/ACT inhaler Inhale 1-2 puffs into the lungs every 6 (six) hours as needed for wheezing or shortness of breath. 18 g 0   Prenatal Vit-Fe Fumarate-FA (PRENATAL MULTIVITAMIN) TABS tablet Take 1 tablet by mouth daily at 12 noon.     No Known Allergies  ROS:  Pertinent positives/negatives listed above.  I have reviewed patient's Past Medical Hx, Surgical Hx, Family Hx, Social Hx, medications and allergies.   Physical Exam  Patient Vitals for the past 24 hrs:  BP Temp Temp src Pulse Resp SpO2 Height Weight  07/10/23 1130 120/80 98.3 F (36.8 C) Oral 62 15 100 % 5\' 3"  (1.6 m) 78.7 kg   Constitutional: Well-developed, well-nourished female in no acute  distress. Visibly anxious  Cardiovascular: normal rate Respiratory: normal effort GI: Abd soft, non-tender. Pos BS x 4 MS: Extremities nontender, no edema, normal ROM Neurologic: Alert and oriented x 4.  GU: Neg CVAT.  LAB RESULTS Results for orders placed or performed during the hospital encounter of 07/10/23 (from the past 24 hours)  Wet prep, genital     Status: Abnormal   Collection Time: 07/10/23 11:48 AM   Specimen: PATH Cytology Cervicovaginal Ancillary Only  Result Value Ref Range   Yeast Wet Prep HPF POC NONE SEEN NONE SEEN   Trich, Wet Prep NONE SEEN NONE SEEN   Clue Cells Wet Prep HPF POC PRESENT (A) NONE SEEN   WBC, Wet Prep HPF POC <10 <10   Sperm NONE SEEN   ABO/Rh     Status: None   Collection Time:  07/10/23 12:46 PM  Result Value Ref Range   ABO/RH(D) B POS    No rh immune globuloin      NOT A RH IMMUNE GLOBULIN CANDIDATE, PT RH POSITIVE Performed at [facility], [address], [place], [place] [address]   CBC     Status: Abnormal   Collection Time: 07/10/23 12:49 PM  Result Value Ref Range   WBC 6.7 4.0 - 10.5 K/uL   RBC 3.87 3.87 - 5.11 MIL/uL   Hemoglobin 11.5 (L) 12.0 - 15.0 g/dL   HCT 40.9 (L) 81.1 - 91.4 %   MCV 87.3 80.0 - 100.0 fL   MCH 29.7 26.0 - 34.0 pg   MCHC 34.0 30.0 - 36.0 g/dL   RDW 78.2 95.6 - 21.3 %   Platelets 331 150 - 400 K/uL   nRBC 0.0 0.0 - 0.2 %  Comprehensive metabolic panel     Status: Abnormal   Collection Time: 07/10/23 12:49 PM  Result Value Ref Range   Sodium 136 135 - 145 mmol/L   Potassium 3.4 (L) 3.5 - 5.1 mmol/L   Chloride 105 98 - 111 mmol/L   CO2 22 22 - 32 mmol/L   Glucose, Bld 87 70 - 99 mg/dL   BUN 6 6 - 20 mg/dL   Creatinine, Ser 0.86 0.44 - 1.00 mg/dL   Calcium 8.9 8.9 - 57.8 mg/dL   Total Protein 6.9 6.5 - 8.1 g/dL   Albumin 3.4 (L) 3.5 - 5.0 g/dL   AST 18 15 - 41 U/L   ALT 15 0 - 44 U/L   Alkaline Phosphatase 52 38 - 126 U/L   Total Bilirubin 0.6 <1.2 mg/dL   GFR, Estimated >46 >96 mL/min   Anion gap 9 5 - 15  hCG, quantitative, pregnancy     Status: Abnormal   Collection Time: 07/10/23 12:49 PM  Result Value Ref Range   hCG, Beta Chain, Quant, S 150,693 (H) <5 mIU/mL    --/--/B POS (12/18 1246)  IMAGING US OB LESS THAN 14 WEEKS WITH OB TRANSVAGINAL Result Date: 07/10/2023 CLINICAL DATA:  Flank pain EXAM: OBSTETRIC <14 WK [place] AND TRANSVAGINAL OB US TECHNIQUE: Both transabdominal and transvaginal ultrasound examinations were performed for complete evaluation of the gestation as well as the maternal uterus, adnexal regions, and pelvic cul-de-sac. Transvaginal technique was performed to assess early pregnancy. COMPARISON:  None available FINDINGS: Intrauterine gestational sac: Single Yolk sac:  Visualized. Embryo:   Visualized. Cardiac Activity: Visualized. Heart Rate: 157 bpm CRL: 18 mm   8 w   2 d  [place] EDC: 02/17/2024 Subchorionic hemorrhage:  None visualized. Maternal uterus/adnexae: 3.3 cm calcified fibroid seen again within the anterior fundus. 0.9 cm noncalcified fibroid noted in the posterior uterine body. No adnexal abnormality. Ovaries are normal in appearance. 2.0 x 1.0 x 1.7 cm subchorionic hematoma is present. IMPRESSION: 1. Single live intrauterine pregnancy with gestational age of [redacted] weeks and 4 days by provided LMP and 8 weeks and 2 days by ultrasound measurements. 2. 2.0 x 1.0 x 1.7 cm subchorionic hematoma is present. Electronically Signed   By: Acquanetta Belling M.D.   On: 07/10/2023 14:39    MAU Management/MDM: Orders Placed This Encounter  Procedures   Wet prep, genital   US OB LESS THAN 14 WEEKS WITH OB TRANSVAGINAL   CBC   Comprehensive metabolic panel   hCG, quantitative, pregnancy   ABO/Rh   Discharge patient    Meds ordered this encounter  Medications   metroNIDAZOLE (METROGEL) 0.75 % vaginal gel    Sig: Place 1 Applicatorful vaginally at bedtime.    Dispense:  70 g    Refill:  0   ondansetron (ZOFRAN-ODT) 4 MG disintegrating tablet    Sig: Take 1 tablet (4 mg total) by mouth every 8 (eight) hours as needed.    Dispense:  30 tablet    Refill:  2    Patient with left-sided back pain of approximately [redacted] weeks gestation without confirmed IUP. Ectopic work-up performed. Do also need to rule-out UTI/pyelo causing pain.  Results returned reassuring for IUP. BV likely causing discharge and cramping. Discussed metrogel with patient. She is amenable. Will start PNV. Zofran for nausea. Has intake OB appointment scheduled. Patient very reassured by the normal results and notes her pain is mild at time of discharge.  ASSESSMENT 1. Bacterial vaginosis   2. Normal IUP (intrauterine pregnancy) on prenatal ultrasound, first trimester   3. [redacted] weeks gestation of pregnancy   4.  Subchorionic hematoma in first trimester, single or unspecified fetus     PLAN Discharge home with strict return precautions. Allergies as of 07/10/2023   No Known Allergies      Medication List     STOP taking these medications    cyclobenzaprine 10 MG tablet Commonly known as: FLEXERIL   ibuprofen 600 MG tablet Commonly known as: ADVIL   naproxen 250 MG tablet Commonly known as: NAPROSYN       TAKE these medications    acetaminophen 325 MG tablet Commonly known as: Tylenol Take 2 tablets (650 mg total) by mouth every 4 (four) hours as needed (for pain scale < 4).   albuterol 108 (90 Base) MCG/ACT inhaler Commonly known as: VENTOLIN HFA Inhale 1-2 puffs into the lungs every 6 (six) hours as needed for wheezing or shortness of breath.   metroNIDAZOLE 0.75 % vaginal gel Commonly known as: METROGEL Place 1 Applicatorful vaginally at bedtime.   ondansetron 4 MG disintegrating tablet Commonly known as: ZOFRAN-ODT Take 1 tablet (4 mg total) by mouth every 8 (eight) hours as needed.   prenatal multivitamin Tabs tablet Take 1 tablet by mouth daily at 12 noon.         Wylene Simmer, MD OB Fellow 07/10/2023  2:50 PM

## 2023-07-10 NOTE — ED Triage Notes (Signed)
Pt c/o fatigue, lower back pain, left abdominal pain, anxiety, sob, and cough that started around two days.  Thinks she might be pregnant.

## 2023-07-10 NOTE — MAU Note (Signed)
Shirley Turner is a 32 y.o. at [redacted]w[redacted]d here in MAU reporting: just found out preg.  Been having a feeling, but hadn't tested.  Kind of in denial.   Left side of back has been hurting for 2 days. "Clear fluids been coming out of me this past week, clear, no odor." No itching, burning or irritation.  "Breathing has been crazy, has been anxious and scared. ".  Afraid something may be wrong with the baby.  LMP: 10/26 Onset of complaint: 2 days Pain score: 10, feels like labor Vitals:   07/10/23 1130  BP: 120/80  Pulse: 62  Resp: 15  Temp: 98.3 F (36.8 C)  SpO2: 100%      Lab orders placed from triage:  swabs collected, urine was done at [facility]

## 2023-07-11 ENCOUNTER — Ambulatory Visit: Payer: 59

## 2023-07-11 LAB — GC/CHLAMYDIA PROBE AMP (~~LOC~~) NOT AT ARMC
Chlamydia: NEGATIVE
Comment: NEGATIVE
Comment: NORMAL
Neisseria Gonorrhea: NEGATIVE

## 2023-07-24 DIAGNOSIS — Z419 Encounter for procedure for purposes other than remedying health state, unspecified: Secondary | ICD-10-CM | POA: Diagnosis not present

## 2023-07-24 NOTE — L&D Delivery Note (Signed)
 Date of delivery: 02/18/2024 Estimated Date of Delivery: 02/22/24 EGA: [redacted]w[redacted]d  Hospital Course summary: Shirley Turner is a 33 y.o. [id number] @ [redacted]w[redacted]d who was admitted on 02/18/2024 in active labor.   Delivery Note At 10:14 AM a viable female was delivered via Vaginal, Spontaneous (Presentation:      ).  APGAR: 8, 9; weight  pending.   Placenta status: Spontaneous, Intact.  Cord: 3 vessels with the following complications: None.   Anesthesia: None Episiotomy: None Lacerations: 1st degree, hemostatic, not repaired Est. Blood Loss (mL):    Mom to postpartum.  Baby to Couplet care / Skin to Skin.  [name] 02/18/2024, 12:06 PM    Delivery Narrative  Shirley Turner spontaneously starting bearing down at 0944. She delivered a vigorous female infant named Shirley Turner at 5. Apgars 8,9. The head followed by shoulders which were delivered without difficulty, and the rest of the body. Baby was immediately placed skin to skin on mother's chest. No Nuchal cord was noted. Delayed cord clamping with cord clamped by CNM and cut by FOB. Cord blood was not obtained. Placenta was delivered at 1021, primarily by maternal efforts and with some slight cord traction. Placenta was intact, Shultz mechanism, 3 VC. Perineum inspected and found to have a small first degree laceration that was hemostatic and did not require repair. AMSTL with IV pitocin  per unit protocol. EBL 200. Mother and baby stable.

## 2023-07-25 ENCOUNTER — Telehealth: Payer: 59

## 2023-07-26 ENCOUNTER — Telehealth (INDEPENDENT_AMBULATORY_CARE_PROVIDER_SITE_OTHER): Payer: 59

## 2023-07-26 DIAGNOSIS — Z3480 Encounter for supervision of other normal pregnancy, unspecified trimester: Secondary | ICD-10-CM | POA: Insufficient documentation

## 2023-07-26 NOTE — Progress Notes (Signed)
 New OB Intake  I connected with  Shirley Turner on 07/26/23 at  2:15 PM EST by telephone Video Visit and verified that I am speaking with the correct person using two identifiers. Nurse is located at [facility] and pt is located at home.  I discussed the limitations, risks, security and privacy concerns of performing an evaluation and management service by telephone and the availability of in person appointments. I also discussed with the patient that there may be a patient responsible charge related to this service. The patient expressed understanding and agreed to proceed.  I explained I am completing New OB Intake today. We discussed her EDD of 02/22/2024 that is based on LMP of 05/18/2023. Pt is G4/P3. I reviewed her allergies, medications, Medical/Surgical/OB history, and appropriate screenings. There are no cats in the home in the home  Based on history, this is a/an pregnancy uncomplicated .    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   Asthma 06/15/2019   Alcohol use 8 shots Henessey 04/2019 06/15/2019   Depression, major, single episode, complete remission (HCC) dx'd 2016 06/15/2019   Adjustment disorder with mixed anxiety and depressed mood 02/28/2019   Tobacco use disorder 10-12 cpd 06/16/2018   Cocaine use disorder, moderate, dependence (HCC)--last use 04/09/19 06/15/2018   Cannabis use disorder, moderate, dependence (HCC) 06/15/2018   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    Labs Discussed genetic screening with patient. Patient wants genetic testing to be drawn at new OB visit. Discussed possible labs to be drawn at new OB appointment.  COVID Vaccine Patient has had COVID vaccine.   Social Determinants of Health Food Insecurity: denies food insecurity WIC Referral: Patient is interested in referral to [facility].  Transportation: Patient denies transportation needs. Childcare: Discussed no children allowed at ultrasound appointments.   First visit review I reviewed new OB appt with pt. I explained she will have ob bloodwork and pap smear/pelvic exam if indicated. Explained pt will be seen by Dr.Roby  at first visit; encounter routed to appropriate provider.   Shirley Turner, CMA 07/26/2023  2:52 PM

## 2023-08-12 NOTE — Patient Instructions (Signed)
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
## 2023-08-13 ENCOUNTER — Other Ambulatory Visit (HOSPITAL_COMMUNITY)
Admission: RE | Admit: 2023-08-13 | Discharge: 2023-08-13 | Disposition: A | Discharge status: Home / Self Care | Payer: 59 | Source: Ambulatory Visit | Attending: Obstetrics | Admitting: Obstetrics

## 2023-08-13 ENCOUNTER — Encounter: Payer: Self-pay | Admitting: Obstetrics

## 2023-08-13 ENCOUNTER — Ambulatory Visit (INDEPENDENT_AMBULATORY_CARE_PROVIDER_SITE_OTHER): Payer: 59 | Admitting: Obstetrics

## 2023-08-13 VITALS — BP 123/68 | HR 87 | Ht 63.0 in | Wt 163.0 lb

## 2023-08-13 DIAGNOSIS — Z3A12 12 weeks gestation of pregnancy: Secondary | ICD-10-CM

## 2023-08-13 DIAGNOSIS — F332 Major depressive disorder, recurrent severe without psychotic features: Secondary | ICD-10-CM

## 2023-08-13 DIAGNOSIS — Z113 Encounter for screening for infections with a predominantly sexual mode of transmission: Secondary | ICD-10-CM | POA: Diagnosis present

## 2023-08-13 DIAGNOSIS — Z1379 Encounter for other screening for genetic and chromosomal anomalies: Secondary | ICD-10-CM

## 2023-08-13 DIAGNOSIS — O99331 Smoking (tobacco) complicating pregnancy, first trimester: Secondary | ICD-10-CM | POA: Insufficient documentation

## 2023-08-13 DIAGNOSIS — J4520 Mild intermittent asthma, uncomplicated: Secondary | ICD-10-CM

## 2023-08-13 DIAGNOSIS — O219 Vomiting of pregnancy, unspecified: Secondary | ICD-10-CM

## 2023-08-13 DIAGNOSIS — Z3491 Encounter for supervision of normal pregnancy, unspecified, first trimester: Secondary | ICD-10-CM

## 2023-08-13 MED ORDER — ONDANSETRON 4 MG PO TBDP: 4.0000 mg | ORAL_TABLET | Freq: Three times a day (TID) | ORAL | 2 refills | Status: DC | PRN

## 2023-08-14 ENCOUNTER — Encounter: Payer: Self-pay | Admitting: Obstetrics

## 2023-08-14 DIAGNOSIS — Z2839 Other underimmunization status: Secondary | ICD-10-CM | POA: Insufficient documentation

## 2023-08-14 LAB — CBC/D/PLT+RPR+RH+ABO+RUBIGG...
Antibody Screen: NEGATIVE
Basophils Absolute: 0 10*3/uL (ref 0.0–0.2)
Basos: 0 %
EOS (ABSOLUTE): 0.1 10*3/uL (ref 0.0–0.4)
Eos: 1 %
HCV Ab: NONREACTIVE
HIV Screen 4th Generation wRfx: NONREACTIVE
Hematocrit: 36.6 % (ref 34.0–46.6)
Hemoglobin: 12.2 g/dL (ref 11.1–15.9)
Hepatitis B Surface Ag: NEGATIVE
Immature Grans (Abs): 0 10*3/uL (ref 0.0–0.1)
Immature Granulocytes: 0 %
Lymphocytes Absolute: 1 10*3/uL (ref 0.7–3.1)
Lymphs: 22 %
MCH: 29.5 pg (ref 26.6–33.0)
MCHC: 33.3 g/dL (ref 31.5–35.7)
MCV: 88 fL (ref 79–97)
Monocytes Absolute: 0.3 10*3/uL (ref 0.1–0.9)
Monocytes: 7 %
Neutrophils Absolute: 3.3 10*3/uL (ref 1.4–7.0)
Neutrophils: 70 %
Platelets: 327 10*3/uL (ref 150–450)
RBC: 4.14 x10E6/uL (ref 3.77–5.28)
RDW: 13.7 % (ref 11.7–15.4)
RPR Ser Ql: NONREACTIVE
Rh Factor: POSITIVE
Rubella Antibodies, IGG: <0.9 {index} — ABNORMAL LOW (ref >0.99)
Varicella zoster IgG: REACTIVE
WBC: 4.7 10*3/uL (ref 3.4–10.8)

## 2023-08-14 LAB — URINALYSIS, ROUTINE W REFLEX MICROSCOPIC
Bilirubin, UA: NEGATIVE
Glucose, UA: NEGATIVE
Leukocytes,UA: NEGATIVE
Nitrite, UA: NEGATIVE
RBC, UA: NEGATIVE
Specific Gravity, UA: 1.026 (ref 1.005–1.030)
Urobilinogen, Ur: 1 mg/dL (ref 0.2–1.0)
pH, UA: 7 (ref 5.0–7.5)

## 2023-08-14 LAB — CERVICOVAGINAL ANCILLARY ONLY
Chlamydia: NEGATIVE
Comment: NEGATIVE
Comment: NEGATIVE
Comment: NORMAL
Neisseria Gonorrhea: NEGATIVE
Trichomonas: NEGATIVE

## 2023-08-14 LAB — MICROSCOPIC EXAMINATION
Casts: NONE SEEN /[LPF]
WBC, UA: NONE SEEN /[HPF] (ref 0–5)

## 2023-08-14 LAB — HCV INTERPRETATION

## 2023-08-15 LAB — CULTURE, OB URINE

## 2023-08-15 LAB — URINE CULTURE, OB REFLEX

## 2023-08-16 ENCOUNTER — Encounter: Payer: Self-pay | Admitting: Obstetrics

## 2023-08-17 LAB — MATERNIT 21 PLUS CORE, BLOOD
Fetal Fraction: 22
Result (T21): NEGATIVE
Trisomy 13 (Patau syndrome): NEGATIVE
Trisomy 18 (Edwards syndrome): NEGATIVE
Trisomy 21 (Down syndrome): NEGATIVE

## 2023-08-20 ENCOUNTER — Telehealth: Payer: Self-pay

## 2023-08-20 LAB — MONITOR DRUG PROFILE 14(MW)
Amphetamine Scrn, Ur: NEGATIVE ng/mL
BARBITURATE SCREEN URINE: NEGATIVE ng/mL
BENZODIAZEPINE SCREEN, URINE: NEGATIVE ng/mL
Buprenorphine, Urine: NEGATIVE ng/mL
Cocaine (Metab) Scrn, Ur: NEGATIVE ng/mL
Creatinine(Crt), U: 243.1 mg/dL (ref 20.0–300.0)
Fentanyl, Urine: NEGATIVE pg/mL
Meperidine Screen, Urine: NEGATIVE ng/mL
Methadone Screen, Urine: NEGATIVE ng/mL
OXYCODONE+OXYMORPHONE UR QL SCN: NEGATIVE ng/mL
Opiate Scrn, Ur: NEGATIVE ng/mL
Ph of Urine: 6.8 (ref 4.5–8.9)
Phencyclidine Qn, Ur: NEGATIVE ng/mL
Propoxyphene Scrn, Ur: NEGATIVE ng/mL
SPECIFIC GRAVITY: 1.024
Tramadol Screen, Urine: NEGATIVE ng/mL

## 2023-08-20 LAB — NICOTINE SCREEN, URINE: Cotinine Ql Scrn, Ur: POSITIVE ng/mL — AB

## 2023-08-20 LAB — CANNABINOID (GC/MS), URINE
Cannabinoid: POSITIVE — AB
Carboxy THC (GC/MS): >750 ng/mL

## 2023-08-20 NOTE — Telephone Encounter (Signed)
Spoke with patient. Advised rx sent:  Disp Refills Start End   ondansetron (ZOFRAN-ODT) 4 MG disintegrating tablet 30 tablet 2 08/13/2023 --   Sig - Route: Take 1 tablet (4 mg total) by mouth every 8 (eight) hours as needed. - Oral   Sent to pharmacy as: ondansetron (ZOFRAN-ODT) 4 MG disintegrating tablet   E-Prescribing Status: Receipt confirmed by pharmacy (08/13/2023  3:11 PM EST)   [facility] [place]  Patient advised she went to the wrong pharmacy. She will pick up rx today.

## 2023-08-20 NOTE — Telephone Encounter (Signed)
TRIAGE VOICEMAIL: Patient states she was seen the other day and requested nausea medication. She doesn't think it was sent to the pharmacy. Requesting return call.

## 2023-08-24 DIAGNOSIS — Z419 Encounter for procedure for purposes other than remedying health state, unspecified: Secondary | ICD-10-CM | POA: Diagnosis not present

## 2023-09-10 ENCOUNTER — Telehealth: Payer: Self-pay | Admitting: Licensed Practical Nurse

## 2023-09-10 ENCOUNTER — Encounter: Payer: 59 | Admitting: Licensed Practical Nurse

## 2023-09-10 NOTE — Telephone Encounter (Signed)
Reached out to pt to reschedule ROB appt that was scheduled on 09/10/2023 at 2:35.  Could not leave a message bc the voicemail never beeped to start the message.

## 2023-09-11 NOTE — Telephone Encounter (Signed)
Reached out to pt (2x) to reschedule ROB appt that was scheduled on 09/10/2023 at 2:35.  Was able to reschedule pt to 09/13/23 with E. Slaughterbeck at 1:15.

## 2023-09-13 ENCOUNTER — Encounter: Payer: Self-pay | Admitting: Certified Nurse Midwife

## 2023-09-13 ENCOUNTER — Ambulatory Visit (INDEPENDENT_AMBULATORY_CARE_PROVIDER_SITE_OTHER): Payer: 59 | Admitting: Certified Nurse Midwife

## 2023-09-13 VITALS — BP 100/64 | HR 79 | Wt 172.5 lb

## 2023-09-13 DIAGNOSIS — Z1379 Encounter for other screening for genetic and chromosomal anomalies: Secondary | ICD-10-CM

## 2023-09-13 DIAGNOSIS — Z3480 Encounter for supervision of other normal pregnancy, unspecified trimester: Secondary | ICD-10-CM

## 2023-09-13 DIAGNOSIS — O99342 Other mental disorders complicating pregnancy, second trimester: Secondary | ICD-10-CM | POA: Diagnosis not present

## 2023-09-13 DIAGNOSIS — O219 Vomiting of pregnancy, unspecified: Secondary | ICD-10-CM

## 2023-09-13 DIAGNOSIS — Z3491 Encounter for supervision of normal pregnancy, unspecified, first trimester: Secondary | ICD-10-CM

## 2023-09-13 DIAGNOSIS — Z3482 Encounter for supervision of other normal pregnancy, second trimester: Secondary | ICD-10-CM

## 2023-09-13 DIAGNOSIS — Z3A16 16 weeks gestation of pregnancy: Secondary | ICD-10-CM

## 2023-09-13 DIAGNOSIS — F4323 Adjustment disorder with mixed anxiety and depressed mood: Secondary | ICD-10-CM

## 2023-09-13 DIAGNOSIS — Z3689 Encounter for other specified antenatal screening: Secondary | ICD-10-CM

## 2023-09-13 MED ORDER — ONDANSETRON 4 MG PO TBDP: 4.0000 mg | ORAL_TABLET | Freq: Three times a day (TID) | ORAL | 1 refills | Status: DC | PRN

## 2023-09-13 NOTE — Patient Instructions (Signed)
 Second Trimester of Pregnancy  The second trimester of pregnancy is from week 14 through week 27. This is months 4 through 6 of pregnancy. During the second trimester: Morning sickness is less or has stopped. You may have more energy. You may feel hungry more often. At this time, your unborn baby is growing very fast. At the end of the sixth month, the unborn baby may be up to 12 inches long and weigh about 1 pounds. You will likely start to feel the baby move between 16 and 20 weeks of pregnancy. Body changes during your second trimester Your body continues to change during this time. The changes usually go away after your baby is born. Physical changes You will gain more weight. Your belly will get bigger. You may begin to get stretch marks on your hips, belly, and breasts. Your breasts will keep growing and may hurt. You may get dark spots or blotches on your face. A dark line from your belly button to the pubic area may appear. This line is called linea nigra. Your hair may grow faster and get thicker. Health changes You may have headaches. You may have heartburn. You may pee more often. You may have swollen, bulging veins (varicose veins). You may have trouble pooping (constipation), or swollen veins in the butt that can itch or get painful (hemorrhoids). You may have back pain. This is caused by: Weight gain. Pregnancy hormones that are relaxing the joints in your pelvis. Follow these instructions at home: Medicines Talk to your health care provider if you're taking medicines. Ask if the medicines are safe to take during pregnancy. Your provider may change the medicines that you take. Do not take any medicines unless told to by your provider. Take a prenatal vitamin that has at least 600 micrograms (mcg) of folic acid. Do not use herbal medicines, illegal drugs, or medicines that are not approved by your provider. Eating and drinking While you're pregnant your body needs  extra food for your growing baby. Talk with your provider about what to eat while pregnant. Activity Most women are able to exercise during pregnancy. Exercises may need to change as your pregnancy goes on. Talk to your provider about your activities and exercise routines. Relieving pain and discomfort Wear a good, supportive bra if your breasts hurt. Rest with your legs raised if you have leg cramps or low back pain. Take warm sitz baths to soothe pain from hemorrhoids. Use hemorrhoid cream if your provider says it's okay. Do not douche. Do not use tampons or scented pads. Do not use hot tubs, steam rooms, or saunas. Safety Wear your seatbelt at all times when you're in a car. Talk to your provider if someone hits you, hurts you, or yells at you. Talk with your provider if you're feeling sad or have thoughts of hurting yourself. Lifestyle Certain things can be harmful while you're pregnant. It's best to avoid the following: Do not drink alcohol,smoke, vape, or use products with nicotine or tobacco in them. If you need help quitting, talk with your provider. Avoid cat litter boxes and soil used by cats. These things carry germs that can cause harm to your pregnancy and your baby. General instructions Keep all follow-up visits. It helps you and your unborn baby stay as healthy as possible. Write down your questions. Take them to your prenatal visits. Your provider will: Talk with you about your overall health. Give you advice or refer you to specialists who can help with different needs,  including: Prenatal education classes. Mental health and counseling. Foods and healthy eating. Ask for help if you need help with food. Where to find more information American Pregnancy Association: americanpregnancy.org [employer] of Obstetricians and Gynecologists: acog.org Office on [employer] Health: TravelLesson.ca Contact a health care provider if: You have a headache that does not go away  when you take medicine. You have any of these problems: You can't eat or drink. You throw up or feel like you may throw up. You have watery poop (diarrhea) for 2 days or more. You have pain when you pee or your pee smells bad. You have been sick for 2 days or more and are not getting better. Contact your provider right away if: You have any of these coming from your vagina: Abnormal discharge. Bad-smelling fluid. Bleeding. Your baby is moving less than usual. You have contractions, belly cramping, or have pain in your pelvis or lower back. You have symptoms of high blood pressure or preeclampsia. These include: A severe, throbbing headache that does not go away. Sudden or extreme swelling of your face, hands, legs, or feet. Vision problems: You see spots. You have blurry vision. Your eyes are sensitive to light. If you can't reach the provider, go to an urgent care or emergency room. Get help right away if: You faint, become confused, or can't think clearly. You have chest pain or trouble breathing. You have any kind of injury, such as from a fall or a car crash. These symptoms may be an emergency. Call 911 right away. Do not wait to see if the symptoms will go away. Do not drive yourself to the hospital. This information is not intended to replace advice given to you by your health care provider. Make sure you discuss any questions you have with your health care provider. Document Revised: 04/11/2023 Document Reviewed: 11/09/2022 Elsevier Patient Education  2024 [employer].

## 2023-09-13 NOTE — Progress Notes (Signed)
    Return Prenatal Note   Assessment/Plan   Plan  33 y.o. G4P3003 at [redacted]w[redacted]d presents for follow-up OB visit. Reviewed prenatal record including previous visit note.  Supervision of other normal pregnancy, antepartum Reviewed timing for anatomy US. Ordered today. AFP collected today Zofran refilled today Reviewed red flag warning signs anticipatory guidance for upcoming prenatal care.    Adjustment disorder with mixed anxiety and depressed mood PHQ: 12 GAD: 11  - never to thought of self harm Shirley Turner feels this is generally her baseline. Knows to reach out with concerns.    Orders Placed This Encounter  Procedures   US OB Comp + 14 Wk    Standing Status:   Future    Expected Date:   09/30/2023    Expiration Date:   12/10/2023    Reason for Exam (SYMPTOM  OR DIAGNOSIS REQUIRED):   Fetal Anatomic Survey    Preferred Imaging Location?:   Internal   AFP, Serum, Open Spina Bifida    Is patient insulin dependent?:   No    Weight (lbs):   173    Gestational Age (GA), weeks:   50    Date on which patient was at this GA:   09/07/2023    GA Calculation Method:   LMP    GA Date:   02/22/2024    Number of fetuses:   1   Return in about 24 days (around 10/07/2023) for anat ultrasound and ROB.   Future Appointments  Date Time Provider Department Center  10/10/2023  1:00 PM [facility] [place] 1 [facility] None  10/10/2023  2:35 PM Swanson, Adan Sis, CNM [facility] None    For next visit:  continue with routine prenatal care     Subjective   33 y.o. [id number] at [redacted]w[redacted]d presents for this follow-up prenatal visit.  Patient has no questions or concerns today Patient reports: Movement: Absent  Objective   Flow sheet Vitals: Pulse Rate: 79 BP: 100/64 Total weight gain: 4 lb 8 oz (2.041 kg)  General Appearance  No acute distress, well appearing, and well nourished Pulmonary   Normal work of breathing Neurologic   Alert and oriented to person, place, and time Psychiatric   Mood and affect within  normal limits  Oley Balm, CNM  02/21/251:41 PM

## 2023-09-13 NOTE — Progress Notes (Signed)
ROB. Requesting more Zofran at this time, continuing to take prenatals. Anatomy ultrasound ordered. AFP today. PHQ: 12 GAD: 11

## 2023-09-13 NOTE — Assessment & Plan Note (Addendum)
Reviewed timing for anatomy US. Ordered today. AFP collected today Zofran refilled today Reviewed red flag warning signs anticipatory guidance for upcoming prenatal care.

## 2023-09-13 NOTE — Assessment & Plan Note (Signed)
PHQ: 12 GAD: 42  - never to thought of self harm [place] feels this is generally her baseline. Knows to reach out with concerns.

## 2023-09-18 LAB — AFP, SERUM, OPEN SPINA BIFIDA
AFP MoM: 0.82
AFP Value: 30.6 ng/mL
Gest. Age on Collection Date: 16.9 wk
Maternal Age At EDD: 34.6 a
OSBR Risk 1 IN: 10000
Test Results:: NEGATIVE
Weight: 173 [lb_av]

## 2023-09-21 DIAGNOSIS — Z419 Encounter for procedure for purposes other than remedying health state, unspecified: Secondary | ICD-10-CM | POA: Diagnosis not present

## 2023-10-10 ENCOUNTER — Encounter: Payer: Self-pay | Admitting: Licensed Practical Nurse

## 2023-10-10 ENCOUNTER — Encounter: Payer: 59 | Admitting: Obstetrics

## 2023-10-10 ENCOUNTER — Ambulatory Visit: Admitting: Licensed Practical Nurse

## 2023-10-10 ENCOUNTER — Ambulatory Visit: Payer: 59

## 2023-10-10 VITALS — BP 119/68 | HR 96 | Wt 171.9 lb

## 2023-10-10 DIAGNOSIS — Z3A21 21 weeks gestation of pregnancy: Secondary | ICD-10-CM | POA: Diagnosis not present

## 2023-10-10 DIAGNOSIS — O26892 Other specified pregnancy related conditions, second trimester: Secondary | ICD-10-CM | POA: Insufficient documentation

## 2023-10-10 DIAGNOSIS — Z3480 Encounter for supervision of other normal pregnancy, unspecified trimester: Secondary | ICD-10-CM

## 2023-10-10 DIAGNOSIS — Z3689 Encounter for other specified antenatal screening: Secondary | ICD-10-CM

## 2023-10-10 DIAGNOSIS — M545 Low back pain, unspecified: Secondary | ICD-10-CM | POA: Diagnosis not present

## 2023-10-10 DIAGNOSIS — Z3A20 20 weeks gestation of pregnancy: Secondary | ICD-10-CM

## 2023-10-10 DIAGNOSIS — Z3482 Encounter for supervision of other normal pregnancy, second trimester: Secondary | ICD-10-CM | POA: Diagnosis not present

## 2023-10-10 MED ORDER — DDS 300 LUMBAR TRACTION BELT MISC
0 refills | Status: DC
Start: 2023-10-10 — End: 2024-02-19

## 2023-10-10 NOTE — Progress Notes (Signed)
    Return Prenatal Note   Subjective   33 y.o. [id number] at [redacted]w[redacted]d presents for this follow-up prenatal visit.  Patient Doing ok. Reports low back pain that feels like "pressure" it is worse with walking, the pain then travels to her hips. Desires script for a support brace. Works as a Lawyer in [employer].  -Mood: pt states she "is the most happy depressed person" Reports her job and children bring her joy, she prefers to be at home and not around too many people. Admits to being "snappy: this pregnancy. -Contraception: could be open to more pregnancies in the future, does not "believe in birth control".  Patient reports: Movement: Present Contractions: Not present  Objective   Flow sheet Vitals: Pulse Rate: 96 BP: 119/68 Fundal Height: 20 cm Fetal Heart Rate (bpm): 148 Total weight gain: 3 lb 14.4 oz (1.769 kg)  General Appearance  No acute distress, well appearing, and well nourished Pulmonary   Normal work of breathing Neurologic   Alert and oriented to person, place, and time Psychiatric   Mood and affect within normal limits  Assessment/Plan   Plan  33 y.o. [id number] at [redacted]w[redacted]d presents for follow-up OB visit. Reviewed prenatal record including previous visit note.  Supervision of other normal pregnancy, antepartum -Anatomy US today, preliminary report-normal  -TWG 3lbs which is appropriate   Low back pain during pregnancy in second trimester -Script for support belt given with address of medical supply company -continue with warms soaks -Try at home stretches, consider chiro or PT     No orders of the defined types were placed in this encounter.  Return in about 4 weeks (around 11/07/2023) for ROB.   Future Appointments  Date Time Provider Department Center  11/07/2023  1:15 PM Free, Lindalou Hose, CNM [facility] None    For next visit:  continue with routine prenatal care     Ellouise Newer [facility], CNM  03/20/252:19 PM

## 2023-10-10 NOTE — Assessment & Plan Note (Addendum)
-  Anatomy US today, preliminary report-normal  -TWG 3lbs which is appropriate

## 2023-10-10 NOTE — Assessment & Plan Note (Signed)
-  Script for support belt given with address of medical supply company -continue with warms soaks -Try at home stretches, consider chiro or PT

## 2023-11-02 DIAGNOSIS — Z419 Encounter for procedure for purposes other than remedying health state, unspecified: Secondary | ICD-10-CM | POA: Diagnosis not present

## 2023-11-07 ENCOUNTER — Ambulatory Visit (INDEPENDENT_AMBULATORY_CARE_PROVIDER_SITE_OTHER)

## 2023-11-07 VITALS — BP 98/60 | HR 87 | Wt 170.0 lb

## 2023-11-07 DIAGNOSIS — M545 Low back pain, unspecified: Secondary | ICD-10-CM | POA: Diagnosis not present

## 2023-11-07 DIAGNOSIS — Z3480 Encounter for supervision of other normal pregnancy, unspecified trimester: Secondary | ICD-10-CM

## 2023-11-07 DIAGNOSIS — Z131 Encounter for screening for diabetes mellitus: Secondary | ICD-10-CM

## 2023-11-07 DIAGNOSIS — O26892 Other specified pregnancy related conditions, second trimester: Secondary | ICD-10-CM | POA: Diagnosis not present

## 2023-11-07 DIAGNOSIS — Z3A24 24 weeks gestation of pregnancy: Secondary | ICD-10-CM

## 2023-11-07 DIAGNOSIS — Z113 Encounter for screening for infections with a predominantly sexual mode of transmission: Secondary | ICD-10-CM

## 2023-11-07 DIAGNOSIS — D649 Anemia, unspecified: Secondary | ICD-10-CM

## 2023-11-07 NOTE — Progress Notes (Signed)
    Return Prenatal Note   Assessment/Plan   Plan  33 y.o. G4P3003 at [redacted]w[redacted]d presents for follow-up OB visit. Reviewed prenatal record including previous visit note.  Low back pain during pregnancy in second trimester Continues to struggle with back pain, has order for belly band but has not been able to find a store that can provide this for her.  Will keep trying to find a store or may end up buying her own I needed.  Supervision of other normal pregnancy, antepartum Doing well overall, endorses feeling tired most of the time due to family demands. Encouraged to take rest periods whenever possible.  She does feel she has a good support system in place for help.  Plan for 28 weeks labs and GGT screening at next visit.   Orders Placed This Encounter  Procedures   28 Week RH+Panel   No follow-ups on file.   Future Appointments  Date Time Provider Department Center  12/05/2023 10:00 AM [facility] LAB [facility] None  12/05/2023 10:55 AM Dominic, Alva Jewels, CNM [facility] None    For next visit:  continue with routine prenatal care     Subjective   33 y.o. G4P3003 at [redacted]w[redacted]d presents for this follow-up prenatal visit.  Patient reports feeling tired most days but has no OB concerns and reports good support at home.  Patient reports: Movement: Present Contractions: Irritability  Objective   Flow sheet Vitals: Pulse Rate: 87 BP: 98/60 Fundal Height: 27 cm Fetal Heart Rate (bpm): 142 Total weight gain: 0.907 kg  General Appearance  No acute distress, well appearing, and well nourished Pulmonary   Normal work of breathing Neurologic   Alert and oriented to person, place, and time Psychiatric   Mood and affect within normal limits  Laquita Plant, Student-MidWife  04/17/252:44 PM

## 2023-11-07 NOTE — Assessment & Plan Note (Signed)
 Doing well overall, endorses feeling tired most of the time due to family demands. Encouraged to take rest periods whenever possible.  She does feel she has a good support system in place for help.  Plan for 28 weeks labs and GGT screening at next visit.

## 2023-11-07 NOTE — Assessment & Plan Note (Signed)
 Continues to struggle with back pain, has order for belly band but has not been able to find a store that can provide this for her.  Will keep trying to find a store or may end up buying her own I needed.

## 2023-11-22 ENCOUNTER — Other Ambulatory Visit: Payer: Self-pay

## 2023-11-22 DIAGNOSIS — Z3491 Encounter for supervision of normal pregnancy, unspecified, first trimester: Secondary | ICD-10-CM

## 2023-11-22 DIAGNOSIS — O219 Vomiting of pregnancy, unspecified: Secondary | ICD-10-CM

## 2023-11-22 MED ORDER — ONDANSETRON 4 MG PO TBDP: 4.0000 mg | ORAL_TABLET | Freq: Three times a day (TID) | ORAL | 1 refills | Status: DC | PRN

## 2023-11-22 MED ORDER — ALBUTEROL SULFATE HFA 108 (90 BASE) MCG/ACT IN AERS: 2.0000 | INHALATION_SPRAY | Freq: Four times a day (QID) | RESPIRATORY_TRACT | 1 refills | Status: AC | PRN

## 2023-12-02 DIAGNOSIS — Z419 Encounter for procedure for purposes other than remedying health state, unspecified: Secondary | ICD-10-CM | POA: Diagnosis not present

## 2023-12-05 ENCOUNTER — Encounter: Payer: Self-pay | Admitting: Licensed Practical Nurse

## 2023-12-05 ENCOUNTER — Other Ambulatory Visit

## 2023-12-05 ENCOUNTER — Ambulatory Visit (INDEPENDENT_AMBULATORY_CARE_PROVIDER_SITE_OTHER): Admitting: Licensed Practical Nurse

## 2023-12-05 VITALS — BP 113/72 | HR 73 | Wt 167.5 lb

## 2023-12-05 DIAGNOSIS — Z3A24 24 weeks gestation of pregnancy: Secondary | ICD-10-CM | POA: Diagnosis not present

## 2023-12-05 DIAGNOSIS — O99343 Other mental disorders complicating pregnancy, third trimester: Secondary | ICD-10-CM

## 2023-12-05 DIAGNOSIS — D649 Anemia, unspecified: Secondary | ICD-10-CM | POA: Diagnosis not present

## 2023-12-05 DIAGNOSIS — Z113 Encounter for screening for infections with a predominantly sexual mode of transmission: Secondary | ICD-10-CM | POA: Diagnosis not present

## 2023-12-05 DIAGNOSIS — Z3A28 28 weeks gestation of pregnancy: Secondary | ICD-10-CM

## 2023-12-05 DIAGNOSIS — F4323 Adjustment disorder with mixed anxiety and depressed mood: Secondary | ICD-10-CM

## 2023-12-05 DIAGNOSIS — O2610 Low weight gain in pregnancy, unspecified trimester: Secondary | ICD-10-CM | POA: Insufficient documentation

## 2023-12-05 DIAGNOSIS — O2613 Low weight gain in pregnancy, third trimester: Secondary | ICD-10-CM

## 2023-12-05 DIAGNOSIS — Z131 Encounter for screening for diabetes mellitus: Secondary | ICD-10-CM | POA: Diagnosis not present

## 2023-12-05 DIAGNOSIS — Z3480 Encounter for supervision of other normal pregnancy, unspecified trimester: Secondary | ICD-10-CM

## 2023-12-05 NOTE — Assessment & Plan Note (Signed)
-  28wks labs collected -declined TDAP

## 2023-12-05 NOTE — Progress Notes (Signed)
    Return Prenatal Note   Subjective   33 y.o. [id number] at [redacted]w[redacted]d presents for this follow-up prenatal visit.  Patient doing well over all, trying to make self improvements by going to therapy and not using MJ.  Patient reports: Movement: Present Contractions: Irritability  Objective   Flow sheet Vitals: Pulse Rate: 73 BP: 113/72 Fundal Height: 29 cm Fetal Heart Rate (bpm): 130 Total weight gain: -8 oz (-0.227 kg)  General Appearance  No acute distress, well appearing, and well nourished Pulmonary   Normal work of breathing Neurologic   Alert and oriented to person, place, and time Psychiatric   Mood and affect within normal limits  Assessment/Plan   Plan  33 y.o. [id number] at [redacted]w[redacted]d presents for follow-up OB visit. Reviewed prenatal record including previous visit note.  Adjustment disorder with mixed anxiety and depressed mood -has recently started therapy and a program to help with addiction. Mood overall has been ok, does struggle with depression.   Supervision of other normal pregnancy, antepartum -28wks labs collected -declined TDAP      Orders Placed This Encounter  Procedures   US  OB Follow Up    Standing Status:   Future    Expected Date:   12/12/2023    Expiration Date:   04/06/2024    Reason for exam::   poor weight gain in pregnancy    [facility]   Return in about 2 weeks (around 12/19/2023) for ROB.   Future Appointments  Date Time Provider Department Center  12/19/2023  9:35 AM Malori Myers, Alva Jewels, CNM [facility] None    For next visit:  continue with routine prenatal care     Berkley Breech [facility], CNM  12/05/2510:34 PM

## 2023-12-05 NOTE — Assessment & Plan Note (Signed)
-  has recently started therapy and a program to help with addiction. Mood overall has been ok, does struggle with depression.

## 2023-12-06 LAB — 28 WEEK RH+PANEL
Basophils Absolute: 0 10*3/uL (ref 0.0–0.2)
Basos: 0 %
EOS (ABSOLUTE): 0.1 10*3/uL (ref 0.0–0.4)
Eos: 1 %
Gestational Diabetes Screen: 148 mg/dL — ABNORMAL HIGH (ref 70–139)
HIV Screen 4th Generation wRfx: NONREACTIVE
Hematocrit: 30.1 % — ABNORMAL LOW (ref 34.0–46.6)
Hemoglobin: 10.3 g/dL — ABNORMAL LOW (ref 11.1–15.9)
Immature Grans (Abs): 0 10*3/uL (ref 0.0–0.1)
Immature Granulocytes: 0 %
Lymphocytes Absolute: 2.2 10*3/uL (ref 0.7–3.1)
Lymphs: 32 %
MCH: 31.6 pg (ref 26.6–33.0)
MCHC: 34.2 g/dL (ref 31.5–35.7)
MCV: 92 fL (ref 79–97)
Monocytes Absolute: 0.4 10*3/uL (ref 0.1–0.9)
Monocytes: 5 %
Neutrophils Absolute: 4.2 10*3/uL (ref 1.4–7.0)
Neutrophils: 62 %
Platelets: 312 10*3/uL (ref 150–450)
RBC: 3.26 x10E6/uL — ABNORMAL LOW (ref 3.77–5.28)
RDW: 13.2 % (ref 11.7–15.4)
RPR Ser Ql: NONREACTIVE
WBC: 6.9 10*3/uL (ref 3.4–10.8)

## 2023-12-09 ENCOUNTER — Ambulatory Visit: Payer: Self-pay

## 2023-12-09 DIAGNOSIS — O99013 Anemia complicating pregnancy, third trimester: Secondary | ICD-10-CM

## 2023-12-09 DIAGNOSIS — O99019 Anemia complicating pregnancy, unspecified trimester: Secondary | ICD-10-CM | POA: Insufficient documentation

## 2023-12-09 MED ORDER — ACCRUFER 30 MG PO CAPS: 1.0000 | ORAL_CAPSULE | Freq: Two times a day (BID) | ORAL | 6 refills | Status: AC

## 2023-12-11 ENCOUNTER — Ambulatory Visit
Admission: RE | Admit: 2023-12-11 | Discharge: 2023-12-11 | Disposition: A | Discharge status: Home / Self Care | Source: Ambulatory Visit | Attending: Licensed Practical Nurse | Admitting: Licensed Practical Nurse

## 2023-12-11 DIAGNOSIS — Z3A28 28 weeks gestation of pregnancy: Secondary | ICD-10-CM | POA: Insufficient documentation

## 2023-12-11 DIAGNOSIS — Z3480 Encounter for supervision of other normal pregnancy, unspecified trimester: Secondary | ICD-10-CM | POA: Diagnosis present

## 2023-12-11 DIAGNOSIS — Z3A29 29 weeks gestation of pregnancy: Secondary | ICD-10-CM | POA: Diagnosis not present

## 2023-12-11 DIAGNOSIS — O2613 Low weight gain in pregnancy, third trimester: Secondary | ICD-10-CM | POA: Insufficient documentation

## 2023-12-19 ENCOUNTER — Encounter: Payer: Self-pay | Admitting: Licensed Practical Nurse

## 2023-12-19 ENCOUNTER — Other Ambulatory Visit: Payer: Self-pay

## 2023-12-19 ENCOUNTER — Other Ambulatory Visit

## 2023-12-19 ENCOUNTER — Other Ambulatory Visit (HOSPITAL_COMMUNITY)
Admission: RE | Admit: 2023-12-19 | Discharge: 2023-12-19 | Disposition: A | Discharge status: Home / Self Care | Source: Ambulatory Visit | Attending: Licensed Practical Nurse | Admitting: Licensed Practical Nurse

## 2023-12-19 ENCOUNTER — Ambulatory Visit (INDEPENDENT_AMBULATORY_CARE_PROVIDER_SITE_OTHER): Admitting: Licensed Practical Nurse

## 2023-12-19 VITALS — BP 124/75 | HR 106 | Wt 171.8 lb

## 2023-12-19 DIAGNOSIS — N898 Other specified noninflammatory disorders of vagina: Secondary | ICD-10-CM | POA: Insufficient documentation

## 2023-12-19 DIAGNOSIS — Z3A30 30 weeks gestation of pregnancy: Secondary | ICD-10-CM

## 2023-12-19 DIAGNOSIS — Z3480 Encounter for supervision of other normal pregnancy, unspecified trimester: Secondary | ICD-10-CM | POA: Diagnosis not present

## 2023-12-19 DIAGNOSIS — Z3493 Encounter for supervision of normal pregnancy, unspecified, third trimester: Secondary | ICD-10-CM | POA: Diagnosis not present

## 2023-12-19 NOTE — Progress Notes (Signed)
    Return Prenatal Note   Subjective   33 y.o. [id number] at [redacted]w[redacted]d presents for this follow-up prenatal visit.  Patient Here with her younger son, Had an episode of intense abdominal pain-she got sweaty and nauseous and then eventually had a BM which was difficult to pass and hard in consistency-comfort measures for constipation reveiwed -Mood has been ok, has been to therapy, dealing with grief as 2  family members have recently passed.   Patient reports: -Has had vaginal irration for the past week, she has seen white discharge and noted a bread like odor, IC has been painful but notes penetration has always been a little uncomfortable, even pelvic exam are uncomfortable-She states whenever something comes near her introitus she "tightens up" but then is able to relax and allow the necessary object to enter.   Movement: Present Contractions: Irritability  Objective   Flow sheet Vitals: Pulse Rate: (!) 106 BP: 124/75 Fundal Height: 33 cm Fetal Heart Rate (bpm): 135 Total weight gain: 3 lb 12.8 oz (1.724 kg)  General Appearance  No acute distress, well appearing, and well nourished Pulmonary   Normal work of breathing Pelvic exam:  moderate amount of homogenous white discharge present on perineum, was able to introduce speculum into the vagina but pt report great discomfort with the opening of speculum, exam ended without visualizing cervix.  Neurologic   Alert and oriented to person, place, and time Psychiatric   Mood and affect within normal limits  Assessment/Plan   Plan  33 y.o. [id number] at [redacted]w[redacted]d presents for follow-up OB visit. Reviewed prenatal record including previous visit note.  Supervision of other normal pregnancy, antepartum -3 hour glucose test today  -FH 33cm, had US  5/21 results pending, pt reports she was told "everything was normal" -TWG 3lbs, has an appetite tends to eat fruit, chicken, burgers and fries -Aptima swab sent      No orders of the defined types  were placed in this encounter.  Return in about 2 weeks (around 01/02/2024) for ROB.   Future Appointments  Date Time Provider Department Center  01/02/2024  1:55 PM Daniqua Campoy, Alva Jewels, CNM [facility] None    For next visit:  continue with routine prenatal care     Berkley Breech [facility], CNM  12/19/2510:44 PM

## 2023-12-19 NOTE — Assessment & Plan Note (Addendum)
-  3 hour glucose test today  -FH 33cm, had US  5/21 results pending, pt reports she was told "everything was normal" -TWG 3lbs, has an appetite tends to eat fruit, chicken, burgers and fries -Aptima swab sent

## 2023-12-20 ENCOUNTER — Other Ambulatory Visit: Payer: Self-pay | Admitting: Licensed Practical Nurse

## 2023-12-20 ENCOUNTER — Ambulatory Visit: Payer: Self-pay | Admitting: Licensed Practical Nurse

## 2023-12-20 DIAGNOSIS — B9689 Other specified bacterial agents as the cause of diseases classified elsewhere: Secondary | ICD-10-CM

## 2023-12-20 LAB — CERVICOVAGINAL ANCILLARY ONLY
Bacterial Vaginitis (gardnerella): POSITIVE — AB
Candida Glabrata: NEGATIVE
Candida Vaginitis: NEGATIVE
Chlamydia: NEGATIVE
Comment: NEGATIVE
Comment: NEGATIVE
Comment: NEGATIVE
Comment: NEGATIVE
Comment: NEGATIVE
Comment: NORMAL
Neisseria Gonorrhea: NEGATIVE
Trichomonas: NEGATIVE

## 2023-12-20 LAB — GESTATIONAL GLUCOSE TOLERANCE
Glucose, Fasting: 87 mg/dL (ref 70–94)
Glucose, GTT - 1 Hour: 135 mg/dL (ref 70–179)
Glucose, GTT - 2 Hour: 150 mg/dL (ref 70–154)
Glucose, GTT - 3 Hour: 125 mg/dL (ref 70–139)

## 2023-12-20 MED ORDER — METRONIDAZOLE 0.75 % VA GEL: 1.0000 | Freq: Every day | VAGINAL | 1 refills | Status: DC

## 2023-12-20 NOTE — Progress Notes (Signed)
 Pt seen for ROB, reported vaginal irritation, swab shows BV, pt called, reviewed treatment. Shirleen prefers Metrogel . Script sent to pharmacy on file.  Anice Kerbs, CNM  [facility]  12/20/23  7:02 PM

## 2023-12-23 ENCOUNTER — Other Ambulatory Visit: Payer: Self-pay

## 2023-12-23 DIAGNOSIS — B9689 Other specified bacterial agents as the cause of diseases classified elsewhere: Secondary | ICD-10-CM

## 2023-12-23 MED ORDER — METRONIDAZOLE 500 MG PO TABS: 500.0000 mg | ORAL_TABLET | Freq: Two times a day (BID) | ORAL | 0 refills | Status: DC

## 2024-01-02 ENCOUNTER — Encounter: Payer: Self-pay | Admitting: Licensed Practical Nurse

## 2024-01-02 ENCOUNTER — Ambulatory Visit: Admitting: Licensed Practical Nurse

## 2024-01-02 VITALS — BP 102/61 | HR 97 | Wt 170.0 lb

## 2024-01-02 DIAGNOSIS — F1910 Other psychoactive substance abuse, uncomplicated: Secondary | ICD-10-CM

## 2024-01-02 DIAGNOSIS — Z3A32 32 weeks gestation of pregnancy: Secondary | ICD-10-CM | POA: Diagnosis not present

## 2024-01-02 DIAGNOSIS — Z419 Encounter for procedure for purposes other than remedying health state, unspecified: Secondary | ICD-10-CM | POA: Diagnosis not present

## 2024-01-02 DIAGNOSIS — Z3483 Encounter for supervision of other normal pregnancy, third trimester: Secondary | ICD-10-CM | POA: Diagnosis not present

## 2024-01-02 DIAGNOSIS — D509 Iron deficiency anemia, unspecified: Secondary | ICD-10-CM

## 2024-01-02 DIAGNOSIS — F4323 Adjustment disorder with mixed anxiety and depressed mood: Secondary | ICD-10-CM

## 2024-01-02 DIAGNOSIS — Z3480 Encounter for supervision of other normal pregnancy, unspecified trimester: Secondary | ICD-10-CM

## 2024-01-02 NOTE — Assessment & Plan Note (Signed)
-  Reports feeling tired all the time. Was not given ACCRUFeR  at the pharmacy when picking up her prescriptions. Gave her a month of samples today.  -Reports using marijuana 1 to 2 times a week, says this is an improvement from before. States it helps with increasing appetite. Continues taking zofran  as needed for nausea. Encouraged to cut back on marijuana.  -TWG is 4 lbs this pregnancy. Currently eating about once a day, encouraged snacking on peanut butter and apples, smoothies and Ensure.  - Describes mood to be I know I'm depressed. Sees a therapist twice a week, declines medications at this time. States she wants be clear mind and out of the fog. Overall feels as though she is coping well and denies feelings of self harm.  - When asked about how many hours of sleep nightly, she states none due to child care and work demands.  - Reviewed PTL and warning signs.  - GBS swap next visit.  - R/O in 2 weeks.

## 2024-01-02 NOTE — Progress Notes (Addendum)
    Return Prenatal Note   Subjective   33 y.o. [id number] at [redacted]w[redacted]d presents for this follow-up prenatal visit.  Patient feeling tired all the time. Patient reports: Not receiving her iron  supplements from the pharmacy when going to pickup her prescriptions. Some painful braxton hicks that subside quickly. Overall mood is depressed, continues to see a therapist twice weekly.  Movement: Present Contractions: Irritability  Objective   Flow sheet Vitals: Pulse Rate: 97 BP: 102/61 Fundal Height: 32 cm Fetal Heart Rate (bpm): 150 Total weight gain: 0.907 kg  General Appearance  No acute distress, well appearing, and well nourished Pulmonary   Normal work of breathing Neurologic   Alert and oriented to person, place, and time Psychiatric   Mood and affect within normal limits  Assessment/Plan   Plan  33 y.o. [id number] at [redacted]w[redacted]d presents for follow-up OB visit. Reviewed prenatal record including previous visit note.  Supervision of other normal pregnancy, antepartum -Reports feeling tired all the time. Was not given ACCRUFeR  at the pharmacy when picking up her prescriptions. Gave her a month of samples today.  -Reports using marijuana 1 to 2 times a week, says this is an improvement from before. States it helps with increasing appetite. Continues taking zofran  as needed for nausea. Encouraged to cut back on marijuana.  -TWG is 4 lbs this pregnancy. Currently eating about once a day, encouraged snacking on peanut butter and apples, smoothies and Ensure.  - Describes mood to be I know I'm depressed. Sees a therapist twice a week, declines medications at this time. States she wants be clear mind and out of the fog. Overall feels as though she is coping well and denies feelings of self harm.  - When asked about how many hours of sleep nightly, she states none due to child care and work demands.  - Reviewed PTL and warning signs.  - GBS swap next visit.  - R/O in 2 weeks.      No  orders of the defined types were placed in this encounter.  Return in about 2 weeks (around 01/16/2024) for ROB, 36wk labs.   Future Appointments  Date Time Provider Department Center  01/17/2024  2:55 PM Talaya Lamprecht, Alva Jewels, CNM [facility] None    For next visit:  ROB with GBS screening      Ambar Alen Amy, Student-MidWife  06/12/254:32 PM

## 2024-01-09 ENCOUNTER — Encounter: Payer: Self-pay | Admitting: Obstetrics and Gynecology

## 2024-01-09 ENCOUNTER — Other Ambulatory Visit: Payer: Self-pay

## 2024-01-09 ENCOUNTER — Other Ambulatory Visit: Payer: Self-pay | Admitting: Licensed Practical Nurse

## 2024-01-09 ENCOUNTER — Observation Stay
Admission: EM | Admit: 2024-01-09 | Discharge: 2024-01-09 | Disposition: A | Discharge status: Home / Self Care | Attending: Obstetrics and Gynecology | Admitting: Obstetrics and Gynecology

## 2024-01-09 DIAGNOSIS — O99891 Other specified diseases and conditions complicating pregnancy: Secondary | ICD-10-CM | POA: Diagnosis not present

## 2024-01-09 DIAGNOSIS — F17210 Nicotine dependence, cigarettes, uncomplicated: Secondary | ICD-10-CM | POA: Insufficient documentation

## 2024-01-09 DIAGNOSIS — Z2839 Other underimmunization status: Principal | ICD-10-CM

## 2024-01-09 DIAGNOSIS — J45909 Unspecified asthma, uncomplicated: Secondary | ICD-10-CM | POA: Insufficient documentation

## 2024-01-09 DIAGNOSIS — Z3A35 35 weeks gestation of pregnancy: Secondary | ICD-10-CM | POA: Insufficient documentation

## 2024-01-09 DIAGNOSIS — O99343 Other mental disorders complicating pregnancy, third trimester: Secondary | ICD-10-CM | POA: Insufficient documentation

## 2024-01-09 DIAGNOSIS — O99513 Diseases of the respiratory system complicating pregnancy, third trimester: Secondary | ICD-10-CM | POA: Insufficient documentation

## 2024-01-09 DIAGNOSIS — O99333 Smoking (tobacco) complicating pregnancy, third trimester: Secondary | ICD-10-CM | POA: Diagnosis not present

## 2024-01-09 DIAGNOSIS — O2613 Low weight gain in pregnancy, third trimester: Secondary | ICD-10-CM

## 2024-01-09 DIAGNOSIS — R109 Unspecified abdominal pain: Secondary | ICD-10-CM

## 2024-01-09 DIAGNOSIS — R4589 Other symptoms and signs involving emotional state: Secondary | ICD-10-CM | POA: Diagnosis not present

## 2024-01-09 DIAGNOSIS — Z3A33 33 weeks gestation of pregnancy: Secondary | ICD-10-CM | POA: Diagnosis not present

## 2024-01-09 DIAGNOSIS — F410 Panic disorder [episodic paroxysmal anxiety] without agoraphobia: Secondary | ICD-10-CM | POA: Diagnosis not present

## 2024-01-09 DIAGNOSIS — O09523 Supervision of elderly multigravida, third trimester: Secondary | ICD-10-CM | POA: Insufficient documentation

## 2024-01-09 DIAGNOSIS — D509 Iron deficiency anemia, unspecified: Secondary | ICD-10-CM

## 2024-01-09 DIAGNOSIS — R52 Pain, unspecified: Secondary | ICD-10-CM | POA: Diagnosis not present

## 2024-01-09 DIAGNOSIS — I959 Hypotension, unspecified: Secondary | ICD-10-CM | POA: Diagnosis not present

## 2024-01-09 DIAGNOSIS — O09899 Supervision of other high risk pregnancies, unspecified trimester: Principal | ICD-10-CM

## 2024-01-09 LAB — CBC WITH DIFFERENTIAL/PLATELET
Abs Immature Granulocytes: 0.03 10*3/uL (ref 0.00–0.07)
Basophils Absolute: 0 10*3/uL (ref 0.0–0.1)
Basophils Relative: 0 %
Eosinophils Absolute: 0.1 10*3/uL (ref 0.0–0.5)
Eosinophils Relative: 1 %
HCT: 26 % — ABNORMAL LOW (ref 36.0–46.0)
Hemoglobin: 8.8 g/dL — ABNORMAL LOW (ref 12.0–15.0)
Immature Granulocytes: 0 %
Lymphocytes Relative: 28 %
Lymphs Abs: 2.3 10*3/uL (ref 0.7–4.0)
MCH: 30.2 pg (ref 26.0–34.0)
MCHC: 33.8 g/dL (ref 30.0–36.0)
MCV: 89.3 fL (ref 80.0–100.0)
Monocytes Absolute: 0.6 10*3/uL (ref 0.1–1.0)
Monocytes Relative: 7 %
Neutro Abs: 5.1 10*3/uL (ref 1.7–7.7)
Neutrophils Relative %: 64 %
Platelets: 295 10*3/uL (ref 150–400)
RBC: 2.91 MIL/uL — ABNORMAL LOW (ref 3.87–5.11)
RDW: 13.8 % (ref 11.5–15.5)
WBC: 8 10*3/uL (ref 4.0–10.5)
nRBC: 0 % (ref 0.0–0.2)

## 2024-01-09 LAB — COMPREHENSIVE METABOLIC PANEL WITH GFR
ALT: 9 U/L (ref 0–44)
AST: 15 U/L (ref 15–41)
Albumin: 2.4 g/dL — ABNORMAL LOW (ref 3.5–5.0)
Alkaline Phosphatase: 118 U/L (ref 38–126)
Anion gap: 11 (ref 5–15)
BUN: 6 mg/dL (ref 6–20)
CO2: 21 mmol/L — ABNORMAL LOW (ref 22–32)
Calcium: 8.1 mg/dL — ABNORMAL LOW (ref 8.9–10.3)
Chloride: 105 mmol/L (ref 98–111)
Creatinine, Ser: 0.49 mg/dL (ref 0.44–1.00)
GFR, Estimated: >60 mL/min (ref >60)
Glucose, Bld: 79 mg/dL (ref 70–99)
Potassium: 3.1 mmol/L — ABNORMAL LOW (ref 3.5–5.1)
Sodium: 137 mmol/L (ref 135–145)
Total Bilirubin: 0.9 mg/dL (ref 0.0–1.2)
Total Protein: 6.1 g/dL — ABNORMAL LOW (ref 6.5–8.1)

## 2024-01-09 MED ORDER — POTASSIUM CHLORIDE CRYS ER 20 MEQ PO TBCR: 20.0000 meq | EXTENDED_RELEASE_TABLET | Freq: Two times a day (BID) | ORAL | 0 refills | Status: DC

## 2024-01-09 MED ORDER — HYDROXYZINE HCL 50 MG PO TABS: 50.0000 mg | ORAL_TABLET | Freq: Four times a day (QID) | ORAL | 0 refills | Status: AC | PRN

## 2024-01-09 MED ORDER — ONDANSETRON HCL 4 MG/2ML IJ SOLN: 4.0000 mg | Freq: Four times a day (QID) | INTRAMUSCULAR | 0 refills | Status: DC | PRN

## 2024-01-09 MED ORDER — HYDROXYZINE HCL 25 MG PO TABS: 50.0000 mg | ORAL_TABLET | Freq: Four times a day (QID) | ORAL | Status: DC | PRN

## 2024-01-09 MED ORDER — ONDANSETRON HCL 4 MG/2ML IJ SOLN
4.0000 mg | Freq: Four times a day (QID) | INTRAMUSCULAR | Status: DC | PRN
  Administered 2024-01-09: 4 mg via INTRAVENOUS
  Filled 2024-01-09: qty 2

## 2024-01-09 MED ORDER — LACTATED RINGERS IV BOLUS
1000.0000 mL | Freq: Once | INTRAVENOUS | Status: AC
  Administered 2024-01-09: 1000 mL via INTRAVENOUS

## 2024-01-09 MED ORDER — CALCIUM CARBONATE ANTACID 500 MG PO CHEW
2.0000 | CHEWABLE_TABLET | Freq: Once | ORAL | Status: AC
  Administered 2024-01-09: 400 mg via ORAL
  Filled 2024-01-09: qty 2

## 2024-01-09 MED ORDER — POTASSIUM CHLORIDE CRYS ER 10 MEQ PO TBCR
20.0000 meq | EXTENDED_RELEASE_TABLET | Freq: Two times a day (BID) | ORAL | Status: DC
  Administered 2024-01-09: 20 meq via ORAL
  Filled 2024-01-09: qty 2

## 2024-01-09 NOTE — Progress Notes (Signed)
 Spoke with [employer] and they are enroute to complete the EKG 12 Lead

## 2024-01-09 NOTE — Progress Notes (Signed)
 Pt seen in L and D triage, hgb noted to be 8.8. consult for outpatient IV Iron  ordered Anice Kerbs, CNM  [facility]  01/09/24  10:39 PM

## 2024-01-09 NOTE — OB Triage Note (Signed)
 Patient arrived via EMS with complaints of contractions and nausea/ vomiting and unable to keep fluid down. Patient reports a lot of anxiety recently due to a death in the family.  Denies leaking of fluid or vaginal bleeding/ reports feeling baby move but its less than usual.

## 2024-01-09 NOTE — Progress Notes (Signed)
 Patient ID: Shirley Turner, female   DOB: [birth year], 33 y.o.   MRN: [id number]  Pt brought in by EMS for panic attack, nausea, and contractions, the pt would like to be seen by Behavioral health. Pt denies SI/HI.   Dr Belvie Boyers called regarding psychiatric consult. Dr Jadapalle is currently not available, If the pt is still here in the morning She will round on the pt.   Anice Kerbs, CNM  [facility]  01/09/2024 5:50 PM
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
## 2024-01-17 ENCOUNTER — Encounter: Admitting: Licensed Practical Nurse

## 2024-01-25 ENCOUNTER — Ambulatory Visit (HOSPITAL_COMMUNITY)
Admission: EM | Admit: 2024-01-25 | Discharge: 2024-01-25 | Disposition: A | Discharge status: Home / Self Care | Attending: Nurse Practitioner | Admitting: Nurse Practitioner

## 2024-01-25 ENCOUNTER — Encounter (HOSPITAL_COMMUNITY): Payer: Self-pay

## 2024-01-25 DIAGNOSIS — J4531 Mild persistent asthma with (acute) exacerbation: Secondary | ICD-10-CM

## 2024-01-25 DIAGNOSIS — J069 Acute upper respiratory infection, unspecified: Secondary | ICD-10-CM | POA: Diagnosis not present

## 2024-01-25 DIAGNOSIS — O99013 Anemia complicating pregnancy, third trimester: Secondary | ICD-10-CM | POA: Diagnosis not present

## 2024-01-25 DIAGNOSIS — Z3493 Encounter for supervision of normal pregnancy, unspecified, third trimester: Secondary | ICD-10-CM

## 2024-01-25 DIAGNOSIS — Z3A00 Weeks of gestation of pregnancy not specified: Secondary | ICD-10-CM

## 2024-01-25 MED ORDER — AMOXICILLIN 875 MG PO TABS: 875.0000 mg | ORAL_TABLET | ORAL | 0 refills | Status: AC

## 2024-01-25 MED ORDER — ALBUTEROL SULFATE (2.5 MG/3ML) 0.083% IN NEBU
2.5000 mg | INHALATION_SOLUTION | Freq: Once | RESPIRATORY_TRACT | Status: AC
  Administered 2024-01-25: 2.5 mg via RESPIRATORY_TRACT

## 2024-01-25 MED ORDER — ALBUTEROL SULFATE (2.5 MG/3ML) 0.083% IN NEBU
INHALATION_SOLUTION | RESPIRATORY_TRACT | Status: AC
  Filled 2024-01-25: qty 3

## 2024-01-25 NOTE — Discharge Instructions (Signed)
 You were seen today for symptoms including fatigue, poor appetite, nasal congestion, sore throat, eye discharge, cough, shortness of breath, abdominal tightening, nausea, vomiting, dizziness, and headaches. You are currently 8 months pregnant with a history of asthma and anemia, and you continue to use tobacco. You were given an albuterol  breathing treatment in the clinic. Your condition is likely related to a viral upper respiratory infection triggering an asthma flare. Your smoking may also be making your breathing symptoms worse. You were prescribed amoxicillin  in case there is a bacterial component contributing to your illness.  To manage your symptoms, continue using your albuterol  inhaler as needed for shortness of breath. Rest as much as possible and stay well-hydrated. Use a humidifier and saline spray to help with congestion. You may take pregnancy-safe medications as outlined in the handout provided today. Avoid smoking and secondhand smoke, as this can worsen breathing symptoms and affect your pregnancy. If you have been prescribed iron  or are scheduled for an infusion, be sure to follow up, as your anemia may be contributing to your weakness and fatigue.  Contact your OB/GYN if your symptoms do not begin to improve over the next few days, or if you have concerns about your pregnancy. Go to the emergency room immediately if you experience worsening shortness of breath, chest pain, decreased fetal movement, contractions, vaginal bleeding, or if you are unable to keep fluids down due to vomiting.

## 2024-01-25 NOTE — ED Provider Notes (Signed)
 [facility]    CSN: [id number] Arrival date & time: 01/25/24  1433      History   Chief Complaint Chief Complaint  Patient presents with   Emesis    HPI Shirley Turner is a 33 y.o. female.   Discussed the use of AI scribe software for clinical note transcription with the patient, who gave verbal consent to proceed.   The patient is an 76-month pregnant woman with a history of asthma and active tobacco use who presents with multiple ongoing complaints, including decreased appetite, fatigue, nasal congestion, runny nose, mild sore throat, eye crusting in the mornings, productive cough, and shortness of breath requiring frequent use of her inhaler. She also reports abdominal tightening, extreme nausea with vomiting, neck pain, dizziness, headaches, generalized weakness, and increased anxiety.  She was recently hospitalized on June 19th for observation due to a panic attack, poor oral intake, and nausea, during which she received IV fluids. An EKG at that time was noted to be abnormal but reviewed by cardiology with no concern given the absence of cardiac symptoms. A nonstress test during that admission was unremarkable. An outpatient iron  infusion was ordered for her known anemia, which she has not yet started.  She continues to smoke cigarettes and marijuana during pregnancy. Her father and two children have been recently ill with upper respiratory symptoms. Despite her complaints, she currently denies fever, active runny nose, cough, sneezing, sore throat, congestion, shortness of breath, abdominal pain, contractions, or vaginal discharge. She is able to feel normal fetal movement at this time.  The following portions of the patient's history were reviewed and updated as appropriate: allergies, current medications, past family history, past medical history, past social history, past surgical history, and problem list.    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   Vaginal delivery 08/13/2021    Patient Active Problem List   Diagnosis Date Noted   Indication for care in labor and delivery, antepartum 01/09/2024   Anemia affecting pregnancy 12/09/2023   Poor weight gain of pregnancy 12/05/2023   Low back pain during pregnancy in second trimester 10/10/2023   Rubella non-immune status, antepartum 08/14/2023   Maternal tobacco use in first trimester 08/13/2023   Marijuana use during pregnancy 07/03/2021   Supervision of other normal pregnancy, antepartum 02/11/2021   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 Authorizing Provider  amoxicillin  (AMOXIL ) 875 MG tablet Take 1 tablet (875 mg total) by mouth 2 (two) times daily at 8 am and 6 pm for 7 days. 01/25/24 02/01/24 Yes Iola Lukes, FNP  albuterol  (VENTOLIN  HFA) 108 (90 Base) MCG/ACT inhaler Inhale 2 puffs into the lungs every 6 (six) hours as needed for wheezing or shortness of breath. 11/22/23   Free, Lauraine PARAS, CNM  Ferric Maltol  (ACCRUFER ) 30 MG CAPS Take 1 capsule (30 mg total) by mouth in the morning and at bedtime. Patient not taking: Reported on 01/02/2024  12/09/23   Free, Lauraine PARAS, CNM  hydrOXYzine  (ATARAX ) 50 MG tablet Take 1 tablet (50 mg total) by mouth every 6 (six) hours as needed for anxiety. 01/09/24   Dominic, Jinnie Jansky, CNM  Misc. Devices (DDS BELT/AMBULATORY SPINAL-AIR) MISC Pregnancy lumbar support belt/band 10/10/23   Dominic, Jinnie Jansky, CNM  ondansetron  (ZOFRAN ) 4 MG/2ML SOLN injection Inject 2 mLs (4 mg total) into the vein every 6 (six) hours as needed for nausea or vomiting. 01/09/24   Dominic, Jinnie Jansky, CNM  ondansetron  (ZOFRAN -ODT) 4 MG disintegrating tablet Take 1 tablet (4 mg total) by mouth every 8 (eight) hours as needed. 11/22/23   Free, Lauraine PARAS, CNM  potassium chloride  (KLOR-CON  M) 20 MEQ tablet Take 1 tablet (20 mEq total) by mouth 2 (two) times daily for 5 days. 01/09/24 01/14/24  DominicJinnie Jansky, CNM  Prenatal Vit-Fe Fumarate-FA (PRENATAL MULTIVITAMIN) TABS tablet Take 1 tablet by mouth daily at 12 noon. Patient not taking: Reported on 01/09/2024    [provider]    Family History Family History  Problem Relation Age of Onset   Hypertension Mother    Healthy Father    Depression Sister    Bipolar disorder Sister    Schizophrenia Sister 61   Diabetes Maternal Grandmother    Hypertension Maternal Grandmother    Kidney disease Maternal Grandmother    Congestive Heart Failure Maternal Grandmother    Diabetes Paternal Grandmother    Hypertension Paternal Grandmother    Arthritis Paternal Grandmother    ADD / ADHD Half-Sister     Social History Social History   Tobacco Use   Smoking status: Every Day    Current packs/day: 0.25    Types: Cigarettes   Smokeless tobacco: Never  Vaping Use   Vaping status: Never Used  Substance Use Topics   Alcohol use: Not Currently    Alcohol/week: 8.0 standard drinks of alcohol    Types: 8 Shots of liquor per week   Drug use: Yes    Frequency: 4.0 times per week    Types: Marijuana    Comment: recently stopped     Allergies   Patient has no known  allergies.   Review of Systems Review of Systems  Constitutional:  Positive for appetite change and fatigue.  HENT:  Positive for congestion, rhinorrhea and sore throat (little).   Eyes:  Positive for discharge (crusting in eyes).  Respiratory:  Positive for cough and shortness of breath.   Gastrointestinal:  Positive for abdominal pain, nausea and vomiting.  Genitourinary:        No fluid from vagina   Musculoskeletal:  Positive for neck pain.  Neurological:  Positive for dizziness, weakness (generalized) and headaches.  Psychiatric/Behavioral:  The patient is nervous/anxious.   All other systems reviewed and are negative.    Physical Exam Triage Vital Signs ED Triage Vitals  Encounter Vitals Group     BP 01/25/24  1501 105/62     Girls Systolic BP Percentile --      Girls Diastolic BP Percentile --      Boys Systolic BP Percentile --      Boys Diastolic BP Percentile --      Pulse Rate 01/25/24 1501 87     Resp 01/25/24 1501 16     Temp 01/25/24 1501 97.9 F (36.6 C)     Temp Source 01/25/24 1501 Oral     SpO2 01/25/24 1501 96 %     Weight --      Height --      Head Circumference --      Peak Flow --      Pain Score 01/25/24 1506 0     Pain Loc --      Pain Education --      Exclude from Growth Chart --    No data found.  Updated Vital Signs BP 105/62 (BP Location: Left Arm)   Pulse 84   Temp 97.9 F (36.6 C) (Oral)   Resp 20   LMP 05/18/2023 (Exact Date)   SpO2 99%   Visual Acuity Right Eye Distance:   Left Eye Distance:   Bilateral Distance:    Right Eye Near:   Left Eye Near:    Bilateral Near:     Physical Exam Vitals reviewed.  Constitutional:      General: She is awake. She is not in acute distress.    Appearance: Normal appearance. She is well-developed. She is not ill-appearing, toxic-appearing or diaphoretic.  HENT:     Head: Normocephalic.     Right Ear: Hearing normal.     Left Ear: Hearing normal.     Nose: Nose normal.      Mouth/Throat:     Mouth: Mucous membranes are moist.  Eyes:     General: Vision grossly intact.     Conjunctiva/sclera: Conjunctivae normal.  Cardiovascular:     Rate and Rhythm: Normal rate and regular rhythm.     Heart sounds: Normal heart sounds.  Pulmonary:     Effort: Pulmonary effort is normal. No tachypnea or respiratory distress.     Breath sounds: Wheezing present.  Abdominal:     Comments: Gravid   Musculoskeletal:        General: Normal range of motion.     Cervical back: Full passive range of motion without pain, normal range of motion and neck supple.     Right lower leg: No edema.     Left lower leg: No edema.  Lymphadenopathy:     Cervical: No cervical adenopathy.  Skin:    General: Skin is warm and dry.  Neurological:     General: No focal deficit present.     Mental Status: She is alert and oriented to person, place, and time.  Psychiatric:        Mood and Affect: Mood is anxious.        Speech: Speech normal.        Behavior: Behavior normal. Behavior is cooperative.        Thought Content: Thought content normal.      UC Treatments / Results  Labs (all labs ordered are listed, but only abnormal results are displayed) Labs Reviewed - No data to display  EKG   Radiology No results found.  Procedures Procedures (including critical care time)  Medications Ordered in UC Medications  albuterol  (PROVENTIL ) (2.5 MG/3ML) 0.083% nebulizer solution 2.5 mg (2.5 mg Nebulization Given  01/25/24 1542)    Initial Impression / Assessment and Plan / UC Course  I have reviewed the triage vital signs and the nursing notes.  Pertinent labs & imaging results that were available during my care of the patient were reviewed by me and considered in my medical decision making (see chart for details).    55-month pregnant female with a history of asthma and active tobacco use presents with multiple complaints including fatigue, decreased appetite, nasal congestion, sore  throat, eye crusting, productive cough, and shortness of breath requiring frequent inhaler use. She also reports abdominal tightening, nausea, vomiting, dizziness, headaches, neck pain, generalized weakness, and anxiety. She is afebrile, nontoxic, and in no acute distress. Wheezing was noted diffusely on exam, but respirations were even and unlabored. She improved after an in-clinic albuterol  nebulizer treatment. Given her exposure to sick contacts and current symptoms, she is likely experiencing an asthma exacerbation triggered by a viral upper respiratory infection, further complicated by ongoing tobacco use. Her history of anemia may also be contributing to her fatigue and dizziness. Fetal movement is reported as normal with no signs of contractions or fluid leakage. Amoxicillin  was prescribed for possible secondary infection, and supportive care measures were advised. A handout with pregnancy-safe medications was provided, and she was instructed to continue using her albuterol  inhaler as needed. Smoking cessation was strongly encouraged. Follow-up with OB/GYN is recommended. ED precautions were thoroughly reviewed.  Final Clinical Impressions(s) / UC Diagnoses   Final diagnoses:  Upper respiratory tract infection, unspecified type  Mild persistent asthma with acute exacerbation  Anemia in pregnancy, third trimester  Third trimester pregnancy     Discharge Instructions      You were seen today for symptoms including fatigue, poor appetite, nasal congestion, sore throat, eye discharge, cough, shortness of breath, abdominal tightening, nausea, vomiting, dizziness, and headaches. You are currently 8 months pregnant with a history of asthma and anemia, and you continue to use tobacco. You were given an albuterol  breathing treatment in the clinic. Your condition is likely related to a viral upper respiratory infection triggering an asthma flare. Your smoking may also be making your breathing symptoms  worse. You were prescribed amoxicillin  in case there is a bacterial component contributing to your illness.  To manage your symptoms, continue using your albuterol  inhaler as needed for shortness of breath. Rest as much as possible and stay well-hydrated. Use a humidifier and saline spray to help with congestion. You may take pregnancy-safe medications as outlined in the handout provided today. Avoid smoking and secondhand smoke, as this can worsen breathing symptoms and affect your pregnancy. If you have been prescribed iron  or are scheduled for an infusion, be sure to follow up, as your anemia may be contributing to your weakness and fatigue.  Contact your OB/GYN if your symptoms do not begin to improve over the next few days, or if you have concerns about your pregnancy. Go to the emergency room immediately if you experience worsening shortness of breath, chest pain, decreased fetal movement, contractions, vaginal bleeding, or if you are unable to keep fluids down due to vomiting.      ED Prescriptions     Medication Sig Dispense Auth. Provider   amoxicillin  (AMOXIL ) 875 MG tablet Take 1 tablet (875 mg total) by mouth 2 (two) times daily at 8 am and 6 pm for 7 days. 14 tablet Iola Lukes, FNP      PDMP not reviewed this encounter.   Iola Lukes, [place] 01/25/24 [phone]

## 2024-01-25 NOTE — ED Triage Notes (Signed)
 Patient here today with c/o nausea, vomiting, fatigue, weakness, neck pain, and headache X 4-5 days. Patient is 8 months pregnant. Her father was sick with similar symptoms and diagnosed with bronchitis. Her children have also been sick.

## 2024-02-01 DIAGNOSIS — Z419 Encounter for procedure for purposes other than remedying health state, unspecified: Secondary | ICD-10-CM | POA: Diagnosis not present

## 2024-02-04 ENCOUNTER — Telehealth: Payer: Self-pay

## 2024-02-04 NOTE — Telephone Encounter (Signed)
 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 Stating she will be out for 6 weeks starting the end of July and going back to work around September 15.

## 2024-02-10 ENCOUNTER — Ambulatory Visit: Admitting: Obstetrics

## 2024-02-10 ENCOUNTER — Encounter: Payer: Self-pay | Admitting: Obstetrics

## 2024-02-10 VITALS — BP 107/72 | HR 85 | Wt 165.0 lb

## 2024-02-10 DIAGNOSIS — Z3A38 38 weeks gestation of pregnancy: Secondary | ICD-10-CM | POA: Diagnosis not present

## 2024-02-10 DIAGNOSIS — D649 Anemia, unspecified: Secondary | ICD-10-CM

## 2024-02-10 DIAGNOSIS — O99013 Anemia complicating pregnancy, third trimester: Secondary | ICD-10-CM

## 2024-02-10 DIAGNOSIS — Z3480 Encounter for supervision of other normal pregnancy, unspecified trimester: Secondary | ICD-10-CM

## 2024-02-10 NOTE — Assessment & Plan Note (Signed)
-   Edinburgh score 21. Discussed seeing a therapist, she has an appointment on Tuesday in person. Discussed starting medication such as Zoloft, she is opposed to taking medications in pregnancy, even after discussion of safety, will be open to discussion of starting medications after delivery.  - Reviewed labor warning signs. Instructed to call office or come to hospital with persistent headache, vision changes, regular contractions, leaking of fluid, decreased fetal movement or vaginal bleeding.

## 2024-02-10 NOTE — Assessment & Plan Note (Addendum)
-   Missed iron  infusion. Called infusion center and left VM to call patient to reschedule iron  infusion. - Discussed the importance of receiving iron  infusion before delivery, discussed her severe fatigue is most likely related and that she may feel better after receiving iron . She reports only taking her iron  every couple of days, encouraged to try and take every day.  - Reviewed current intake and encouraged increasing daily intake.

## 2024-02-10 NOTE — Progress Notes (Signed)
    Return Prenatal Note   Assessment/Plan   Plan  33 y.o. G4P3003 at [redacted]w[redacted]d presents for follow-up OB visit. Reviewed prenatal record including previous visit note.  Anemia affecting pregnancy - Missed iron  infusion. Called infusion center and left VM to call patient to reschedule iron  infusion. - Discussed the importance of receiving iron  infusion before delivery, discussed her severe fatigue is most likely related and that she may feel better after receiving iron . She reports only taking her iron  every couple of days, encouraged to try and take every day.  - Reviewed current intake and encouraged increasing daily intake.  Supervision of other normal pregnancy, antepartum - Edinburgh score 21. Discussed seeing a therapist, she has an appointment on Tuesday in person. Discussed starting medication such as Zoloft, she is opposed to taking medications in pregnancy, even after discussion of safety, will be open to discussion of starting medications after delivery.  - Reviewed labor warning signs. Instructed to call office or come to hospital with persistent headache, vision changes, regular contractions, leaking of fluid, decreased fetal movement or vaginal bleeding.      No orders of the defined types were placed in this encounter.  Return in about 1 week (around 02/17/2024).   Future Appointments  Date Time Provider Department Center  02/17/2024 10:35 AM Shirley Turner, CNM [facility] None    For next visit:  Routine prenatal care    Subjective  Shirley Turner is feeling tired, depressed, and anxious. She reports she only eats one meal a day and its mostly [employer]. She also reports eating cornstarch throughout the day, though she does try to limit her intake. She reports being overwhelmed with her current circumstances, and has no help.   Movement: Present Contractions: Irritability  Objective   Flow sheet Vitals: Pulse Rate: 85 BP: 107/72 Fundal Height: 37 cm Fetal Heart Rate  (bpm): 122 Total weight gain: -1.361 kg  General Appearance  No acute distress, well appearing, and well nourished Pulmonary   Normal work of breathing Neurologic   Alert and oriented to person, place, and time Psychiatric   Mood and affect within normal limits  Shirley Turner, [facility] 02/10/24 10:56 AM

## 2024-02-17 ENCOUNTER — Ambulatory Visit: Admission: RE | Admit: 2024-02-17 | Source: Ambulatory Visit

## 2024-02-17 ENCOUNTER — Ambulatory Visit (INDEPENDENT_AMBULATORY_CARE_PROVIDER_SITE_OTHER): Admitting: Certified Nurse Midwife

## 2024-02-17 ENCOUNTER — Other Ambulatory Visit (HOSPITAL_COMMUNITY)
Admission: RE | Admit: 2024-02-17 | Discharge: 2024-02-17 | Disposition: A | Discharge status: Home / Self Care | Source: Ambulatory Visit | Attending: Certified Nurse Midwife | Admitting: Certified Nurse Midwife

## 2024-02-17 VITALS — BP 99/66 | HR 97 | Wt 165.4 lb

## 2024-02-17 DIAGNOSIS — Z113 Encounter for screening for infections with a predominantly sexual mode of transmission: Secondary | ICD-10-CM

## 2024-02-17 DIAGNOSIS — Z3480 Encounter for supervision of other normal pregnancy, unspecified trimester: Secondary | ICD-10-CM

## 2024-02-17 DIAGNOSIS — Z3685 Encounter for antenatal screening for Streptococcus B: Secondary | ICD-10-CM | POA: Diagnosis not present

## 2024-02-17 DIAGNOSIS — Z3A39 39 weeks gestation of pregnancy: Secondary | ICD-10-CM | POA: Insufficient documentation

## 2024-02-17 NOTE — Progress Notes (Signed)
 ROB doing well, feeling good movement. She is having some irregular contractions and notes she has lost her mucus plug. Reviewed labor precautions. GBS and cultures collected today. SVE per pt request 1.5/50/-2 . Discussed NST next visit due to being at her due date. She is in agreement.   Follow up 1 wk.   Zelda Hummer, CNM

## 2024-02-17 NOTE — Addendum Note (Signed)
 Addended by: MILLICENT MATHIS CROME on: 02/17/2024 11:09 AM   Modules accepted: Orders

## 2024-02-18 ENCOUNTER — Inpatient Hospital Stay
Admission: EM | Admit: 2024-02-18 | Discharge: 2024-02-19 | DRG: 807 | Disposition: A | Discharge status: Home / Self Care | Attending: Obstetrics and Gynecology | Admitting: Family

## 2024-02-18 ENCOUNTER — Encounter: Payer: Self-pay | Admitting: Obstetrics and Gynecology

## 2024-02-18 ENCOUNTER — Inpatient Hospital Stay: Admitting: Anesthesiology

## 2024-02-18 ENCOUNTER — Other Ambulatory Visit: Payer: Self-pay

## 2024-02-18 DIAGNOSIS — O9902 Anemia complicating childbirth: Secondary | ICD-10-CM | POA: Diagnosis not present

## 2024-02-18 DIAGNOSIS — O479 False labor, unspecified: Secondary | ICD-10-CM | POA: Diagnosis present

## 2024-02-18 DIAGNOSIS — D649 Anemia, unspecified: Secondary | ICD-10-CM | POA: Diagnosis not present

## 2024-02-18 DIAGNOSIS — Z2839 Other underimmunization status: Principal | ICD-10-CM

## 2024-02-18 DIAGNOSIS — Z3A39 39 weeks gestation of pregnancy: Secondary | ICD-10-CM | POA: Diagnosis not present

## 2024-02-18 DIAGNOSIS — O26893 Other specified pregnancy related conditions, third trimester: Secondary | ICD-10-CM | POA: Diagnosis present

## 2024-02-18 DIAGNOSIS — O2613 Low weight gain in pregnancy, third trimester: Secondary | ICD-10-CM

## 2024-02-18 LAB — URINE DRUG SCREEN, QUALITATIVE (ARMC ONLY)
Amphetamines, Ur Screen: NOT DETECTED
Barbiturates, Ur Screen: NOT DETECTED
Benzodiazepine, Ur Scrn: NOT DETECTED
Cannabinoid 50 Ng, Ur ~~LOC~~: POSITIVE — AB
Cocaine Metabolite,Ur ~~LOC~~: NOT DETECTED
MDMA (Ecstasy)Ur Screen: NOT DETECTED
Methadone Scn, Ur: NOT DETECTED
Opiate, Ur Screen: NOT DETECTED
Phencyclidine (PCP) Ur S: NOT DETECTED
Tricyclic, Ur Screen: NOT DETECTED

## 2024-02-18 LAB — TYPE AND SCREEN
ABO/RH(D): B POS
Antibody Screen: NEGATIVE

## 2024-02-18 LAB — CBC
HCT: 32.2 % — ABNORMAL LOW (ref 36.0–46.0)
Hemoglobin: 10.8 g/dL — ABNORMAL LOW (ref 12.0–15.0)
MCH: 29.8 pg (ref 26.0–34.0)
MCHC: 33.5 g/dL (ref 30.0–36.0)
MCV: 88.7 fL (ref 80.0–100.0)
Platelets: 328 K/uL (ref 150–400)
RBC: 3.63 MIL/uL — ABNORMAL LOW (ref 3.87–5.11)
RDW: 13.6 % (ref 11.5–15.5)
WBC: 8.8 K/uL (ref 4.0–10.5)
nRBC: 0 % (ref 0.0–0.2)

## 2024-02-18 LAB — CERVICOVAGINAL ANCILLARY ONLY
Chlamydia: NEGATIVE
Comment: NEGATIVE
Comment: NORMAL
Neisseria Gonorrhea: NEGATIVE

## 2024-02-18 LAB — GROUP B STREP BY PCR: Group B strep by PCR: NEGATIVE

## 2024-02-18 MED ORDER — TETANUS-DIPHTH-ACELL PERTUSSIS 5-2.5-18.5 LF-MCG/0.5 IM SUSY: 0.5000 mL | PREFILLED_SYRINGE | Freq: Once | INTRAMUSCULAR | Status: DC

## 2024-02-18 MED ORDER — ONDANSETRON HCL 4 MG/2ML IJ SOLN: 4.0000 mg | Freq: Four times a day (QID) | INTRAMUSCULAR | Status: DC | PRN

## 2024-02-18 MED ORDER — DIPHENHYDRAMINE HCL 50 MG/ML IJ SOLN: 12.5000 mg | INTRAMUSCULAR | Status: DC | PRN

## 2024-02-18 MED ORDER — SOD CITRATE-CITRIC ACID 500-334 MG/5ML PO SOLN
30.0000 mL | ORAL | Status: DC | PRN
Start: 2024-02-18 — End: 2024-02-18

## 2024-02-18 MED ORDER — LIDOCAINE HCL (PF) 1 % IJ SOLN
INTRAMUSCULAR | Status: AC
  Filled 2024-02-18: qty 30

## 2024-02-18 MED ORDER — LACTATED RINGERS IV SOLN: 500.0000 mL | Freq: Once | INTRAVENOUS | Status: DC

## 2024-02-18 MED ORDER — OXYTOCIN 10 UNIT/ML IJ SOLN
INTRAMUSCULAR | Status: AC
  Filled 2024-02-18: qty 2

## 2024-02-18 MED ORDER — PHENYLEPHRINE 80 MCG/ML (10ML) SYRINGE FOR IV PUSH (FOR BLOOD PRESSURE SUPPORT): 80.0000 ug | PREFILLED_SYRINGE | INTRAVENOUS | Status: DC | PRN

## 2024-02-18 MED ORDER — OXYTOCIN-SODIUM CHLORIDE 30-0.9 UT/500ML-% IV SOLN
INTRAVENOUS | Status: AC
  Administered 2024-02-18: 333 mL via INTRAVENOUS
  Filled 2024-02-18: qty 500

## 2024-02-18 MED ORDER — COCONUT OIL OIL
1.0000 | TOPICAL_OIL | Status: DC | PRN
  Administered 2024-02-19: 1 via TOPICAL
  Filled 2024-02-18: qty 7.5

## 2024-02-18 MED ORDER — ACETAMINOPHEN 325 MG PO TABS
650.0000 mg | ORAL_TABLET | ORAL | Status: DC | PRN
Start: 2024-02-18 — End: 2024-02-19
  Administered 2024-02-18: 650 mg via ORAL
  Filled 2024-02-18: qty 2

## 2024-02-18 MED ORDER — ZOLPIDEM TARTRATE 5 MG PO TABS: 5.0000 mg | ORAL_TABLET | Freq: Every evening | ORAL | Status: DC | PRN

## 2024-02-18 MED ORDER — DIBUCAINE (PERIANAL) 1 % EX OINT: 1.0000 | TOPICAL_OINTMENT | CUTANEOUS | Status: DC | PRN

## 2024-02-18 MED ORDER — MISOPROSTOL 200 MCG PO TABS
ORAL_TABLET | ORAL | Status: AC
  Filled 2024-02-18: qty 4

## 2024-02-18 MED ORDER — IBUPROFEN 600 MG PO TABS
600.0000 mg | ORAL_TABLET | Freq: Four times a day (QID) | ORAL | Status: DC
  Administered 2024-02-18 – 2024-02-19 (×3): 600 mg via ORAL
  Filled 2024-02-18 (×3): qty 1

## 2024-02-18 MED ORDER — LIDOCAINE HCL (PF) 1 % IJ SOLN: 30.0000 mL | INTRAMUSCULAR | Status: DC | PRN

## 2024-02-18 MED ORDER — SENNOSIDES-DOCUSATE SODIUM 8.6-50 MG PO TABS
2.0000 | ORAL_TABLET | Freq: Every day | ORAL | Status: DC
  Administered 2024-02-19: 2 via ORAL
  Filled 2024-02-18: qty 2

## 2024-02-18 MED ORDER — TRANEXAMIC ACID-NACL 1000-0.7 MG/100ML-% IV SOLN
INTRAVENOUS | Status: AC
  Filled 2024-02-18: qty 100

## 2024-02-18 MED ORDER — ACETAMINOPHEN 325 MG PO TABS: 650.0000 mg | ORAL_TABLET | ORAL | Status: DC | PRN

## 2024-02-18 MED ORDER — FENTANYL-BUPIVACAINE-NACL 0.5-0.125-0.9 MG/250ML-% EP SOLN: 12.0000 mL/h | EPIDURAL | Status: DC | PRN

## 2024-02-18 MED ORDER — AMMONIA AROMATIC IN INHA
RESPIRATORY_TRACT | Status: AC
  Filled 2024-02-18: qty 10

## 2024-02-18 MED ORDER — ONDANSETRON HCL 4 MG PO TABS: 4.0000 mg | ORAL_TABLET | ORAL | Status: DC | PRN

## 2024-02-18 MED ORDER — HYDROXYZINE HCL 25 MG PO TABS: 50.0000 mg | ORAL_TABLET | Freq: Four times a day (QID) | ORAL | Status: DC | PRN

## 2024-02-18 MED ORDER — SIMETHICONE 80 MG PO CHEW: 80.0000 mg | CHEWABLE_TABLET | ORAL | Status: DC | PRN

## 2024-02-18 MED ORDER — FENTANYL-BUPIVACAINE-NACL 0.5-0.125-0.9 MG/250ML-% EP SOLN
EPIDURAL | Status: AC
  Filled 2024-02-18: qty 250

## 2024-02-18 MED ORDER — OXYTOCIN BOLUS FROM INFUSION: 333.0000 mL | Freq: Once | INTRAVENOUS | Status: AC

## 2024-02-18 MED ORDER — LACTATED RINGERS IV SOLN: 500.0000 mL | INTRAVENOUS | Status: DC | PRN

## 2024-02-18 MED ORDER — METHYLERGONOVINE MALEATE 0.2 MG/ML IJ SOLN
INTRAMUSCULAR | Status: AC
  Filled 2024-02-18: qty 1

## 2024-02-18 MED ORDER — ONDANSETRON HCL 4 MG/2ML IJ SOLN: 4.0000 mg | INTRAMUSCULAR | Status: DC | PRN

## 2024-02-18 MED ORDER — WITCH HAZEL-GLYCERIN EX PADS
1.0000 | MEDICATED_PAD | CUTANEOUS | Status: DC | PRN
Start: 2024-02-18 — End: 2024-02-19

## 2024-02-18 MED ORDER — EPHEDRINE 5 MG/ML INJ: 10.0000 mg | INTRAVENOUS | Status: DC | PRN

## 2024-02-18 MED ORDER — PRENATAL MULTIVITAMIN CH
1.0000 | ORAL_TABLET | Freq: Every day | ORAL | Status: DC
  Administered 2024-02-19: 1 via ORAL
  Filled 2024-02-18: qty 1

## 2024-02-18 MED ORDER — BENZOCAINE-MENTHOL 20-0.5 % EX AERO
1.0000 | INHALATION_SPRAY | CUTANEOUS | Status: DC | PRN
Start: 2024-02-18 — End: 2024-02-19
  Administered 2024-02-18: 1 via TOPICAL
  Filled 2024-02-18: qty 56

## 2024-02-18 MED ORDER — FENTANYL CITRATE (PF) 100 MCG/2ML IJ SOLN: 50.0000 ug | INTRAMUSCULAR | Status: DC | PRN

## 2024-02-18 MED ORDER — OXYTOCIN-SODIUM CHLORIDE 30-0.9 UT/500ML-% IV SOLN
2.5000 [IU]/h | INTRAVENOUS | Status: DC
  Administered 2024-02-18: 2.5 [IU]/h via INTRAVENOUS

## 2024-02-18 MED ORDER — DIPHENHYDRAMINE HCL 25 MG PO CAPS: 25.0000 mg | ORAL_CAPSULE | Freq: Four times a day (QID) | ORAL | Status: DC | PRN

## 2024-02-18 NOTE — Progress Notes (Signed)
 Patient consented to SVE  7cm/100%/-2, vertex, BBOW

## 2024-02-18 NOTE — Lactation Note (Signed)
 This note was copied from a baby's chart. Lactation Consultation Note  Patient Name: Shirley Turner Unijb'd Date: 02/18/2024 Age:34 hours Reason for consult: Initial assessment;Term   Maternal Data Has patient been taught Hand Expression?: Yes Does the patient have breastfeeding experience prior to this delivery?: Yes  Lactation to room for an initial assessment and to assist with a feeding.  This was a SVD.  Patient w/ a hx of anemia, tobacco use in first trimester, MJ use in pregnancy, and adjustment disorder w/ mixed anxiety.  Patient stated that she breastfed her other children w/ no issues.  She does not have a pump at home.  Feeding Mother's Current Feeding Choice: Breast Milk  LC assisted patient w/ an attempted feeding at the breast.  Mom has good positioning of infant in football hold.  Patient was able to hand express several drops of milk out and this was finger fed to infant.  LC was able to syringe feed infant .6ml of EBM.    LC had patient put infant STS and will return in 30 minutes to assist w/ a feeding.  Lactation Tools Discussed/Used Lactation provided patient w/ a Medela Manual Breastpump. No education was provided to patient yet. This will be done prior to discharge.  Interventions Interventions: Breast feeding basics reviewed;Adjust position;Support pillows;Position options;Education  LC provided education on the following;  milk production expectations, hunger cues, day 1/2 wet/dirty diapers, hand expression, cluster feeding, benefits of STS and arousing infant for a feeding.  Lactation informed patient of feeding infant at least 8 or more times w/in a 24hr period but not exceeding 3hrs. Patient verbalized understanding.   Discharge Pump: Advised to call insurance company [facility] Program: Yes  Consult Status Consult Status: Follow-up Follow-up type: In-patient    Duwane Gewirtz S Marc Sivertsen 02/18/2024, 3:21 PM

## 2024-02-18 NOTE — H&P (Signed)
 History and Physical   HPI  Shirley Turner is a 33 y.o. G4P3003 at [redacted]w[redacted]d Estimated Date of Delivery: 02/22/24 by LMP c/w 8wk u/s, who is being admitted for labor management. Had membrane sweep in clinic yesterday, then reports contractions all throughout the night. Reports good fetal movement. Denies VB, LOF, HA, visual changes or RUQ pain. Shirley Turner reports feeling like she needs to push.  Shirley Turner started prenatal care at 7 weeks. Had a lapse in care from 33 weeks until 38 weeks. Care was complicated by anemia (missed iron  infusion appt, last hgb was 8.8 on 01/09/24), depression (edinburgh score of 21, declined medication), rubella non immune status, elevated 1 hr GTT but 3 hr WNL, poor weight gain in pregnancy, and positive UDS at [facility] (for THC and nicotine , no repeat UDS).   OB History  OB History  Gravida Para Term Preterm AB Living  4 3 3  0 0 3  SAB IAB Ectopic Multiple Live Births  0 0 0 0 3    # Outcome Date GA Lbr Len/2nd Weight Sex Type Anes PTL Lv  4 Current           3 Term 08/13/21 [redacted]w[redacted]d 08:50 / 00:01 2685 g M Vag-Spont EPI  LIV     Name: Shirley Turner     Apgar1: 8  Apgar5: 9  2 Term 01/30/16 [redacted]w[redacted]d   F Vag-Spont   LIV  1 Term 10/03/14    F Vag-Spont  N LIV    PROBLEM LIST  Pregnancy complications or risks: Patient Active Problem List   Diagnosis Date Noted   Uterine contractions 02/18/2024   Indication for care in labor and delivery, antepartum 01/09/2024   Anemia affecting pregnancy 12/09/2023   Poor weight gain of pregnancy 12/05/2023   Low back pain during pregnancy in second trimester 10/10/2023   Rubella non-immune status, antepartum 08/14/2023   Maternal tobacco use in first trimester 08/13/2023   Marijuana use during pregnancy 07/03/2021   Supervision of other normal pregnancy, antepartum 02/11/2021   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   Asthma 06/15/2019   Alcohol use 8 shots Henessey 04/2019 06/15/2019    Adjustment disorder with mixed anxiety and depressed mood 02/28/2019   Cocaine use disorder, moderate, dependence (HCC)--last use 04/09/19 06/15/2018    Prenatal labs and studies: ABO, Rh: B/Positive/-- (01/21 1514) Antibody: Negative (01/21 1514) Rubella: <0.90 (01/21 1514) RPR: Non Reactive (05/15 1125)  HBsAg: Negative (01/21 1514)  HIV: Non Reactive (05/15 1125)  GBS: pending 1hr GTT elevated, 3 hr WNL Varicella:reactive  GC/CT: neg/neg H&H:  Hemoglobin  Date Value Ref Range Status  01/09/2024 8.8 (L) 12.0 - 15.0 g/dL Final  94/84/7974 89.6 (L) 11.1 - 15.9 g/dL Final  98/78/7974 87.7 11.1 - 15.9 g/dL Final  87/81/7975 88.4 (L) 12.0 - 15.0 g/dL Final  98/77/7976 87.4 12.0 - 15.0 g/dL Final  98/80/7976 87.8 11.1 - 15.9 g/dL Final  88/69/7977 9.5 (L) 11.1 - 15.9 g/dL Final      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 Qty: 1 each, Refills: 1    Ferric Maltol  (ACCRUFER ) 30 MG CAPS Take 1 capsule (30 mg total) by mouth in the morning and at bedtime. Qty: 60 capsule, Refills: 6   Associated Diagnoses: Anemia affecting pregnancy in third trimester    hydrOXYzine  (ATARAX ) 50 MG tablet Take 1 tablet (50 mg total) by mouth every 6 (six) hours as needed for anxiety. Qty: 30 tablet, Refills: 0    Misc. Devices (DDS BELT/AMBULATORY SPINAL-AIR) MISC  Pregnancy lumbar support belt/band Qty: 1 each, Refills: 0    ondansetron  (ZOFRAN ) 4 MG/2ML SOLN injection Inject 2 mLs (4 mg total) into the vein every 6 (six) hours as needed for nausea or vomiting. Qty: 2 mL, Refills: 0    ondansetron  (ZOFRAN -ODT) 4 MG disintegrating tablet Take 1 tablet (4 mg total) by mouth every 8 (eight) hours as needed. Qty: 30 tablet, Refills: 1   Associated Diagnoses: Initial obstetric visit in first trimester; Nausea and vomiting in pregnancy    potassium chloride  (KLOR-CON  M) 20 MEQ tablet Take 1 tablet (20 mEq total) by mouth 2 (two) times daily for 5 days. Qty: 10 tablet, Refills: 0    Prenatal Vit-Fe Fumarate-FA (PRENATAL MULTIVITAMIN) TABS tablet Take 1 tablet by mouth daily at 12 noon.         Allergies  Patient has no known allergies.  Review of Systems  Pertinent items are noted in HPI.  Physical Exam  BP (!) 115/54 (BP Location: Right Arm)   Pulse 87   Resp 18   Ht 5' 3 (1.6 m)   Wt 75 kg   LMP 05/18/2023 (Exact Date)   BMI 29.29 kg/m   Lungs:  CTA B Cardio: S1S2, RRR Abd: Soft, gravid, NT Presentation: cephalic by leopolds, was cephalic in clinic yesterday DTRs: 2+ B SVE: patient declined  FHR 125, mod variability, pos accels, no decels Toco Ucs q3-58min   Test Results  No results found for this or any previous visit (from the past 24 hours). GBS pending  Assessment  G4P3003 at [redacted]w[redacted]d Estimated Date of Delivery: 02/22/24  RNST active labor GBS unk  Patient Active Problem List   Diagnosis Date Noted   Uterine contractions 02/18/2024   Indication for care in labor and delivery, antepartum 01/09/2024   Anemia affecting pregnancy 12/09/2023   Poor weight gain of pregnancy 12/05/2023   Low back pain during pregnancy in second trimester 10/10/2023   Rubella non-immune status, antepartum 08/14/2023   Maternal tobacco use in first trimester 08/13/2023   Marijuana use during pregnancy 07/03/2021   Supervision of other  normal pregnancy, antepartum 02/11/2021   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 Admit to L&D   2. EFM per unit policy 3. Labs : T&S, CBC, RPR, UDS, GBS PCR 4. Dr. Janit notified of patient admission and status  5. Expectant management  [name], CNM, FNP 02/18/2024 8:34 AM

## 2024-02-18 NOTE — Discharge Summary (Signed)
     OB Discharge Summary     Patient Name: Shirley Turner DOB: 24-May-1991 MRN: [id number]  Date of admission: 02/18/2024 Delivering MD: [name], CNM  Date of Delivery: 02/18/2024  Date of discharge: 02/18/2024  Admitting diagnosis: Uterine contractions [O47.9] Intrauterine pregnancy: [redacted]w[redacted]d     Secondary diagnosis: Anemia     Discharge diagnosis: Term Pregnancy Delivered and Anemia                                                                                                Post partum procedures:{Postpartum procedures:23558}  Augmentation: AROM  Complications: None  Hospital course:  Onset of Labor With Vaginal Delivery      33 y.o. yo H5E5995 at [redacted]w[redacted]d was admitted in Active Labor on 02/18/2024. Labor course was complicated by none  Membrane Rupture Time/Date: 9:36 AM,02/18/2024  Delivery Method:Vaginal, Spontaneous Operative Delivery:N/A Episiotomy: None Lacerations:  1st degree  Patient had a postpartum course complicated by ***.  She is ambulating, tolerating a regular diet, passing flatus, and urinating well. Patient is discharged home in stable condition on 02/18/24.  Newborn Data: Birth date:02/18/2024 Birth time:10:14 AM Gender:Female Living status:Living Apgars:8 ,9  Weight:   Subjective:   Physical exam  Vitals:   02/18/24 1100 02/18/24 1115 02/18/24 1130 02/18/24 1145  BP: 110/79 119/71 114/65 129/78  Pulse: 84 88 88 80  Resp:      Temp:      TempSrc:      Weight:      Height:       General: {Exam; general:21111117} Breast: soft, non-tender, nipples without breakdown Lochia: {Desc; appropriate/inappropriate:30686::appropriate} Uterine Fundus: {Desc; firm/soft:30687} Perineum: no erythema or foul odor discharge, minimal edema DVT Evaluation: {Exam; dvt:2111122}  Labs: Lab Results  Component Value Date   WBC 8.8 02/18/2024   HGB 10.8 (L) 02/18/2024   HCT 32.2 (L) 02/18/2024   MCV 88.7 02/18/2024   PLT 328 02/18/2024     Discharge instruction: in After Visit Summary.  Medications:  Allergies as of 02/18/2024   No Known Allergies   Med Rec must be completed prior to using this SMARTLINK***        Activity: Advance as tolerated. Pelvic rest for 6 weeks.   Outpatient follow up:  Follow-up Information     [facility] Follow up.   Specialty: Obstetrics and Gynecology Why: call to schedule a two week telehealth visit and six week in person postpartum visit Contact information: [address] [place]  [phone]                  Postpartum contraception: {Contraceptives:21111124} Rhogam Given postpartum: not indicated Rubella vaccine given postpartum: {YES NO:22349} Varicella vaccine given postpartum: no indicated TDaP given antepartum or postpartum: declined  Newborn Data: Live born female  Birth Weight:   APGAR: 8, 9  Newborn Delivery   Birth date/time: 02/18/2024 10:14:00 Delivery type: Vaginal, Spontaneous      Baby Feeding: {Baby feeding:23562}  Disposition:{CHL IP OB HOME WITH FNUYZM:76418}   [name], CNM, FNP 02/18/2024 12:08 PM

## 2024-02-18 NOTE — Lactation Note (Signed)
 This note was copied from a baby's chart. Lactation Consultation Note  Patient Name: Shirley Turner Unijb'd Date: 02/18/2024 Age:34 hours Reason for consult: Follow-up assessment   Maternal Data Has patient been taught Hand Expression?: Yes Does the patient have breastfeeding experience prior to this delivery?: Yes  Lactation to room to assist patient w/ feeding of donor breast milk.    Feeding Mother's Current Feeding Choice: Breast Milk  LC was able to syringe feed infant 1ml of donor breastmilk.    Interventions Interventions: Education LC provided education on storage and how to feeding infant.  Encouraged patient to offer infant donor milk from bottle w/in an hour. After changing of diaper. Parents verbalized understanding.  Discharge Pump: Advised to call insurance company [facility] Program: Yes  Consult Status Consult Status: Follow-up Follow-up type: In-patient    Danyele Smejkal S Jasmyne Lodato 02/18/2024, 5:09 PM

## 2024-02-19 LAB — CBC
HCT: 25.4 % — ABNORMAL LOW (ref 36.0–46.0)
Hemoglobin: 8.5 g/dL — ABNORMAL LOW (ref 12.0–15.0)
MCH: 30.2 pg (ref 26.0–34.0)
MCHC: 33.5 g/dL (ref 30.0–36.0)
MCV: 90.4 fL (ref 80.0–100.0)
Platelets: 239 K/uL (ref 150–400)
RBC: 2.81 MIL/uL — ABNORMAL LOW (ref 3.87–5.11)
RDW: 13.7 % (ref 11.5–15.5)
WBC: 11.1 K/uL — ABNORMAL HIGH (ref 4.0–10.5)
nRBC: 0 % (ref 0.0–0.2)

## 2024-02-19 LAB — STREP GP B NAA: Strep Gp B NAA: NEGATIVE

## 2024-02-19 LAB — RPR: RPR Ser Ql: NONREACTIVE

## 2024-02-19 MED ORDER — ACETAMINOPHEN 325 MG PO TABS: 650.0000 mg | ORAL_TABLET | ORAL | Status: AC | PRN

## 2024-02-19 MED ORDER — IBUPROFEN 600 MG PO TABS
600.0000 mg | ORAL_TABLET | Freq: Four times a day (QID) | ORAL | Status: DC
  Administered 2024-02-19 (×2): 600 mg via ORAL
  Filled 2024-02-19 (×2): qty 1

## 2024-02-19 MED ORDER — IBUPROFEN 600 MG PO TABS: 600.0000 mg | ORAL_TABLET | Freq: Four times a day (QID) | ORAL | 0 refills | Status: AC

## 2024-02-19 MED ORDER — CALCIUM CARBONATE ANTACID 500 MG PO CHEW
2.0000 | CHEWABLE_TABLET | Freq: Three times a day (TID) | ORAL | Status: DC | PRN
  Administered 2024-02-19: 400 mg via ORAL
  Filled 2024-02-19: qty 2

## 2024-02-19 NOTE — Progress Notes (Signed)
 Patient discharged home with family.  Patient will be escorted out by auxiliary.   Elyn Sharps, RN 02/19/24 @1425 

## 2024-02-19 NOTE — Progress Notes (Signed)
 RN in room to assess pt; pt just finished a breast feed and wants to eat her food; RN warmed pt's food; RN offered to swaddle baby and place baby in bassinet while pt eats; pt said no, she's good; RN did remind pt about infant safe sleep; RN reminded pt that we don't recommend co-sleeping with her infant; RN reminded pt that if/when she starts to get sleepy, and needs assistance placing baby in bassinet, to please call; pt said ok

## 2024-02-19 NOTE — Discharge Instructions (Signed)

## 2024-02-19 NOTE — Clinical Social Work Maternal (Signed)
 CLINICAL SOCIAL WORK MATERNAL/CHILD NOTE   Patient Details  Name: Shirley Turner MRN: [id number] Date of Birth: 10/17/1990   Date:  08/13/23   Clinical Social Worker Initiating Note:  Corrie Ruts Date/Time: Initiated:  09-Feb-2024/1000               Child's Name:  [name]    Biological Parents:  Mother    Need for Interpreter:  None    Reason for Referral:  Current Substance Use/Substance Use During Pregnancy      Address:  [address] [place] [address]    Phone number:  [phone] (home)      Additional phone number:    Household Members/Support Persons (HM/SP):   Household Member/Support Person 1, Household Member/Support Person 4, Household Member/Support Person 7, Household Member/Support Person 2     HM/SP Name Relationship DOB or Age  HM/SP -1 Debby Sayre FOB 40  HM/SP -2 Tazeir [employer] Son 2  HM/SP -3     HM/SP -4 Kymberlynn Regina Daughter 8  HM/SP -5     HM/SP -6     HM/SP -7 Sahyrah Beebe Daughter 9  HM/SP -8         Natural Supports (not living in the home):  Parent (Patient reports that her father, Linnae Rasool lives down the street and is very supportive.)    Professional Supports: Paramedic, Other (Comment) (Patient reports that she has a SUD Presenter, broadcasting.)    Employment: Environmental education officer    Type of Work: Counsellor Care    Education:  Other (comment) (Patient reports completing her GED.)    Homebound arranged:     Financial Resources:  Medicaid    Other Resources:  Sales executive   (Patient reports that she will apply for [facility] for her two two youngest children.)    Cultural/Religious Considerations Which May Impact Care:     Strengths:  Ability to meet basic needs  , Compliance with medical plan  , Pediatrician chosen, Home prepared for child      Psychotropic Medications:          Pediatrician:    [employer]   Pediatrician List:    [facility] Other  (Patient reports that she will use Wilfred Blamer for the babys pediatrician.)  [facility]       Pediatrician Fax Number:     Risk Factors/Current Problems:  Substance Use      Cognitive State:  Alert      Mood/Affect:  Calm  , Comfortable  , Happy  , Interested      CSW Assessment:  Chart reviewed. I spoke with Charmaine Virgia today at bedside. I introduced myself, my role, and reason for consult.   I consulted with the patient about positive toxicology seen for [facility].   Ms. Dechert reported that she felt great after her delivery of baby girl [place].he patient confirmed that her address is [address], [place], [address] and her telephone number [phone]. The patient reports that the father of the baby is Debby Sayre (40). Ms. Shutes also reports that her father, Rihana Kiddy lives down the street and is very supportive.    The patient reports that her highest level of education is GED completion and that she is currently employed with [employer].    Ms. Kellison reports living with her  four children. Sahyrah Minkoff (90, Kymberlynn Ravan (8), Tazeir Malone (2), and Tasiri Malone(2 days). The patient reports that she does receive food stamps and will apply for [facility] in [place] for her two younger children.    The patient reports that she has a diagnosis of depression and anxiety. The patient reports that she did not take her Hydroxyzine while she was pregnant but she will start back. Ms. Lansing also reports having a therapist and SUD counselor [facility] [place]. The patient reports that the clinicians names were Caitlyn and Clancy and she sees them weekly. The patient reports that she has not been in a while and will follow up with them when the baby is 2 weeks because she physically has to go into the office. Ms. Messner also reports that she has a Engineer, drilling who she has to report to.    Ms. Kleier denies any past  or current SI/HI/DV.    I reviewed information on Post partum depression, Sudden Infant Death Syndrome, Safe Sleep environment, Car seat safety, breast feeding and marijuana use. The patient verbalized understanding.   The patient reports that the baby will access care with Wilfred Blamer has a follow up appointment this Friday.    I then informed the patient about the law and hospital policy on positive toxicology screens and reporting to CPS. The patient verbalized understanding.    Ms. Primmer reports that she smoked marijuana a couple of weeks ago and she has had CPS consults with all of her children because of her substance use but cases were never open. The patient reports that her father, Anistyn Graddy will help transport her during discharge.    The patient had no questions or concerns.  I have made the DSS report with Starr Stager with [place] CPS.    The patient and the baby will be discharged today.                CSW Plan/Description:  Child Protective Service Report  , Hospital Drug Screen Policy Information, Perinatal Mood and Anxiety Disorder (PMADs) Education, Sudden Infant Death Syndrome (SIDS) Education      Corrie JINNY Ruts, LCSW 03/25/2024, 11:53 AM

## 2024-02-19 NOTE — Lactation Note (Signed)
 This note was copied from a baby's chart. Lactation Consultation Note  Patient Name: Shirley Turner Unijb'd Date: 02/19/2024 Age:34 hours Reason for consult: Follow-up assessment;Term;Other (Comment) (Discharge Education)   Maternal Data Follow up assessment and discharge education w/ a 26hr old baby Shirley and patient.  Patient expressed that feedings are going great.  She feels good and confident.  Feeding Mother's Current Feeding Choice: Breast Milk  LC observed a feeding at the breast.  Patient independently latched infant.  LC made one minor adjustment by having infant come in closer to mom during the feeding.  And making sure infants tummy is facing mom.  LATCH Score Latch: Grasps breast easily, tongue down, lips flanged, rhythmical sucking.  Audible Swallowing: A few with stimulation  Type of Nipple: Everted at rest and after stimulation  Comfort (Breast/Nipple): Soft / non-tender  Hold (Positioning): No assistance needed to correctly position infant at breast.  LATCH Score: 9  Interventions Interventions: Breast feeding basics reviewed;Adjust position;Education;CDC milk storage guidelines  Discharge Discharge Education: Engorgement and breast care;Warning signs for feeding baby;Outpatient recommendation  Education on engorgement prevention/treatment was discussed as well as breastmilk storage guidelines.  LC provided patient with a handout on breastmilk storage guidelines from [facility]. [facility] outpatient lactation services phone number written on the white board in the room.  Patient verbalized understanding.  LC also provided education from the postpartum book about warning signs to look for in a poor feeding.    Consult Status Consult Status: Complete Follow-up type: Call as needed    Natnael Biederman S Tyvon Eggenberger 02/19/2024, 12:19 PM

## 2024-02-19 NOTE — Plan of Care (Signed)
 Discharge instructions, when to follow up, and prescriptions reviewed with patient.  Patient verbalized understanding. Elyn Sharps, RN 02/19/24 @1340 

## 2024-02-24 ENCOUNTER — Encounter: Admitting: Obstetrics & Gynecology

## 2024-03-03 DIAGNOSIS — Z419 Encounter for procedure for purposes other than remedying health state, unspecified: Secondary | ICD-10-CM | POA: Diagnosis not present

## 2024-03-04 ENCOUNTER — Telehealth (INDEPENDENT_AMBULATORY_CARE_PROVIDER_SITE_OTHER): Admitting: Licensed Practical Nurse

## 2024-03-04 DIAGNOSIS — Z1332 Encounter for screening for maternal depression: Secondary | ICD-10-CM

## 2024-03-04 NOTE — Progress Notes (Signed)
 Virtual Visit via Video Note  I connected with Shirley Turner on 03/04/24 at  2:55 PM EDT by a video enabled telemedicine application and verified that I am speaking with the correct person using two identifiers.  Location: Patient: home in [place] Provider: office in [place], [place]    I discussed the limitations of evaluation and management by telemedicine and the availability of in person appointments. The patient expressed understanding and agreed to proceed.  History of Present Illness: SVB 7/29 with Shirley Turner, small first degree not repaired. Baby girl  Shirley Turner 2830 grams  -Pleased with birth experience, felt well cared for  -Bleeding: slowed but picked up for a day and now light again.  -Sleep is pretty good gets 8-10 hours in a 24 hour period.  -Appetite is good, has increased -Breastfeeding is going well, nipples are occasionally sore. Pumps about every 3 hours gets 4 to 5 ounces, taking liquid gold supplements. -Has hemorrhoids that are bothersome, has been soaking in the tub but does not have any OTC products-will get some. Is afraid to have a BM so holds back some when she needs to use the BR.  -Home with her children, the FOB is not very present. Her father comes every few days to help out. She has a friend that she speaks to on the phone daily for support.  -Mood: admits to being overwhelmed but also in a good place. She realized she is better without the FOB present. Feels like postpartum is creeping in, has not seen her therapist yet, will see her soon. Continues to have anxiety-feels numbness when it occurs.  Has not smoked MJ since giving birth, she feels this is helping her mood as MJ would blunt her mood. Has a strong desire to not use MJ.  -Probably will not have any future pregnancies, would be open to more if she were to ever marry. Does not like hormonal contraception, has always used an app to track cycles. She usually gets a cycle quickly after  giving birth. Right now she does not intend to have IC for a long time.     Observations/Objective: GEN: NAD EPDS 5   Assessment and Plan: Normal exam of lactating mother  Screening for maternal depression   Follow Up Instructions: Overall Shirley Turner is doing well emotionally-compared to a few weeks ago, will make contact with her therapist.  Shirley Turner to increase physical activity  May use preparation H, tucks and colace  RTC 4 weeks    I discussed the assessment and treatment plan with the patient. The patient was provided an opportunity to ask questions and all were answered. The patient agreed with the plan and demonstrated an understanding of the instructions.   The patient was advised to call back or seek an in-person evaluation if the symptoms worsen or if the condition fails to improve as anticipated.  I provided 20 minutes of non-face-to-face time during this encounter.   Shirley Turner Shirley Turner, CNM

## 2024-03-31 ENCOUNTER — Other Ambulatory Visit (HOSPITAL_COMMUNITY)
Admission: RE | Admit: 2024-03-31 | Discharge: 2024-03-31 | Disposition: A | Discharge status: Home / Self Care | Source: Ambulatory Visit | Attending: Licensed Practical Nurse | Admitting: Licensed Practical Nurse

## 2024-03-31 ENCOUNTER — Ambulatory Visit: Admitting: Licensed Practical Nurse

## 2024-03-31 DIAGNOSIS — K5900 Constipation, unspecified: Secondary | ICD-10-CM

## 2024-03-31 DIAGNOSIS — Z124 Encounter for screening for malignant neoplasm of cervix: Secondary | ICD-10-CM

## 2024-03-31 DIAGNOSIS — K648 Other hemorrhoids: Secondary | ICD-10-CM | POA: Diagnosis not present

## 2024-03-31 MED ORDER — DOCUSATE SODIUM 100 MG PO CAPS: 100.0000 mg | ORAL_CAPSULE | Freq: Two times a day (BID) | ORAL | 3 refills | Status: AC | PRN

## 2024-03-31 MED ORDER — HYDROCORTISONE ACETATE 25 MG RE SUPP
25.0000 mg | Freq: Two times a day (BID) | RECTAL | 1 refills | Status: AC
Start: 2024-03-31 — End: ?

## 2024-03-31 NOTE — Progress Notes (Unsigned)
 Postpartum Visit  Chief Complaint:  Chief Complaint  Patient presents with  . Postpartum Care    History of Present Illness: Patient is a 33 y.o. H5E5995 presents for postpartum visit. Here with her son and newborn  SVB 7/29 with [name], small first degree not repaired. Baby girl  [name] 2830 grams   -Pleased with birth experience, felt well cared for  -Bleeding:   -Sleep is pretty good gets 8-10 hours in a 24 hour period.  -Appetite is good, has increased -Breastfeeding is going well, nipples are occasionally sore. Pumps about every 3 hours gets 4 to 5 ounces, taking liquid gold supplements. -Has hemorrhoids that are bothersome, has been soaking in the tub but does not have any OTC products-will get some. Is afraid to have a BM so holds back some when she needs to use the BR.  -Home with her children, the FOB is not very present. Her father comes every few days to help out. She has a friend that she speaks to on the phone daily for support.  -Mood: admits to being overwhelmed but also in a good place. She realized she is better without the FOB present. Feels like postpartum is creeping in, has not seen her therapist yet, will see her soon. Continues to have anxiety-feels numbness when it occurs.  Has not smoked MJ since giving birth, she feels this is helping her mood as MJ would blunt her mood. Has a strong desire to not use MJ.  -Probably will not have any future pregnancies, would be open to more if she were to ever marry. Does not like hormonal contraception, has always used an app to track cycles.    Post partum depression/anxiety noted:  maybe Edinburgh Post-Partum Depression Score:  9  Date of last PAP: 2022  normal   Past Medical History:  Diagnosis Date  . 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 Anemia   . Anxiety   . Asthma   . Depression   . Eczema   . Fibroid   . Headache   . 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 Vaginal delivery 08/13/2021    Past Surgical History:  Procedure Laterality Date  . TONSILLECTOMY AND ADENOIDECTOMY      Prior to Admission medications   Medication Sig Start Date End Date Taking? Authorizing Provider  docusate sodium  (COLACE) 100 MG capsule Take 1 capsule (100 mg total) by mouth 2 (two) times daily as needed. 03/31/24  Yes Shadonna Benedick, Jinnie Jansky, CNM  hydrocortisone  (ANUSOL -HC) 25 MG suppository Place 1 suppository (25 mg total) rectally 2 (two) times daily. 03/31/24  Yes Aurther Harlin, Jinnie Jansky, CNM  acetaminophen  (TYLENOL ) 325 MG tablet Take 2 tablets (650 mg total) by mouth every 4 (four) hours as needed (for pain scale < 4). Patient not taking: Reported on 03/04/2024 02/19/24   Slaughterbeck, Damien, CNM  albuterol  (VENTOLIN  HFA) 108 (90 Base) MCG/ACT inhaler Inhale 2 puffs into the lungs every 6 (six) hours as needed for wheezing or shortness of breath. 11/22/23   Free, Lauraine PARAS, CNM  Ferric Maltol  (ACCRUFER ) 30 MG CAPS Take 1 capsule (30 mg total) by mouth in the morning and at bedtime. 12/09/23   Free, Lauraine PARAS, CNM  hydrOXYzine  (ATARAX ) 50 MG tablet Take 1 tablet (50 mg total) by mouth every 6 (six) hours as needed for anxiety. 01/09/24   Deadra Diggins, Jinnie Jansky, CNM  ibuprofen  (ADVIL ) 600 MG tablet Take 1 tablet (600  mg total) by mouth every 6 (six) hours. Patient not taking: Reported on 03/04/2024 02/19/24   Slaughterbeck, Damien, CNM  Prenatal Vit-Fe Fumarate-FA (PRENATAL MULTIVITAMIN) TABS tablet Take 1 tablet by mouth daily at 12 noon. Patient not taking: Reported on 03/04/2024    [provider]    No Known Allergies   Social History   Socioeconomic History  . Marital status: Single    Spouse name: Not on file  . Number of children: 3  . Years of education: Not on file  . Highest education level: GED or equivalent  Occupational History  . Not on file  Tobacco Use  . Smoking status: Every Day    Current packs/day:  0.25    Types: Cigarettes  . Smokeless tobacco: Never  Vaping Use  . Vaping status: Never Used  Substance and Sexual Activity  . Alcohol use: Not Currently    Alcohol/week: 8.0 standard drinks of alcohol    Types: 8 Shots of liquor per week  . Drug use: Yes    Frequency: 4.0 times per week    Types: Marijuana    Comment: recently stopped  . Sexual activity: Yes    Partners: Male    Birth control/protection: None  Other Topics Concern  . Not on file  Social History Narrative  . Not on file   Social Drivers of Health   Financial Resource Strain: Low Risk  (07/26/2023)   Overall Financial Resource Strain (CARDIA)   . Difficulty of Paying Living Expenses: Not very hard  Food Insecurity: No Food Insecurity (02/18/2024)   Hunger Vital Sign   . Worried About Programme researcher, broadcasting/film/video in the Last Year: Never true   . Ran Out of Food in the Last Year: Never true  Transportation Needs: No Transportation Needs (02/18/2024)   PRAPARE - Transportation   . Lack of Transportation (Medical): No   . Lack of Transportation (Non-Medical): No  Physical Activity: Inactive (07/26/2023)   Exercise Vital Sign   . Days of Exercise per Week: 0 days   . Minutes of Exercise per Session: 0 min  Stress: No Stress Concern Present (07/26/2023)   [employer] of Occupational Health - Occupational Stress Questionnaire   . Feeling of Stress : Only a little  Social Connections: Socially Isolated (07/26/2023)   Social Connection and Isolation Panel   . Frequency of Communication with Friends and Family: More than three times a week   . Frequency of Social Gatherings with Friends and Family: Never   . Attends Religious Services: Never   . Active Member of Clubs or Organizations: No   . Attends Banker Meetings: Never   . Marital Status: Never married  Intimate Partner Violence: Patient Unable To Answer (02/18/2024)   Humiliation, Afraid, Rape, and Kick questionnaire   . Fear of Current or Ex-Partner:  Patient unable to answer   . Emotionally Abused: Patient unable to answer   . Physically Abused: Patient unable to answer   . Sexually Abused: Patient unable to answer    Family History  Problem Relation Age of Onset  . Hypertension Mother   . Healthy Father   . Depression Sister   . Bipolar disorder Sister   . Schizophrenia Sister 16  . Diabetes Maternal Grandmother   . Hypertension Maternal Grandmother   . Kidney disease Maternal Grandmother   . Congestive Heart Failure Maternal Grandmother   . Diabetes Paternal Grandmother   . Hypertension Paternal Grandmother   . Arthritis Paternal Grandmother   .  ADD / ADHD Half-Sister     ROS  see HPI   Physical Exam BP (!) 140/97 (BP Location: Right Arm, Patient Position: Sitting, Cuff Size: Normal)   Pulse 89   Wt 149 lb 8 oz (67.8 kg)   Breastfeeding Yes   BMI 26.48 kg/m   Physical Exam Constitutional:      Appearance: Normal appearance.  Genitourinary:     Vulva normal.     Genitourinary Comments: SSE: cervix pink, no lesions  Bimanual exam: uterus non gravid, non tender not enlarged, adnexa non tender, no masses, not enlarged, no CMT  Cardiovascular:     Rate and Rhythm: Normal rate and regular rhythm.     Pulses: Normal pulses.     Heart sounds: Normal heart sounds.  Pulmonary:     Effort: Pulmonary effort is normal.     Breath sounds: Normal breath sounds.  Chest:     Comments: Breasts: no redness/discoloration or masses, nipples intact bilaterally  Musculoskeletal:     Cervical back: Normal range of motion and neck supple.     Right lower leg: No edema.     Left lower leg: No edema.  Neurological:     General: No focal deficit present.     Mental Status: She is alert.  Skin:    General: Skin is warm.  Psychiatric:        Mood and Affect: Mood normal.        Thought Content: Thought content normal.      Female Chaperone present during breast and/or pelvic exam.  Assessment: 33 y.o. H5E5995 presenting for  6 week postpartum visit  Plan: Problem List Items Addressed This Visit   None Visit Diagnoses       Other hemorrhoids    -  Primary   Relevant Medications   hydrocortisone  (ANUSOL -HC) 25 MG suppository     Constipation, unspecified constipation type       Relevant Medications   docusate sodium  (COLACE) 100 MG capsule        1) Contraception Education given regarding options for contraception, including barrier methods.  2)  Pap - ASCCP guidelines and rational discussed.  Patient opts for 41yr screening interval  3) Patient underwent screening for postpartum depression with some concerns noted.  4) Follow up 1 year for routine annual exam  Jinnie Cookey, CNM  [facility] 04/08/24  1:17 PM

## 2024-04-02 ENCOUNTER — Telehealth: Payer: Self-pay

## 2024-04-02 LAB — CYTOLOGY - PAP
Comment: NEGATIVE
Diagnosis: NEGATIVE
High risk HPV: NEGATIVE

## 2024-04-02 NOTE — Telephone Encounter (Signed)
 PA submitted.

## 2024-04-02 NOTE — Telephone Encounter (Signed)
 Shirley Turner

## 2024-04-03 DIAGNOSIS — Z419 Encounter for procedure for purposes other than remedying health state, unspecified: Secondary | ICD-10-CM | POA: Diagnosis not present
